# Patient Record
Sex: Female | Born: 1945 | Race: White | Smoking: Former smoker
Health system: Southern US, Community
[De-identification: ages and names within clinical notes are randomized; demographics above are authoritative.]

## PROBLEM LIST (undated history)

## (undated) DIAGNOSIS — F329 Major depressive disorder, single episode, unspecified: Secondary | ICD-10-CM

## (undated) DIAGNOSIS — E7800 Pure hypercholesterolemia, unspecified: Secondary | ICD-10-CM

## (undated) DIAGNOSIS — R569 Unspecified convulsions: Secondary | ICD-10-CM

## (undated) DIAGNOSIS — I639 Cerebral infarction, unspecified: Secondary | ICD-10-CM

## (undated) DIAGNOSIS — I10 Essential (primary) hypertension: Secondary | ICD-10-CM

## (undated) DIAGNOSIS — E119 Type 2 diabetes mellitus without complications: Secondary | ICD-10-CM

## (undated) DIAGNOSIS — F32A Depression, unspecified: Secondary | ICD-10-CM

## (undated) HISTORY — PX: CATARACT EXTRACTION: SUR2

## (undated) HISTORY — PX: ABDOMINAL HYSTERECTOMY: SHX81

## (undated) HISTORY — PX: EYE SURGERY: SHX253

## (undated) HISTORY — DX: Unspecified convulsions: R56.9

## (undated) HISTORY — PX: TOE AMPUTATION: SHX809

## (undated) HISTORY — DX: Pure hypercholesterolemia, unspecified: E78.00

## (undated) HISTORY — DX: Cerebral infarction, unspecified: I63.9

---
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
## 2009-08-07 ENCOUNTER — Other Ambulatory Visit: Admission: RE | Admit: 2009-08-07 | Discharge: 2009-08-07 | Payer: Self-pay | Admitting: Family Medicine

## 2009-12-12 ENCOUNTER — Encounter: Payer: Self-pay | Admitting: Emergency Medicine

## 2009-12-13 ENCOUNTER — Inpatient Hospital Stay (HOSPITAL_COMMUNITY): Admission: EM | Admit: 2009-12-13 | Discharge: 2009-12-14 | Payer: Self-pay | Admitting: Cardiovascular Disease

## 2009-12-14 ENCOUNTER — Ambulatory Visit: Payer: Self-pay | Admitting: Surgery

## 2009-12-14 ENCOUNTER — Encounter (INDEPENDENT_AMBULATORY_CARE_PROVIDER_SITE_OTHER): Payer: Self-pay | Admitting: Cardiovascular Disease

## 2010-01-13 ENCOUNTER — Emergency Department (HOSPITAL_COMMUNITY): Admission: EM | Admit: 2010-01-13 | Discharge: 2010-01-13 | Payer: Self-pay | Admitting: Emergency Medicine

## 2010-03-15 ENCOUNTER — Encounter: Admission: RE | Admit: 2010-03-15 | Discharge: 2010-03-15 | Payer: Self-pay | Admitting: Endocrinology

## 2010-04-24 ENCOUNTER — Encounter
Admission: RE | Admit: 2010-04-24 | Discharge: 2010-05-23 | Payer: Self-pay | Source: Home / Self Care | Attending: Neurology | Admitting: Neurology

## 2010-08-25 LAB — BASIC METABOLIC PANEL
BUN: 19 mg/dL (ref 6–23)
BUN: 30 mg/dL — ABNORMAL HIGH (ref 6–23)
CO2: 26 mEq/L (ref 19–32)
CO2: 30 mEq/L (ref 19–32)
Calcium: 8.8 mg/dL (ref 8.4–10.5)
Calcium: 10.3 mg/dL (ref 8.4–10.5)
Chloride: 109 mEq/L (ref 96–112)
Chloride: 103 mEq/L (ref 96–112)
Creatinine, Ser: 0.92 mg/dL (ref 0.4–1.2)
Creatinine, Ser: 0.93 mg/dL (ref 0.4–1.2)
GFR calc Af Amer: >60 mL/min (ref >60)
GFR calc Af Amer: >60 mL/min (ref >60)
GFR calc non Af Amer: >60 mL/min (ref >60)
GFR calc non Af Amer: >60 mL/min (ref >60)
Glucose, Bld: 144 mg/dL — ABNORMAL HIGH (ref 70–99)
Glucose, Bld: 132 mg/dL — ABNORMAL HIGH (ref 70–99)
Potassium: 3.8 mEq/L (ref 3.5–5.1)
Potassium: 3.5 mEq/L (ref 3.5–5.1)
Sodium: 142 mEq/L (ref 135–145)
Sodium: 142 mEq/L (ref 135–145)

## 2010-08-25 LAB — APTT: aPTT: 27 seconds (ref 24–37)

## 2010-08-25 LAB — DIFFERENTIAL
Basophils Absolute: 0 10*3/uL (ref 0.0–0.1)
Basophils Relative: 0 % (ref 0–1)
Eosinophils Absolute: 0.2 10*3/uL (ref 0.0–0.7)
Eosinophils Relative: 2 % (ref 0–5)
Lymphocytes Relative: 29 % (ref 12–46)
Lymphs Abs: 2.7 10*3/uL (ref 0.7–4.0)
Monocytes Absolute: 0.8 10*3/uL (ref 0.1–1.0)
Monocytes Relative: 8 % (ref 3–12)
Neutro Abs: 5.7 10*3/uL (ref 1.7–7.7)
Neutrophils Relative %: 61 % (ref 43–77)

## 2010-08-25 LAB — CBC
HCT: 35.2 % — ABNORMAL LOW (ref 36.0–46.0)
HCT: 38.6 % (ref 36.0–46.0)
HCT: 43.8 % (ref 36.0–46.0)
Hemoglobin: 11.6 g/dL — ABNORMAL LOW (ref 12.0–15.0)
Hemoglobin: 13.1 g/dL (ref 12.0–15.0)
Hemoglobin: 15.2 g/dL — ABNORMAL HIGH (ref 12.0–15.0)
MCH: 28.7 pg (ref 26.0–34.0)
MCH: 29.2 pg (ref 26.0–34.0)
MCH: 29.2 pg (ref 26.0–34.0)
MCHC: 33.1 g/dL (ref 30.0–36.0)
MCHC: 34 g/dL (ref 30.0–36.0)
MCHC: 34.6 g/dL (ref 30.0–36.0)
MCV: 86.1 fL (ref 78.0–100.0)
MCV: 86.7 fL (ref 78.0–100.0)
MCV: 84.3 fL (ref 78.0–100.0)
Platelets: 182 10*3/uL (ref 150–400)
Platelets: 187 10*3/uL (ref 150–400)
Platelets: 263 10*3/uL (ref 150–400)
RBC: 4.06 MIL/uL (ref 3.87–5.11)
RBC: 4.49 MIL/uL (ref 3.87–5.11)
RBC: 5.19 MIL/uL — ABNORMAL HIGH (ref 3.87–5.11)
RDW: 13.1 % (ref 11.5–15.5)
RDW: 13.6 % (ref 11.5–15.5)
RDW: 13.4 % (ref 11.5–15.5)
WBC: 6.8 10*3/uL (ref 4.0–10.5)
WBC: 7.2 10*3/uL (ref 4.0–10.5)
WBC: 9.4 10*3/uL (ref 4.0–10.5)

## 2010-08-25 LAB — MRSA PCR SCREENING: MRSA by PCR: NEGATIVE

## 2010-08-25 LAB — POCT CARDIAC MARKERS
CKMB, poc: 1.5 ng/mL (ref 1.0–8.0)
Myoglobin, poc: 79.7 ng/mL (ref 12–200)
Troponin i, poc: <0.05 ng/mL (ref 0.00–0.09)

## 2010-08-25 LAB — HEPARIN LEVEL (UNFRACTIONATED)
Heparin Unfractionated: 0.18 IU/mL — ABNORMAL LOW (ref 0.30–0.70)
Heparin Unfractionated: 0.72 IU/mL — ABNORMAL HIGH (ref 0.30–0.70)
Heparin Unfractionated: 0.82 IU/mL — ABNORMAL HIGH (ref 0.30–0.70)

## 2010-08-25 LAB — CK TOTAL AND CKMB (NOT AT ARMC)
CK, MB: 2.6 ng/mL (ref 0.3–4.0)
Relative Index: INVALID (ref 0.0–2.5)
Total CK: 98 U/L (ref 7–177)

## 2010-08-25 LAB — GLUCOSE, CAPILLARY
Glucose-Capillary: 123 mg/dL — ABNORMAL HIGH (ref 70–99)
Glucose-Capillary: 126 mg/dL — ABNORMAL HIGH (ref 70–99)

## 2010-08-25 LAB — PROTIME-INR
INR: 1.05 (ref 0.00–1.49)
Prothrombin Time: 13.6 seconds (ref 11.6–15.2)

## 2010-08-25 LAB — TROPONIN I: Troponin I: 0.02 ng/mL (ref 0.00–0.06)

## 2011-04-21 ENCOUNTER — Ambulatory Visit: Payer: Self-pay | Admitting: Physical Therapy

## 2011-04-22 ENCOUNTER — Ambulatory Visit: Payer: Medicare HMO | Attending: Neurology | Admitting: Physical Therapy

## 2011-04-22 DIAGNOSIS — M542 Cervicalgia: Secondary | ICD-10-CM | POA: Insufficient documentation

## 2011-04-22 DIAGNOSIS — M2569 Stiffness of other specified joint, not elsewhere classified: Secondary | ICD-10-CM | POA: Insufficient documentation

## 2011-04-22 DIAGNOSIS — M25519 Pain in unspecified shoulder: Secondary | ICD-10-CM | POA: Insufficient documentation

## 2011-04-22 DIAGNOSIS — IMO0001 Reserved for inherently not codable concepts without codable children: Secondary | ICD-10-CM | POA: Insufficient documentation

## 2011-04-29 ENCOUNTER — Ambulatory Visit: Payer: Medicare HMO | Admitting: Physical Therapy

## 2011-05-06 ENCOUNTER — Ambulatory Visit: Payer: Medicare HMO | Admitting: Physical Therapy

## 2011-05-08 ENCOUNTER — Ambulatory Visit: Payer: Medicare HMO | Admitting: Physical Therapy

## 2011-05-13 ENCOUNTER — Ambulatory Visit: Payer: Medicare HMO | Attending: Neurology | Admitting: Physical Therapy

## 2011-05-13 DIAGNOSIS — IMO0001 Reserved for inherently not codable concepts without codable children: Secondary | ICD-10-CM | POA: Insufficient documentation

## 2011-05-13 DIAGNOSIS — M2569 Stiffness of other specified joint, not elsewhere classified: Secondary | ICD-10-CM | POA: Insufficient documentation

## 2011-05-13 DIAGNOSIS — M542 Cervicalgia: Secondary | ICD-10-CM | POA: Insufficient documentation

## 2011-05-13 DIAGNOSIS — M25519 Pain in unspecified shoulder: Secondary | ICD-10-CM | POA: Insufficient documentation

## 2011-05-15 ENCOUNTER — Ambulatory Visit: Payer: Medicare HMO | Admitting: Physical Therapy

## 2011-05-20 ENCOUNTER — Ambulatory Visit: Payer: Medicare HMO | Admitting: Physical Therapy

## 2011-05-22 ENCOUNTER — Ambulatory Visit: Payer: Medicare HMO | Admitting: Physical Therapy

## 2011-05-27 ENCOUNTER — Ambulatory Visit: Payer: Medicare HMO | Admitting: Physical Therapy

## 2011-05-29 ENCOUNTER — Ambulatory Visit: Payer: Medicare HMO | Admitting: Physical Therapy

## 2011-06-05 ENCOUNTER — Ambulatory Visit: Payer: Medicare HMO | Admitting: Physical Therapy

## 2011-06-12 ENCOUNTER — Encounter: Payer: Medicare HMO | Admitting: Physical Therapy

## 2019-03-15 ENCOUNTER — Emergency Department (HOSPITAL_COMMUNITY): Payer: Medicare HMO

## 2019-03-15 ENCOUNTER — Emergency Department (HOSPITAL_BASED_OUTPATIENT_CLINIC_OR_DEPARTMENT_OTHER): Payer: Medicare HMO

## 2019-03-15 ENCOUNTER — Encounter (HOSPITAL_BASED_OUTPATIENT_CLINIC_OR_DEPARTMENT_OTHER): Payer: Self-pay | Admitting: Emergency Medicine

## 2019-03-15 ENCOUNTER — Other Ambulatory Visit: Payer: Self-pay

## 2019-03-15 ENCOUNTER — Inpatient Hospital Stay (HOSPITAL_COMMUNITY): Payer: Medicare HMO

## 2019-03-15 ENCOUNTER — Inpatient Hospital Stay (HOSPITAL_BASED_OUTPATIENT_CLINIC_OR_DEPARTMENT_OTHER)
Admission: EM | Admit: 2019-03-15 | Discharge: 2019-03-24 | DRG: 041 | Disposition: A | Discharge status: Skilled Nursing Facility | Payer: Medicare HMO | Attending: Family Medicine | Admitting: Family Medicine

## 2019-03-15 DIAGNOSIS — Z20828 Contact with and (suspected) exposure to other viral communicable diseases: Secondary | ICD-10-CM | POA: Diagnosis present

## 2019-03-15 DIAGNOSIS — I639 Cerebral infarction, unspecified: Secondary | ICD-10-CM

## 2019-03-15 DIAGNOSIS — Z7984 Long term (current) use of oral hypoglycemic drugs: Secondary | ICD-10-CM

## 2019-03-15 DIAGNOSIS — I63512 Cerebral infarction due to unspecified occlusion or stenosis of left middle cerebral artery: Secondary | ICD-10-CM | POA: Diagnosis present

## 2019-03-15 DIAGNOSIS — E1151 Type 2 diabetes mellitus with diabetic peripheral angiopathy without gangrene: Secondary | ICD-10-CM | POA: Diagnosis present

## 2019-03-15 DIAGNOSIS — R29711 NIHSS score 11: Secondary | ICD-10-CM | POA: Diagnosis present

## 2019-03-15 DIAGNOSIS — K5900 Constipation, unspecified: Secondary | ICD-10-CM | POA: Diagnosis present

## 2019-03-15 DIAGNOSIS — R471 Dysarthria and anarthria: Secondary | ICD-10-CM | POA: Diagnosis present

## 2019-03-15 DIAGNOSIS — I161 Hypertensive emergency: Secondary | ICD-10-CM | POA: Diagnosis present

## 2019-03-15 DIAGNOSIS — X501XXA Overexertion from prolonged static or awkward postures, initial encounter: Secondary | ICD-10-CM | POA: Diagnosis not present

## 2019-03-15 DIAGNOSIS — I160 Hypertensive urgency: Secondary | ICD-10-CM | POA: Diagnosis not present

## 2019-03-15 DIAGNOSIS — F329 Major depressive disorder, single episode, unspecified: Secondary | ICD-10-CM | POA: Diagnosis present

## 2019-03-15 DIAGNOSIS — E860 Dehydration: Secondary | ICD-10-CM | POA: Diagnosis present

## 2019-03-15 DIAGNOSIS — G40909 Epilepsy, unspecified, not intractable, without status epilepticus: Secondary | ICD-10-CM | POA: Diagnosis present

## 2019-03-15 DIAGNOSIS — I361 Nonrheumatic tricuspid (valve) insufficiency: Secondary | ICD-10-CM | POA: Diagnosis not present

## 2019-03-15 DIAGNOSIS — R404 Transient alteration of awareness: Secondary | ICD-10-CM | POA: Diagnosis present

## 2019-03-15 DIAGNOSIS — R29810 Facial weakness: Secondary | ICD-10-CM | POA: Diagnosis present

## 2019-03-15 DIAGNOSIS — R4701 Aphasia: Secondary | ICD-10-CM | POA: Diagnosis present

## 2019-03-15 DIAGNOSIS — Z79899 Other long term (current) drug therapy: Secondary | ICD-10-CM

## 2019-03-15 DIAGNOSIS — R9401 Abnormal electroencephalogram [EEG]: Secondary | ICD-10-CM | POA: Diagnosis not present

## 2019-03-15 DIAGNOSIS — I63412 Cerebral infarction due to embolism of left middle cerebral artery: Principal | ICD-10-CM | POA: Diagnosis present

## 2019-03-15 DIAGNOSIS — I447 Left bundle-branch block, unspecified: Secondary | ICD-10-CM | POA: Diagnosis present

## 2019-03-15 DIAGNOSIS — R778 Other specified abnormalities of plasma proteins: Secondary | ICD-10-CM | POA: Diagnosis present

## 2019-03-15 DIAGNOSIS — E1159 Type 2 diabetes mellitus with other circulatory complications: Secondary | ICD-10-CM | POA: Diagnosis not present

## 2019-03-15 DIAGNOSIS — G459 Transient cerebral ischemic attack, unspecified: Secondary | ICD-10-CM

## 2019-03-15 DIAGNOSIS — E1165 Type 2 diabetes mellitus with hyperglycemia: Secondary | ICD-10-CM | POA: Diagnosis present

## 2019-03-15 DIAGNOSIS — M25551 Pain in right hip: Secondary | ICD-10-CM | POA: Diagnosis present

## 2019-03-15 DIAGNOSIS — I351 Nonrheumatic aortic (valve) insufficiency: Secondary | ICD-10-CM | POA: Diagnosis not present

## 2019-03-15 DIAGNOSIS — I10 Essential (primary) hypertension: Secondary | ICD-10-CM | POA: Diagnosis present

## 2019-03-15 DIAGNOSIS — J302 Other seasonal allergic rhinitis: Secondary | ICD-10-CM | POA: Diagnosis present

## 2019-03-15 DIAGNOSIS — M21331 Wrist drop, right wrist: Secondary | ICD-10-CM | POA: Diagnosis present

## 2019-03-15 DIAGNOSIS — Z90710 Acquired absence of both cervix and uterus: Secondary | ICD-10-CM

## 2019-03-15 DIAGNOSIS — Z7289 Other problems related to lifestyle: Secondary | ICD-10-CM

## 2019-03-15 DIAGNOSIS — E119 Type 2 diabetes mellitus without complications: Secondary | ICD-10-CM

## 2019-03-15 DIAGNOSIS — I350 Nonrheumatic aortic (valve) stenosis: Secondary | ICD-10-CM | POA: Diagnosis present

## 2019-03-15 DIAGNOSIS — E876 Hypokalemia: Secondary | ICD-10-CM | POA: Diagnosis present

## 2019-03-15 DIAGNOSIS — G8191 Hemiplegia, unspecified affecting right dominant side: Secondary | ICD-10-CM | POA: Diagnosis present

## 2019-03-15 DIAGNOSIS — E785 Hyperlipidemia, unspecified: Secondary | ICD-10-CM | POA: Diagnosis present

## 2019-03-15 DIAGNOSIS — W19XXXA Unspecified fall, initial encounter: Secondary | ICD-10-CM | POA: Diagnosis present

## 2019-03-15 DIAGNOSIS — Z87891 Personal history of nicotine dependence: Secondary | ICD-10-CM

## 2019-03-15 DIAGNOSIS — I6389 Other cerebral infarction: Secondary | ICD-10-CM | POA: Diagnosis not present

## 2019-03-15 DIAGNOSIS — Z9102 Food additives allergy status: Secondary | ICD-10-CM

## 2019-03-15 DIAGNOSIS — R7989 Other specified abnormal findings of blood chemistry: Secondary | ICD-10-CM | POA: Diagnosis present

## 2019-03-15 DIAGNOSIS — Z885 Allergy status to narcotic agent status: Secondary | ICD-10-CM

## 2019-03-15 DIAGNOSIS — Z888 Allergy status to other drugs, medicaments and biological substances status: Secondary | ICD-10-CM

## 2019-03-15 HISTORY — DX: Type 2 diabetes mellitus without complications: E11.9

## 2019-03-15 HISTORY — DX: Essential (primary) hypertension: I10

## 2019-03-15 HISTORY — DX: Depression, unspecified: F32.A

## 2019-03-15 LAB — BASIC METABOLIC PANEL
Anion gap: 9 (ref 5–15)
BUN: 16 mg/dL (ref 8–23)
CO2: 24 mmol/L (ref 22–32)
Calcium: 9.8 mg/dL (ref 8.9–10.3)
Chloride: 105 mmol/L (ref 98–111)
Creatinine, Ser: 0.88 mg/dL (ref 0.44–1.00)
GFR calc Af Amer: >60 mL/min (ref >60)
GFR calc non Af Amer: >60 mL/min (ref >60)
Glucose, Bld: 156 mg/dL — ABNORMAL HIGH (ref 70–99)
Potassium: 3.4 mmol/L — ABNORMAL LOW (ref 3.5–5.1)
Sodium: 138 mmol/L (ref 135–145)

## 2019-03-15 LAB — URINALYSIS, ROUTINE W REFLEX MICROSCOPIC
Bilirubin Urine: NEGATIVE
Glucose, UA: >=500 mg/dL — AB
Hgb urine dipstick: NEGATIVE
Ketones, ur: 15 mg/dL — AB
Leukocytes,Ua: NEGATIVE
Nitrite: NEGATIVE
Protein, ur: NEGATIVE mg/dL
Specific Gravity, Urine: 1.01 (ref 1.005–1.030)
pH: 6 (ref 5.0–8.0)

## 2019-03-15 LAB — CBC WITH DIFFERENTIAL/PLATELET
Abs Immature Granulocytes: 0.02 10*3/uL (ref 0.00–0.07)
Basophils Absolute: 0.1 10*3/uL (ref 0.0–0.1)
Basophils Relative: 1 %
Eosinophils Absolute: 0.1 10*3/uL (ref 0.0–0.5)
Eosinophils Relative: 1 %
HCT: 42.4 % (ref 36.0–46.0)
Hemoglobin: 14.1 g/dL (ref 12.0–15.0)
Immature Granulocytes: 0 %
Lymphocytes Relative: 22 %
Lymphs Abs: 1.8 10*3/uL (ref 0.7–4.0)
MCH: 28 pg (ref 26.0–34.0)
MCHC: 33.3 g/dL (ref 30.0–36.0)
MCV: 84.1 fL (ref 80.0–100.0)
Monocytes Absolute: 0.6 10*3/uL (ref 0.1–1.0)
Monocytes Relative: 7 %
Neutro Abs: 5.5 10*3/uL (ref 1.7–7.7)
Neutrophils Relative %: 69 %
Platelets: 264 10*3/uL (ref 150–400)
RBC: 5.04 MIL/uL (ref 3.87–5.11)
RDW: 12.8 % (ref 11.5–15.5)
WBC: 8 10*3/uL (ref 4.0–10.5)
nRBC: 0 % (ref 0.0–0.2)

## 2019-03-15 LAB — CBC
HCT: 38.7 % (ref 36.0–46.0)
Hemoglobin: 13.4 g/dL (ref 12.0–15.0)
MCH: 28.9 pg (ref 26.0–34.0)
MCHC: 34.6 g/dL (ref 30.0–36.0)
MCV: 83.6 fL (ref 80.0–100.0)
Platelets: 255 10*3/uL (ref 150–400)
RBC: 4.63 MIL/uL (ref 3.87–5.11)
RDW: 13.1 % (ref 11.5–15.5)
WBC: 7.9 10*3/uL (ref 4.0–10.5)
nRBC: 0 % (ref 0.0–0.2)

## 2019-03-15 LAB — CREATININE, SERUM
Creatinine, Ser: 0.94 mg/dL (ref 0.44–1.00)
GFR calc Af Amer: >60 mL/min (ref >60)
GFR calc non Af Amer: >60 mL/min (ref >60)

## 2019-03-15 LAB — URINALYSIS, MICROSCOPIC (REFLEX)

## 2019-03-15 LAB — TROPONIN I (HIGH SENSITIVITY)
Troponin I (High Sensitivity): 35 ng/L — ABNORMAL HIGH (ref <18)
Troponin I (High Sensitivity): 39 ng/L — ABNORMAL HIGH (ref <18)
Troponin I (High Sensitivity): 38 ng/L — ABNORMAL HIGH (ref <18)

## 2019-03-15 LAB — SARS CORONAVIRUS 2 (TAT 6-24 HRS): SARS Coronavirus 2: NEGATIVE

## 2019-03-15 LAB — HEMOGLOBIN A1C
Hgb A1c MFr Bld: 7.5 % — ABNORMAL HIGH (ref 4.8–5.6)
Mean Plasma Glucose: 168.55 mg/dL

## 2019-03-15 MED ORDER — LORAZEPAM 2 MG/ML IJ SOLN
1.0000 mg | Freq: Once | INTRAMUSCULAR | Status: AC
  Administered 2019-03-15: 18:00:00 1 mg via INTRAVENOUS
  Filled 2019-03-15: qty 1

## 2019-03-15 MED ORDER — ENOXAPARIN SODIUM 40 MG/0.4ML ~~LOC~~ SOLN
40.0000 mg | SUBCUTANEOUS | Status: DC
  Administered 2019-03-16 – 2019-03-21 (×6): 40 mg via SUBCUTANEOUS
  Filled 2019-03-15 (×6): qty 0.4

## 2019-03-15 MED ORDER — INSULIN ASPART 100 UNIT/ML ~~LOC~~ SOLN
0.0000 [IU] | Freq: Three times a day (TID) | SUBCUTANEOUS | Status: DC
  Administered 2019-03-16 – 2019-03-17 (×3): 3 [IU] via SUBCUTANEOUS
  Administered 2019-03-18: 12:00:00 2 [IU] via SUBCUTANEOUS
  Administered 2019-03-18: 3 [IU] via SUBCUTANEOUS
  Administered 2019-03-19: 2 [IU] via SUBCUTANEOUS
  Administered 2019-03-19: 3 [IU] via SUBCUTANEOUS
  Administered 2019-03-20: 2 [IU] via SUBCUTANEOUS
  Administered 2019-03-21: 3 [IU] via SUBCUTANEOUS
  Administered 2019-03-21 – 2019-03-22 (×5): 2 [IU] via SUBCUTANEOUS
  Administered 2019-03-23: 07:00:00 0 [IU] via SUBCUTANEOUS

## 2019-03-15 MED ORDER — ASPIRIN 300 MG RE SUPP: 300.0000 mg | Freq: Every day | RECTAL | Status: DC

## 2019-03-15 MED ORDER — HYDRALAZINE HCL 20 MG/ML IJ SOLN
10.0000 mg | Freq: Once | INTRAMUSCULAR | Status: AC
  Administered 2019-03-15: 10 mg via INTRAVENOUS
  Filled 2019-03-15: qty 1

## 2019-03-15 MED ORDER — SERTRALINE HCL 50 MG PO TABS
50.0000 mg | ORAL_TABLET | Freq: Every day | ORAL | Status: DC
  Administered 2019-03-16 – 2019-03-23 (×8): 50 mg via ORAL
  Filled 2019-03-15 (×8): qty 1

## 2019-03-15 MED ORDER — ACETAMINOPHEN 650 MG RE SUPP: 650.0000 mg | RECTAL | Status: DC | PRN

## 2019-03-15 MED ORDER — ASPIRIN 300 MG RE SUPP
300.0000 mg | Freq: Once | RECTAL | Status: AC
  Administered 2019-03-15: 20:00:00 300 mg via RECTAL
  Filled 2019-03-15: qty 1

## 2019-03-15 MED ORDER — SODIUM CHLORIDE 0.9 % IV SOLN
INTRAVENOUS | Status: DC
  Administered 2019-03-15: 17:00:00 via INTRAVENOUS

## 2019-03-15 MED ORDER — IOHEXOL 350 MG/ML SOLN
100.0000 mL | Freq: Once | INTRAVENOUS | Status: AC | PRN
  Administered 2019-03-15: 100 mL via INTRAVENOUS

## 2019-03-15 MED ORDER — POTASSIUM CHLORIDE 20 MEQ PO PACK
20.0000 meq | PACK | Freq: Once | ORAL | Status: AC
  Administered 2019-03-16: 02:00:00 20 meq via ORAL
  Filled 2019-03-15: qty 1

## 2019-03-15 MED ORDER — ATORVASTATIN CALCIUM 80 MG PO TABS: 80.0000 mg | ORAL_TABLET | Freq: Every day | ORAL | Status: DC

## 2019-03-15 MED ORDER — ACETAMINOPHEN 325 MG PO TABS: 650.0000 mg | ORAL_TABLET | ORAL | Status: DC | PRN

## 2019-03-15 MED ORDER — INSULIN ASPART 100 UNIT/ML ~~LOC~~ SOLN: 0.0000 [IU] | Freq: Every day | SUBCUTANEOUS | Status: DC

## 2019-03-15 MED ORDER — STROKE: EARLY STAGES OF RECOVERY BOOK
Freq: Once | Status: AC
  Administered 2019-03-22: 22:00:00
  Filled 2019-03-15: qty 1

## 2019-03-15 MED ORDER — ACETAMINOPHEN 160 MG/5ML PO SOLN: 650.0000 mg | ORAL | Status: DC | PRN

## 2019-03-15 NOTE — ED Notes (Addendum)
This RN noted to have an acute change in mentation since pt has arrived here. Confirmed change with previous Therapist, sports. Rod Holler, RN noted pt was A/Ox4 at 13:30. This RN noted pt having difficulty forming words and confused. Per daughter pt's last known normal is at was at 08:18 this AM.   Addendum: After further discussion with the daughter pt's last known normal was last PM at 1800

## 2019-03-15 NOTE — ED Notes (Signed)
ED Provider at bedside. 

## 2019-03-15 NOTE — H&P (Addendum)
History and Physical    Rebecca Gallagher BJS:[id number] DOB: Jan 02, 1946 DOA: 03/15/2019  PCP: Hayden Rasmussen, MD  Patient coming from: home  I have personally briefly reviewed patient's old medical records in [place]  Chief Complaint: Altered mental status  HPI: Rebecca Gallagher is a 73 y.o. female with medical history significant of diabetes, hypertension, depression is a transfer from [place] for evaluation of TIA/stroke.  Her initial work-up at [facility] was concerning for possible hypertension emergency as patient was hypertensive upon arrival and her blood pressure was in 200s However EDP reported change in neuro status-neurology on call was consulted who recommended state CTA and perfusion-and patient was transferred to [facility] for further evaluation and management.  Upon my evaluation: Patient reports right-sided weakness which started  suddenly this morning.  She denies fall, head trauma, loss of consciousness, headache, blurry vision, dizziness, lightheadedness, fall, chest pain, shortness of breath, palpitation, nausea, vomiting, fever, chills, diarrhea, constipation, epigastric pain, hematemesis or melena.  She lives with her daughter and she is former smoking, drinks alcohol 2-3 times per week, denies illicit drug use.  She does not use walker or cane at home.  She is compliant with her home medication.  ED Course: Patient's blood pressure found to be elevated.  Evaluated by neurology who recommended full stroke work-up along with EEG.  Review of Systems: As per HPI otherwise negative.    Past Medical History:  Diagnosis Date   Depression    Diabetes mellitus without complication ([place])    Hypertension     Past Surgical History:  Procedure Laterality Date   ABDOMINAL HYSTERECTOMY     CATARACT EXTRACTION     EYE SURGERY     TOE AMPUTATION       reports that she has quit smoking. She has never used smokeless tobacco. She reports current  alcohol use. She reports that she does not use drugs.  Allergies  Allergen Reactions   Hydrocodone-Acetaminophen Palpitations   Pregabalin Palpitations   Valsartan Palpitations   Yellow Dye Itching    YWV:PXTGGY Dye+nisoldipine   Niacin Swelling   Interferons     Beta-1b   Nisoldipine    Losartan Palpitations    No family history on file.  Prior to Admission medications   Medication Sig Start Date End Date Taking? Authorizing Provider  carvedilol (COREG) 6.25 MG tablet TAKE 1 TABLET (6.25 MG) BY MOUTH TWICE A DAY 11/25/18   [provider]  cetirizine (ZYRTEC) 10 MG tablet TAKE 1 TABLET (10 MG) BY MOUTH DAILY FOR ANGIOEDEMA 12/01/18   [provider]  INVOKANA 300 MG TABS tablet Take 150 mg by mouth daily. 01/20/19   [provider]  olmesartan (BENICAR) 40 MG tablet Take 40 mg by mouth daily. 11/30/18   [provider]  omeprazole (PRILOSEC) 20 MG capsule TAKE 1 CAPSULE BY MOUTH EVERY DAY 30 MINUTES BEFORE MORNING MEAL 12/01/18   [provider]  rosuvastatin (CRESTOR) 20 MG tablet Take 20 mg by mouth daily. 11/30/18   [provider]  sertraline (ZOLOFT) 50 MG tablet Take 50 mg by mouth daily. 12/01/18   [provider]    Physical Exam: Vitals:   03/15/19 1623 03/15/19 1700 03/15/19 1730 03/15/19 1800  BP: (!) 154/56 (!) 178/58 (!) 162/62 (!) 187/65  Pulse: 74 75 74 72  Resp: 16 16 16 20   Temp: 98.3 F (36.8 C)     TempSrc: Oral     SpO2: 97% 97%  97% 96%  Weight:      Height:        Constitutional: NAD, calm, comfortable Vitals:   03/15/19 1623 03/15/19 1700 03/15/19 1730 03/15/19 1800  BP: (!) 154/56 (!) 178/58 (!) 162/62 (!) 187/65  Pulse: 74 75 74 72  Resp: 16 16 16 20   Temp: 98.3 F (36.8 C)     TempSrc: Oral     SpO2: 97% 97% 97% 96%  Weight:      Height:       Constitutional: Alert and oriented x3, not in acute distress, has slurred speech  eyes: PERRL, lids and conjunctivae normal ENMT:  Mucous membranes are moist. Posterior pharynx clear of any exudate or lesions.Normal dentition.  Neck: normal, supple, no masses, no thyromegaly Respiratory: clear to auscultation bilaterally, no wheezing, no crackles. Normal respiratory effort. No accessory muscle use.  Cardiovascular: Regular rate and rhythm, no murmurs / rubs / gallops. No extremity edema. 2+ pedal pulses. No carotid bruits.  Abdomen: no tenderness, no masses palpated. No hepatosplenomegaly. Bowel sounds positive.  Musculoskeletal: no clubbing / cyanosis. No joint deformity upper and lower extremities. Good ROM, no contractures. Normal muscle tone.  Skin: no rashes, lesions, ulcers. No induration Neurologic: Power 3 out of 5 in right upper and lower extremity and 5 out of 5 in left upper and lower extremity.  Sensation intact. Psychiatric: Normal judgment and insight. Alert and oriented x 3. Normal mood.    Labs on Admission: I have personally reviewed following labs and imaging studies  CBC: Recent Labs  Lab 03/15/19 1158 03/15/19 1722  WBC 8.0 7.9  NEUTROABS 5.5  --   HGB 14.1 13.4  HCT 42.4 38.7  MCV 84.1 83.6  PLT 264 255   Basic Metabolic Panel: Recent Labs  Lab 03/15/19 1158 03/15/19 1704  NA 138  --   K 3.4*  --   CL 105  --   CO2 24  --   GLUCOSE 156*  --   BUN 16  --   CREATININE 0.88 0.94  CALCIUM 9.8  --    GFR: Estimated Creatinine Clearance: 35.8 mL/min (by C-G formula based on SCr of 0.94 mg/dL). Liver Function Tests: No results for input(s): AST, ALT, ALKPHOS, BILITOT, PROT, ALBUMIN in the last 168 hours. No results for input(s): LIPASE, AMYLASE in the last 168 hours. No results for input(s): AMMONIA in the last 168 hours. Coagulation Profile: No results for input(s): INR, PROTIME in the last 168 hours. Cardiac Enzymes: No results for input(s): CKTOTAL, CKMB, CKMBINDEX, TROPONINI in the last 168 hours. BNP (last 3 results) No results for input(s): PROBNP in the last 8760  hours. HbA1C: Recent Labs    03/15/19 1814  HGBA1C 7.5*   CBG: No results for input(s): GLUCAP in the last 168 hours. Lipid Profile: No results for input(s): CHOL, HDL, LDLCALC, TRIG, CHOLHDL, LDLDIRECT in the last 72 hours. Thyroid Function Tests: No results for input(s): TSH, T4TOTAL, FREET4, T3FREE, THYROIDAB in the last 72 hours. Anemia Panel: No results for input(s): VITAMINB12, FOLATE, FERRITIN, TIBC, IRON, RETICCTPCT in the last 72 hours. Urine analysis:    Component Value Date/Time   COLORURINE YELLOW 03/15/2019 1500   APPEARANCEUR CLEAR 03/15/2019 1500   LABSPEC 1.010 03/15/2019 1500   PHURINE 6.0 03/15/2019 1500   GLUCOSEU >=500 (A) 03/15/2019 1500   HGBUR NEGATIVE 03/15/2019 1500   BILIRUBINUR NEGATIVE 03/15/2019 1500   KETONESUR 15 (A) 03/15/2019 1500   PROTEINUR NEGATIVE 03/15/2019 1500   NITRITE NEGATIVE 03/15/2019 1500  LEUKOCYTESUR NEGATIVE 03/15/2019 1500    Radiological Exams on Admission: Ct Angio Head W Or Wo Contrast  Result Date: 03/15/2019 CLINICAL DATA:  Focal neuro deficit.  Rule out stroke.  Fall. EXAM: CT ANGIOGRAPHY HEAD AND NECK CT PERFUSION BRAIN TECHNIQUE: Multidetector CT imaging of the head and neck was performed using the standard protocol during bolus administration of intravenous contrast. Multiplanar CT image reconstructions and MIPs were obtained to evaluate the vascular anatomy. Carotid stenosis measurements (when applicable) are obtained utilizing NASCET criteria, using the distal internal carotid diameter as the denominator. Multiphase CT imaging of the brain was performed following IV bolus contrast injection. Subsequent parametric perfusion maps were calculated using RAPID software. CONTRAST:  OMNIPAQUE IOHEXOL 350 MG/ML SOLN COMPARISON:  CT head 03/15/2019 FINDINGS: CTA NECK FINDINGS Aortic arch: Standard branching. Imaged portion shows no evidence of aneurysm or dissection. No significant stenosis of the major arch vessel origins.  Atherosclerotic calcification aortic arch and proximal great vessels. Right carotid system: Mild atherosclerotic calcification right carotid bifurcation without significant stenosis Left carotid system: Mild atherosclerotic calcification left carotid bifurcation without significant stenosis Vertebral arteries: Mild stenosis origin of right vertebral artery. Right vertebral artery is patent to the basilar without additional stenosis Left vertebral artery widely patent the basilar without stenosis. Skeleton: Cervical spondylosis.  No acute skeletal abnormality. Other neck: Small thyroid nodules.  No soft tissue mass in the neck. Upper chest: Lung apices clear bilaterally. Review of the MIP images confirms the above findings CTA HEAD FINDINGS Anterior circulation: Extensive atherosclerotic calcification in the cavernous carotid bilaterally causing moderate stenosis bilaterally. Anterior and middle cerebral arteries patent bilaterally. Mild stenosis left MCA bifurcation. Posterior circulation: Both vertebral arteries patent to the basilar. PICA patent bilaterally. Basilar widely patent. Superior cerebellar and posterior cerebral arteries patent bilaterally. Moderate stenosis proximal PCA bilaterally. Venous sinuses: Normal venous enhancement Anatomic variants: None Review of the MIP images confirms the above findings CT Brain Perfusion Findings: ASPECTS: 9 CBF (<30%) Volume: 0mL Perfusion (Tmax>6.0s) volume: 0mL Mismatch Volume: 0mL Infarction Location:None IMPRESSION: 1. CT perfusion negative for acute infarct or ischemia 2. CT head demonstrates hypodensity left occipital parietal lobe suggestive of subacute infarct. 3. Atherosclerotic calcification carotid bifurcation bilaterally without significant stenosis. Mild stenosis origin of right vertebral artery. Left vertebral artery widely patent 4. 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 Mild stenosis left MCA bifurcation 5. Moderate stenosis  posterior cerebral artery bilaterally Electronically Signed   By: Marlan Palau M.D.   On: 03/15/2019 16:47   Dg Chest 1 View  Result Date: 03/15/2019 CLINICAL DATA:  Right hip pain secondary to a fall this morning. Weakness. Hypertension. EXAM: CHEST  1 VIEW COMPARISON:  Chest x-ray dated 12/12/2009 FINDINGS: Heart size and pulmonary vascularity are normal. Aortic atherosclerosis. Lungs are clear. No effusions. No acute bone abnormality. IMPRESSION: 1. No acute abnormalities. 2. Aortic atherosclerosis. Electronically Signed   By: Francene Boyers M.D.   On: 03/15/2019 12:01   Ct Head Wo Contrast  Result Date: 03/15/2019 CLINICAL DATA:  Status post fall. EXAM: CT HEAD WITHOUT CONTRAST CT CERVICAL SPINE WITHOUT CONTRAST TECHNIQUE: Multidetector CT imaging of the head and cervical spine was performed following the standard protocol without intravenous contrast. Multiplanar CT image reconstructions of the cervical spine were also generated. COMPARISON:  December 14, 2009. FINDINGS: CT HEAD FINDINGS Brain: Mild chronic ischemic white matter disease is noted. No mass effect or midline shift is noted. Ventricular size is within normal limits. There is no evidence of mass lesion, hemorrhage  or acute infarction. Vascular: No hyperdense vessel or unexpected calcification. Skull: Normal. Negative for fracture or focal lesion. Sinuses/Orbits: No acute finding. Other: None. CT CERVICAL SPINE FINDINGS Alignment: Minimal grade 1 retrolisthesis of C4-5 is noted. Skull base and vertebrae: No acute fracture. No primary bone lesion or focal pathologic process. Soft tissues and spinal canal: No prevertebral fluid or swelling. No visible canal hematoma. Disc levels: Moderate degenerative disc disease is noted at C3-4, C4-5 and C5-6. Upper chest: Negative. Other: Mild degenerative changes are seen involving posterior facet joints bilaterally. IMPRESSION: Mild chronic ischemic white matter disease. No acute intracranial abnormality  seen. Moderate multilevel degenerative disc disease. No acute abnormality seen in the cervical spine. Electronically Signed   By: Lupita RaiderJames  Green Jr M.D.   On: 03/15/2019 11:45   Ct Angio Neck W Or Wo Contrast  Result Date: 03/15/2019 CLINICAL DATA:  Focal neuro deficit.  Rule out stroke.  Fall. EXAM: CT ANGIOGRAPHY HEAD AND NECK CT PERFUSION BRAIN TECHNIQUE: Multidetector CT imaging of the head and neck was performed using the standard protocol during bolus administration of intravenous contrast. Multiplanar CT image reconstructions and MIPs were obtained to evaluate the vascular anatomy. Carotid stenosis measurements (when applicable) are obtained utilizing NASCET criteria, using the distal internal carotid diameter as the denominator. Multiphase CT imaging of the brain was performed following IV bolus contrast injection. Subsequent parametric perfusion maps were calculated using RAPID software. CONTRAST:  100mL OMNIPAQUE IOHEXOL 350 MG/ML SOLN COMPARISON:  CT head 03/15/2019 FINDINGS: CTA NECK FINDINGS Aortic arch: Standard branching. Imaged portion shows no evidence of aneurysm or dissection. No significant stenosis of the major arch vessel origins. Atherosclerotic calcification aortic arch and proximal great vessels. Right carotid system: Mild atherosclerotic calcification right carotid bifurcation without significant stenosis Left carotid system: Mild atherosclerotic calcification left carotid bifurcation without significant stenosis Vertebral arteries: Mild stenosis origin of right vertebral artery. Right vertebral artery is patent to the basilar without additional stenosis Left vertebral artery widely patent the basilar without stenosis. Skeleton: Cervical spondylosis.  No acute skeletal abnormality. Other neck: Small thyroid nodules.  No soft tissue mass in the neck. Upper chest: Lung apices clear bilaterally. Review of the MIP images confirms the above findings CTA HEAD FINDINGS Anterior circulation:  Extensive atherosclerotic calcification in the cavernous carotid bilaterally causing moderate stenosis bilaterally. Anterior and middle cerebral arteries patent bilaterally. Mild stenosis left MCA bifurcation. Posterior circulation: Both vertebral arteries patent to the basilar. PICA patent bilaterally. Basilar widely patent. Superior cerebellar and posterior cerebral arteries patent bilaterally. Moderate stenosis proximal PCA bilaterally. Venous sinuses: Normal venous enhancement Anatomic variants: None Review of the MIP images confirms the above findings CT Brain Perfusion Findings: ASPECTS: 9 CBF (<30%) Volume: 0mL Perfusion (Tmax>6.0s) volume: 0mL Mismatch Volume: 0mL Infarction Location:None IMPRESSION: 1. CT perfusion negative for acute infarct or ischemia 2. CT head demonstrates hypodensity left occipital parietal lobe suggestive of subacute infarct. 3. Atherosclerotic calcification carotid bifurcation bilaterally without significant stenosis. Mild stenosis origin of right vertebral artery. Left vertebral artery widely patent 4. 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 Mild stenosis left MCA bifurcation 5. Moderate stenosis posterior cerebral artery bilaterally Electronically Signed   By: Marlan Palauharles  Clark M.D.   On: 03/15/2019 16:47   Ct Cervical Spine Wo Contrast  Result Date: 03/15/2019 CLINICAL DATA:  Status post fall. EXAM: CT HEAD WITHOUT CONTRAST CT CERVICAL SPINE WITHOUT CONTRAST TECHNIQUE: Multidetector CT imaging of the head and cervical spine was performed following the standard protocol without intravenous contrast. Multiplanar CT  image reconstructions of the cervical spine were also generated. COMPARISON:  December 14, 2009. FINDINGS: CT HEAD FINDINGS Brain: Mild chronic ischemic white matter disease is noted. No mass effect or midline shift is noted. Ventricular size is within normal limits. There is no evidence of mass lesion, hemorrhage or acute infarction. Vascular: No  hyperdense vessel or unexpected calcification. Skull: Normal. Negative for fracture or focal lesion. Sinuses/Orbits: No acute finding. Other: None. CT CERVICAL SPINE FINDINGS Alignment: Minimal grade 1 retrolisthesis of C4-5 is noted. Skull base and vertebrae: No acute fracture. No primary bone lesion or focal pathologic process. Soft tissues and spinal canal: No prevertebral fluid or swelling. No visible canal hematoma. Disc levels: Moderate degenerative disc disease is noted at C3-4, C4-5 and C5-6. Upper chest: Negative. Other: Mild degenerative changes are seen involving posterior facet joints bilaterally. IMPRESSION: Mild chronic ischemic white matter disease. No acute intracranial abnormality seen. Moderate multilevel degenerative disc disease. No acute abnormality seen in the cervical spine. Electronically Signed   By: Lupita Raider M.D.   On: 03/15/2019 11:45   Mr Brain Wo Contrast  Result Date: 03/15/2019 CLINICAL DATA:  Altered level of consciousness.  Stroke. EXAM: MRI HEAD WITHOUT CONTRAST TECHNIQUE: Multiplanar, multiecho pulse sequences of the brain and surrounding structures were obtained without intravenous contrast. COMPARISON:  CT head and CTA head 03/15/2019 FINDINGS: Brain: Acute infarct left parietal lobe. Cortical infarct in the left posterior parietal lobe extending into the high left parietal lobe with numerous small areas of cortical infarct extending into the left frontal lobe. No other acute infarct. Negative for hemorrhage or mass. Chronic microvascular ischemic changes in the white matter. Ventricle size normal. Vascular: Normal arterial flow voids. Skull and upper cervical spine: Negative Sinuses/Orbits: Mild mucosal edema paranasal sinuses. Bilateral cataract surgery Other: None IMPRESSION: Acute infarct left posterior MCA territory without hemorrhage Chronic microvascular ischemic change in the white matter. Electronically Signed   By: Marlan Palau M.D.   On: 03/15/2019 19:32     Ct Hip Right Wo Contrast  Result Date: 03/15/2019 CLINICAL DATA:  Right hip pain.  Twisting injury. EXAM: CT OF THE RIGHT HIP WITHOUT CONTRAST TECHNIQUE: Multidetector CT imaging of the right hip was performed according to the standard protocol. Multiplanar CT image reconstructions were also generated. COMPARISON:  Radiographs, same date. FINDINGS: The right hip is normally located. Mild age related degenerative changes. No acute fracture or evidence of AVN. Mild insertional spurring changes noted along the greater trochanter. The acetabulum is intact. The visualized right hemipelvis is intact. The right SI joint appears normal. No significant muscular abnormality.  No obvious tear or hematoma. No significant intrapelvic abnormalities are identified. Moderate vascular calcifications. No inguinal mass or hernia. IMPRESSION: 1. Mild right hip joint degenerative changes but no fracture or AVN. 2. The visualized right hemipelvis is intact. Electronically Signed   By: Rudie Meyer M.D.   On: 03/15/2019 12:30   Ct Cerebral Perfusion W Contrast  Result Date: 03/15/2019 CLINICAL DATA:  Focal neuro deficit.  Rule out stroke.  Fall. EXAM: CT ANGIOGRAPHY HEAD AND NECK CT PERFUSION BRAIN TECHNIQUE: Multidetector CT imaging of the head and neck was performed using the standard protocol during bolus administration of intravenous contrast. Multiplanar CT image reconstructions and MIPs were obtained to evaluate the vascular anatomy. Carotid stenosis measurements (when applicable) are obtained utilizing NASCET criteria, using the distal internal carotid diameter as the denominator. Multiphase CT imaging of the brain was performed following IV bolus contrast injection. Subsequent parametric perfusion maps  were calculated using RAPID software. CONTRAST:  OMNIPAQUE IOHEXOL 350 MG/ML SOLN COMPARISON:  CT head 03/15/2019 FINDINGS: CTA NECK FINDINGS Aortic arch: Standard branching. Imaged portion shows no evidence of  aneurysm or dissection. No significant stenosis of the major arch vessel origins. Atherosclerotic calcification aortic arch and proximal great vessels. Right carotid system: Mild atherosclerotic calcification right carotid bifurcation without significant stenosis Left carotid system: Mild atherosclerotic calcification left carotid bifurcation without significant stenosis Vertebral arteries: Mild stenosis origin of right vertebral artery. Right vertebral artery is patent to the basilar without additional stenosis Left vertebral artery widely patent the basilar without stenosis. Skeleton: Cervical spondylosis.  No acute skeletal abnormality. Other neck: Small thyroid nodules.  No soft tissue mass in the neck. Upper chest: Lung apices clear bilaterally. Review of the MIP images confirms the above findings CTA HEAD FINDINGS Anterior circulation: Extensive atherosclerotic calcification in the cavernous carotid bilaterally causing moderate stenosis bilaterally. Anterior and middle cerebral arteries patent bilaterally. Mild stenosis left MCA bifurcation. Posterior circulation: Both vertebral arteries patent to the basilar. PICA patent bilaterally. Basilar widely patent. Superior cerebellar and posterior cerebral arteries patent bilaterally. Moderate stenosis proximal PCA bilaterally. Venous sinuses: Normal venous enhancement Anatomic variants: None Review of the MIP images confirms the above findings CT Brain Perfusion Findings: ASPECTS: 9 CBF (<30%) Volume: 0mL Perfusion (Tmax>6.0s) volume: 0mL Mismatch Volume: 0mL Infarction Location:None IMPRESSION: 1. CT perfusion negative for acute infarct or ischemia 2. CT head demonstrates hypodensity left occipital parietal lobe suggestive of subacute infarct. 3. Atherosclerotic calcification carotid bifurcation bilaterally without significant stenosis. Mild stenosis origin of right vertebral artery. Left vertebral artery widely patent 4. Moderate calcific stenosis of the cavernous  carotid bilaterally. Mild stenosis left MCA bifurcation 5. Moderate stenosis posterior cerebral artery bilaterally Electronically Signed   By: Marlan Palau M.D.   On: 03/15/2019 16:47   Dg Hip Unilat With Pelvis 2-3 Views Right  Result Date: 03/15/2019 CLINICAL DATA:  Right hip pain secondary to a fall this morning. EXAM: DG HIP (WITH OR WITHOUT PELVIS) 2-3V RIGHT COMPARISON:  None. FINDINGS: On the AP view of the pelvis there is suggestion of a hairline fracture of the right femoral neck. This is not visible on the other views in this could represent an overlying soft tissue fold. CT scan of the right hip recommended for further evaluation. IMPRESSION: 1. Possible hairline fracture of the right femoral neck. CT scan of the right hip recommended for further evaluation. 2. No other significant abnormalities of the right hip are seen. Electronically Signed   By: Francene Boyers M.D.   On: 03/15/2019 12:00   Ct Head Code Stroke Wo Contrast  Result Date: 03/15/2019 CLINICAL DATA:  Code stroke.  Stroke.  Fall. EXAM: CT HEAD WITHOUT CONTRAST TECHNIQUE: Contiguous axial images were obtained from the base of the skull through the vertex without intravenous contrast. COMPARISON:  CT head earlier today, proximally 5 hours previously FINDINGS: Brain: Ill-defined hypodensity left occipital parietal cortex is similar the prior study and most compatible with subacute infarct. Generalized atrophy. Bilateral white matter disease most compatible with chronic microvascular ischemia. Small chronic infarct right thalamus. Bilateral basal ganglia chronic small infarcts. Negative for hemorrhage or mass. Vascular: Negative for hyperdense vessel Skull: Negative Sinuses/Orbits: Mild mucosal edema paranasal sinuses. Bilateral ocular surgery. Other: None ASPECTS ([place] Stroke Program Early CT Score) - Ganglionic level infarction (caudate, lentiform nuclei, internal capsule, insula, M1-M3 cortex): 6 - Supraganglionic infarction  (M4-M6 cortex): 3 Total score (0-10 with 10 being normal): 9 IMPRESSION:  1. Hypodensity left occipital parietal cortex is unchanged from earlier today. The appearance is most consistent with subacute infarct. Recommend MRI to confirm. 2. Moderate to advanced chronic microvascular ischemic change. No acute hemorrhage 3. ASPECTS is 9 4. These results were called by telephone at the time of interpretation on 03/15/2019 at 4:35 pm to provider [facility] , who verbally acknowledged these results. Electronically Signed   By: Marlan Palau M.D.   On: 03/15/2019 16:37    EKG: Sinus rhythm, left bundle branch block, no acute ST-T wave changes noted.  Assessment/Plan Principal Problem:   Ischemic stroke (HCC) Active Problems:   TIA (transient ischemic attack)   Hypertension   Hyperlipidemia   Diabetes mellitus (HCC)   Hypokalemia   Dehydration   Elevated troponin   Acute infarction of left posterior MCA: -CT head without contrast, CT cerebral perfusion, CT angios neck, head, MRI brain without contrast as above -admit forTelemetry monitoring -Stroke protocol -Allow for permissive hypertension for the first 24-48h - only treat PRN if SBP >220 mmHg or diastolic blood pressure >120. Blood pressures can be gradually normalized to SBP<140 upon discharge. -Ordered transthoracic echo-pending -Lipid Panel and A1C -Frequent neuro checks -Aspirin 300 mg per rectal once, atorvastatin 80 mg once daily -Risk factor modification -Consult Neurology-appreciate help -We will keep her n.p.o. until she passes bedside swallow evaluation. -PT/OT eval, Speech consult -Ordered EEG as per neuro recommendation.  Hypertension: Elevated -We will hold home blood pressure medicine at this time to allow permissive hypertension -Monitor blood pressure closely.  Hyperlipidemia: Check lipid panel -Start on atorvastatin 80 mg once daily.  Diabetes mellitus: Check A1c -Hold Invokana and start on sliding scale  insulin -Monitor blood sugar closely  Hypokalemia: Likely secondary to dehydration -Replenish and repeat BMP tomorrow a.m.  Elevated troponin: -Likely secondary to uncontrolled hypertension -Patient denies ACS symptoms. -We will trend troponin.  On telemetry.  Dehydration: UA is positive for ketones -Continue IV fluids.  Depression: Continue Zoloft.  DVT prophylaxis: TED/SCD Code Status: Full code Family Communication: None present at bedside.  Plan of care discussed with patient in length and he verbalized understanding and agreed with it. Disposition Plan: TBD Consults called: neurology Admission status: inpatient.  Ollen Bowl MD [facility] Pager [phone]  If 7PM-7AM, please contact night-coverage [email or web address] Password [email or web address]  03/15/2019, 8:18 PM

## 2019-03-15 NOTE — Consult Note (Signed)
Neurology Consultation  Reason for Consult: Concern for stroke Referring Physician: Dr. Vallery Ridge  CC: Confusion  History is obtained from: Chart review  HPI: Rebecca Gallagher is a 73 y.o. female past medical history of diabetes, hypertension, depression presenting to [place] for evaluation after a presumed fall.  Initial work-up concerning for possible hypertensive emergency in the ER, admission to hospitalist planned at [facility]. While awaiting admission, per the ED provider, change in neuro status with patient speech becoming garbled.  She was also hypertensive on arrival with blood pressure in the 200s and hypertensive emergency was of concern for differentials as well. They called me at [facility] and I recommended doing a telemedicine neurology consultation. Telemedicine neurology consultation and concern for possible large vessel occlusion because of speech issues-mixed aphasia.  Stat CTA and perfusion was recommended which could be performed at [facility] and patient was transferred. Patient ran as a code stroke in the emergency room. Outside the window for IV TPA due to last known normal being sometime yesterday. No intervention because of no emergent LVO or perfusion deficits on advanced CTA and perfusion imaging  Spoke with daughter over phone. She sent patient a text this AM and did not get a response. Friends found confused in home. Daughter said she was on a chair, they didn't see her fall or anything. She woke up feeling unwell. In past many years ago, her electrolytes were low and she needed ICU admission for that many years ago.  No history of any heart behavioral bizarre behaviorproblems in the past.   LKW: 6 PM on 03/14/2019, and although there is some question on how accurate that might be tpa given?: no, outside the window Premorbid modified Rankin scale (mRS): 0  ROS:  Unable to obtain due to altered mental status.   Past Medical  History:  Diagnosis Date  . Depression   . Diabetes mellitus without complication ([place])   . Hypertension     No family history on file. Unable to obtain due to AMS/aophasia  Social History:   reports that she has quit smoking. She has never used smokeless tobacco. She reports current alcohol use. She reports that she does not use drugs.  Medications No current facility-administered medications for this encounter.   Current Outpatient Medications:  .  carvedilol (COREG) 6.25 MG tablet, TAKE 1 TABLET (6.25 MG) BY MOUTH TWICE A DAY, Disp: , Rfl:  .  cetirizine (ZYRTEC) 10 MG tablet, TAKE 1 TABLET (10 MG) BY MOUTH DAILY FOR ANGIOEDEMA, Disp: , Rfl:  .  INVOKANA 300 MG TABS tablet, Take 150 mg by mouth daily., Disp: , Rfl:  .  olmesartan (BENICAR) 40 MG tablet, Take 40 mg by mouth daily., Disp: , Rfl:  .  omeprazole (PRILOSEC) 20 MG capsule, TAKE 1 CAPSULE BY MOUTH EVERY DAY 30 MINUTES BEFORE MORNING MEAL, Disp: , Rfl:  .  rosuvastatin (CRESTOR) 20 MG tablet, Take 20 mg by mouth daily., Disp: , Rfl:  .  sertraline (ZOLOFT) 50 MG tablet, Take 50 mg by mouth daily., Disp: , Rfl:   Exam: Current vital signs: BP (!) 154/56   Pulse 74   Temp 98.3 F (36.8 C) (Oral)   Resp 16   Ht 4\' 8"  (1.422 m)   Wt 51.7 kg   SpO2 97%   BMI 25.56 kg/m  Vital signs in last 24 hours: Temp:  [98.3 F (36.8 C)-98.5 F (36.9 C)] 98.3 F (36.8 C) (10/06 1623) Pulse Rate:  [  62-82] 74 (10/06 1623) Resp:  [16-17] 16 (10/06 1623) BP: (141-228)/(49-94) 154/56 (10/06 1623) SpO2:  [94 %-99 %] 97 % (10/06 1623) Weight:  [51.7 kg] 51.7 kg (10/06 1059)  GENERAL: Awake, alert in NAD HEENT: - Normocephalic and atraumatic, dry mm, no LN++, no Thyromegally LUNGS - Clear to auscultation bilaterally with no wheezes CV - S1S2 RRR, no m/r/g, equal pulses bilaterally. ABDOMEN - Soft, nontender, nondistended with normoactive BS Ext: warm, well perfused, intact peripheral pulses, no edema NEURO:  Mental Status:  Awake alert oriented x1 Language: speech has a stuttering quality to it.impaired naming comprehension and repetition. Cranial nerves: Pupils equal round react light, face sensation intact, face is better, visual fields full, tongue and palate midline. Motor exam: There is right upper extremity drift, right lower extremity drift with both of them being no more than 4/5.  Left side no drift. Sensory exam intact light touch Coordination: No dysmetria NIHSS 6  Labs I have reviewed labs in epic and the results pertinent to this consultation are:  CBC    Component Value Date/Time   WBC 8.0 03/15/2019 1158   RBC 5.04 03/15/2019 1158   HGB 14.1 03/15/2019 1158   HCT 42.4 03/15/2019 1158   PLT 264 03/15/2019 1158   MCV 84.1 03/15/2019 1158   MCH 28.0 03/15/2019 1158   MCHC 33.3 03/15/2019 1158   RDW 12.8 03/15/2019 1158   LYMPHSABS 1.8 03/15/2019 1158   MONOABS 0.6 03/15/2019 1158   EOSABS 0.1 03/15/2019 1158   BASOSABS 0.1 03/15/2019 1158    CMP     Component Value Date/Time   NA 138 03/15/2019 1158   K 3.4 (L) 03/15/2019 1158   CL 105 03/15/2019 1158   CO2 24 03/15/2019 1158   GLUCOSE 156 (H) 03/15/2019 1158   BUN 16 03/15/2019 1158   CREATININE 0.94 03/15/2019 1704   CALCIUM 9.8 03/15/2019 1158   GFRNONAA >60 03/15/2019 1704   GFRAA >60 03/15/2019 1704  Urinalysis unremarkable for infection.  Has excess glucose  Imaging I have reviewed the images obtained: CT[facility] read as normal but there might be an evolving left parieto-occipital infarct. [facility] CT at [facility] with no acute changes other than the possible left parieto-occipital infarct. CTA with no LVO.  No perfusion deficit on CT perfusion imaging  Assessment: [age] year old woman with above past medical history presented for evaluation of a presumed fall-although the daughter said nobody witnessed the fall but somehow that came into her history from EMS and was noted to have a sudden  onset of change in her speech while waiting to be transferred to the hospital from the freestanding ER. It is unclear when her exact last known normal is but based on multiple conversations, it is deemed to be 6 PM on 03/14/2019 making her outside the window for IV TPA. Advanced imaging could not locate a clot hence not a candidate for EVT. 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 Also consider seizures as an etiology because there might of been reports of waxing waning mentation.  Impression: Evaluate for stroke Evaluate for possible underlying seizures  Recommendations: -Admit to hospitalist -Telemetry monitoring -Allow for permissive hypertension for the first 24-48h - only treat PRN if SBP >220 mmHg. Blood pressures can be gradually normalized to SBP<140 upon discharge. -MRI brain without contrast -Echocardiogram -HgbA1c, fasting lipid panel -Frequent neuro checks -Prophylactic therapy-Antiplatelet med: Aspirin - dose 325mg  PO or 300mg  PR -Atorvastatin 80 mg PO daily -Risk factor modification -PT consult, OT  consult, Speech consult -If Afib found on telemetry, will need anticoagulation. Decision pending imaging and stroke team rounding. -Routine EEG  Please page stroke NP/PA/MD (listed on AMION)  from 8am-4 pm as this patient will be followed by the stroke team at this point.  -- Milon Dikes, MD [facility] Pager: [phone] If 7pm to 7am, please call on call as listed on AMION.

## 2019-03-15 NOTE — ED Notes (Signed)
Pt unable to void at this time. 

## 2019-03-15 NOTE — ED Notes (Signed)
[facility] reports waiting on truck to pick up patient

## 2019-03-15 NOTE — ED Notes (Signed)
Daughter states confusion and mumbling is not pt's baseline since 9:15 this am.

## 2019-03-15 NOTE — ED Notes (Signed)
Patient arrived to [facility] ED from [facility] ED for MRI via [place], Report patient fell in the driveway. Sent to this ED for further testing. [facility] states LKW unknown yesterday between 1800 to today 1330. Stated per daughter she has not been acting right since yesterday at 1800 by slow to respond. [facility] reported garbled speech and right leg weakness. Neurologist called code stroke upon arrival.

## 2019-03-15 NOTE — ED Triage Notes (Addendum)
Low right sided back pain and right leg pain that started this morning when she twisted while taking the trash out. Denies falling.  At this time pt sts the pain is gone. Per Daughter, the person who first found here after this episode said that she was delusional for a period of time but by the time EMS got there she was better.  Daughter is suspicious pt has a TIA or something.  Sts she is somewhat sluggish now as compared to her normal.

## 2019-03-15 NOTE — ED Notes (Signed)
ED Provider at bedside, Dr. Pfeiffer.  

## 2019-03-15 NOTE — ED Notes (Signed)
As pt was being loaded onto [facility] stretcher this RN noted pt had some improvement in aphasia and orientation at this time.

## 2019-03-15 NOTE — Progress Notes (Signed)
Patient with h/o HTN; DM; and depression presenting to [facility] with a fall causing leg pain, also noted to have confusion.  Housesitting for neighbors, fell while taking out the trash.  Neighbors found her confused, disoriented - which is not her usual baseline.  Still seems a bit slow to answer questions.  Hip xray ?fx but CT negative.  HTN in the ER, given hydralazine.  ?Hypertensive urgency vs. PRES vs. TIA.  HS troponin 35, no repeat yet.  Likely needs admission to progressive unit.  Since unable to obtain a bed in a reasonably short period of time, would consider ER:ER transfer to enable sooner MRI.  They will work to make that happen.    Carlyon Shadow, M.D.

## 2019-03-15 NOTE — ED Provider Notes (Signed)
Medical screening examination/treatment/procedure(s) were conducted as a shared visit with non-physician practitioner(s) and myself.  I personally evaluated the patient during the encounter.    Patient typically lives with her daughter who is present with the patient.  She was housesitting for a neighbor who lives close by.  She went out in the morning to take out the trash and let out the cat.  Patient stated she is got up fairly bad pain in her right hip.  She reports that she was coming back from putting out the trash, she had to take a lot of rest breaks.  At one point, she apparently stumbled into a bush.  A neighbor noted that the patient seemed unwell and the patient's daughter was contacted.  The patient denies she actually fell.  There was no report of fall to the ground.  There was report of stumbling into a bush.  Patient daughter reports that when she got there she seemed unusually confused but did not notice any focal weakness or dysfunction of an extremity.  She seemed mentally sluggish compared to normal.  By EMS transport she seemed improved.  On first examination, patient was alert and answering questions appropriately.  Speech clear with normal content.  Patient follows all commands for grip strength and movement of extremities.  No localizing dysfunction.  Continues to complain of discomfort to the right hip.  No deformity.  Studies have been done and no fractures have been identified.  Patient has normal neurovascular exam of the lower extremities.  Plan was in place for admission and transfer for mental status change improved and persistent hip pain.  Patient did not present with any localizing stroke symptoms.  Patient had been by herself overnight and in the morning.  After first examined diagnostic evaluation still concern for possible stroke unknown last well versus TIA.  Plan was to proceed with MRI at [facility].  Also, patient had significant hypertension and I had suspicion for  hypertensive encephalopathy as patient's etiology of "sluggish" mental status without localizing motor or visual dysfunction.  Prior to transport, patient had an acute change in mental status and neurologic function.  On reexamination, patient had significantly impaired speech, difficulty following commands, appearance of slight left facial droop.  At that time concern for acute CVA, likely stuttering in nature based on earlier history.  Tele neurology consult ordered and code stroke activated.  Discussed with Dr. Malen Gauze as well.  Recommendation from telemetry neurology was for emergent CT angiogram with perfusion for suspected LVO stroke pattern.  Patient is maintaining airway without difficulty.  Repeat blood pressures are 130Q systolic.  Patient will be transferred to [facility] facility for further diagnostic and possibly interventional management.   Charlesetta Shanks, MD 03/18/19 [phone]

## 2019-03-15 NOTE — Consult Note (Addendum)
[facility] TeleNeurology Consult Services   Date of Service:   03/15/2019 14:43:54  Impression:     .  Left Hemispheric Infarct  Comments/Sign-Out: Mixed aphasia, out of window for Iv alteplase. STAT CTA and CTP, perfusion not available at current facility. STAT transfer  Mechanism of Stroke: Not Clear  Metrics: Last Known Well: 03/14/2019 18:00:00 [facility] Notification Time: 03/15/2019 14:43:54 Arrival Time: 03/15/2019 10:23:00 Stamp Time: 03/15/2019 14:43:54 Time First Login Attempt: 03/15/2019 14:46:06 Video Start Time: 03/15/2019 14:46:33  NIHSS Start Assessment Time: 03/15/2019 14:48:00 Patient is not a candidate for Alteplase/Activase. Patient was not deemed candidate for Alteplase/Activase thrombolytics because of Last Well Known Above 4.5 Hours. Video End Time: 03/15/2019 14:55:30  CT head showed no acute hemorrhage or acute core infarct.  Clinical Presentation is Suggestive of Large Vessel Occlusive Disease, Recommendations are as Follows  CTA Head and Neck. CT Perfusion. Advanced Imaging to be Reviewed by ED Provider and NIR.   ED Physician notified of diagnostic impression and management plan on 03/15/2019 14:55:32  Our recommendations are outlined below.  Recommendations:     .  Activate Stroke Protocol Admission/Order Set     .  Stroke/Telemetry Floor     .  Neuro Checks     .  Bedside Swallow Eval     .  DVT Prophylaxis     .  IV Fluids, Normal Saline     .  Head of Bed 30 Degrees     .  Euglycemia and Avoid Hyperthermia (PRN Acetaminophen)  Routine Consultation with Inhouse Neurology for Follow up Care  Sign Out:     .  Discussed with Emergency Department Provider    ------------------------------------------------------------------------------  History of Present Illness: Patient is a 73 year old Female. Right handed.  Patient was brought by EMS after presumed fall.   Altered, impaired speech. LKN yesterday  10/5 1800, text interaction with family this AM 8:18 not normal, presented earlier to ER and had further deterioration around 13:30. On ASA, no prior stroke. At baseline independent for ADLs. Daughter at baseline provides history. Was house sitting this past weekend.    Past Medical History:     . Hypertension     . Diabetes Mellitus   Antiplatelet use: aspirin  Examination: BP(228/94), Pulse(82), Blood Glucose(156) 1A: Level of Consciousness - Alert; keenly responsive + 0 1B: Ask Month and Age - 1 Question Right + 1 1C: Blink Eyes & Squeeze Hands - Performs 1 Task + 1 2: Test Horizontal Extraocular Movements - Normal + 0 3: Test Visual Fields - No Visual Loss + 0 4: Test Facial Palsy (Use Grimace if Obtunded) - Minor paralysis (flat nasolabial fold, smile asymmetry) + 1 5A: Test Left Arm Motor Drift - No Drift for 10 Seconds + 0 5B: Test Right Arm Motor Drift - Drift, but doesn't hit bed + 1 6A: Test Left Leg Motor Drift - Some Effort Against Gravity + 2 6B: Test Right Leg Motor Drift - Some Effort Against Gravity + 2 7: Test Limb Ataxia (FNF/Heel-Shin) - No Ataxia + 0 8: Test Sensation - Normal; No sensory loss + 0 9: Test Language/Aphasia - Severe Aphasia: Fragmentary Expression, Inference Needed, Cannot Identify Materials + 2 10: Test Dysarthria - Mild-Moderate Dysarthria: Slurring but can be understood + 1 11: Test Extinction/Inattention - No abnormality + 0  NIHSS Score: 11  Patient/Family was informed the Neurology Consult would happen via TeleHealth consult by way of interactive audio and video telecommunications and consented to receiving care  in this manner.   Due to the immediate potential for life-threatening deterioration due to underlying acute neurologic illness, I spent 25 minutes providing critical care. This time includes time for face to face visit via telemedicine, review of medical records, imaging studies and discussion of findings with providers, the patient  and/or family.   Dr Lita Mains   [facility] [phone]   Case [id number]

## 2019-03-15 NOTE — ED Notes (Signed)
Attempted to call report to charge nurse at [facility] and was given the phone number of the receiving nurse to call.  I called that number but the nurse that is getting this pt is not there yet so I was asked to call back.

## 2019-03-15 NOTE — ED Provider Notes (Signed)
[facility] EMERGENCY DEPARTMENT Provider Note   CSN: [id number] Arrival date & time: 03/15/19  1023     History   Chief Complaint Chief Complaint  Patient presents with   Leg Pain    HPI Rebecca Gallagher is a 73 y.o. female.     73 year old female brought in by EMS with daughter at bedside, past medical history of depression, hypertension, diabetes.  Patient states that she was taking out the trash when she twisted and fell into a bush.  Patient states that she had to take many rest breaks due to pain in her right leg.  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  Patient's daughter states that she is not at baseline at this time, seems sluggish.     Past Medical History:  Diagnosis Date   Depression    Diabetes mellitus without complication (HCC)    Hypertension     There are no active problems to display for this patient.   Past Surgical History:  Procedure Laterality Date   ABDOMINAL HYSTERECTOMY     CATARACT EXTRACTION     EYE SURGERY     TOE AMPUTATION       OB History   No obstetric history on file.      Home Medications    Prior to Admission medications   Medication Sig Start Date End Date Taking? Authorizing Provider  carvedilol (COREG) 6.25 MG tablet TAKE 1 TABLET (6.25 MG) BY MOUTH TWICE A DAY 11/25/18   [provider]  cetirizine (ZYRTEC) 10 MG tablet TAKE 1 TABLET (10 MG) BY MOUTH DAILY FOR ANGIOEDEMA 12/01/18   [provider]  INVOKANA 300 MG TABS tablet Take 150 mg by mouth daily. 01/20/19   [provider]  olmesartan (BENICAR) 40 MG tablet Take 40 mg by mouth daily. 11/30/18   [provider]  omeprazole (PRILOSEC) 20 MG capsule TAKE 1 CAPSULE BY MOUTH EVERY DAY 30 MINUTES BEFORE MORNING MEAL 12/01/18   [provider]  rosuvastatin (CRESTOR) 20 MG tablet Take 20 mg by  mouth daily. 11/30/18   [provider]  sertraline (ZOLOFT) 50 MG tablet Take 50 mg by mouth daily. 12/01/18   [provider]    Family History No family history on file.  Social History Social History   Tobacco Use   Smoking status: Former Smoker   Smokeless tobacco: Never Used  Substance Use Topics   Alcohol use: Yes    Comment: occ   Drug use: Never     Allergies   Losartan   Review of Systems Review of Systems  Constitutional: Negative for fever.  Respiratory: Negative for shortness of breath.   Cardiovascular: Negative for chest pain.  Gastrointestinal: Negative for abdominal pain, nausea and vomiting.  Genitourinary: Negative for difficulty urinating and dysuria.  Musculoskeletal: Positive for arthralgias and gait problem.  Skin: Negative for rash and wound.  Allergic/Immunologic: Positive for immunocompromised state.  Neurological: Negative for dizziness, weakness and numbness.  Psychiatric/Behavioral: Positive for confusion.  All other systems reviewed and are negative.    Physical Exam Updated Vital Signs BP (!) 205/57 (BP Location: Right Arm)    Pulse 64    Temp 98.3 F (36.8 C) (Oral)    Resp 16    Ht 4\' 8"  (1.422 m)    Wt 51.7 kg    SpO2 98%    BMI 25.56 kg/m  Physical Exam Vitals signs and nursing note reviewed.  Constitutional:      Appearance: Normal appearance.  HENT:     Head: Normocephalic and atraumatic.     Mouth/Throat:     Mouth: Mucous membranes are moist.  Eyes:     Extraocular Movements: Extraocular movements intact.     Pupils: Pupils are equal, round, and reactive to light.  Neck:     Musculoskeletal: Neck supple. No muscular tenderness.  Cardiovascular:     Rate and Rhythm: Normal rate and regular rhythm.     Pulses: Normal pulses.     Heart sounds: Normal heart sounds. No murmur.  Pulmonary:     Effort: Pulmonary effort is normal.     Breath sounds: Normal breath sounds.  Chest:     Chest wall: No  tenderness.  Abdominal:     Palpations: Abdomen is soft.     Tenderness: There is no abdominal tenderness.  Musculoskeletal:        General: Tenderness present.     Right hip: She exhibits decreased range of motion and tenderness.     Right lower leg: No edema.     Left lower leg: No edema.       Legs:  Skin:    General: Skin is warm and dry.  Neurological:     Mental Status: She is alert and oriented to person, place, and time.     GCS: GCS eye subscore is 4. GCS verbal subscore is 5. GCS motor subscore is 6.     Cranial Nerves: No cranial nerve deficit or facial asymmetry.     Sensory: Sensation is intact. No sensory deficit.     Motor: No pronator drift.     Comments: On arm strength exam, slight right wrist drop, no pronator drift. Leg strength equal at feet, right leg unable to elevate right leg secondary to right hip pain.  Psychiatric:        Behavior: Behavior normal.      ED Treatments / Results  Labs (all labs ordered are listed, but only abnormal results are displayed) Labs Reviewed  BASIC METABOLIC PANEL - Abnormal; Notable for the following components:      Result Value   Potassium 3.4 (*)    Glucose, Bld 156 (*)    All other components within normal limits  TROPONIN I (HIGH SENSITIVITY) - Abnormal; Notable for the following components:   Troponin I (High Sensitivity) 35 (*)    All other components within normal limits  TROPONIN I (HIGH SENSITIVITY) - Abnormal; Notable for the following components:   Troponin I (High Sensitivity) 39 (*)    All other components within normal limits  SARS CORONAVIRUS 2 (TAT 6-24 HRS)  CBC WITH DIFFERENTIAL/PLATELET  URINALYSIS, ROUTINE W REFLEX MICROSCOPIC    EKG None  Radiology Dg Chest 1 View  Result Date: 03/15/2019 CLINICAL DATA:  Right hip pain secondary to a fall this morning. Weakness. Hypertension. EXAM: CHEST  1 VIEW COMPARISON:  Chest x-ray dated 12/12/2009 FINDINGS: Heart size and pulmonary vascularity are  normal. Aortic atherosclerosis. Lungs are clear. No effusions. No acute bone abnormality. IMPRESSION: 1. No acute abnormalities. 2. Aortic atherosclerosis. Electronically Signed   By: Francene BoyersJames  Maxwell M.D.   On: 03/15/2019 12:01   Ct Head Wo Contrast  Result Date: 03/15/2019 CLINICAL DATA:  Status post fall. EXAM: CT HEAD WITHOUT CONTRAST CT CERVICAL SPINE WITHOUT CONTRAST TECHNIQUE: Multidetector CT imaging of the head and cervical spine was performed following the standard protocol  without intravenous contrast. Multiplanar CT image reconstructions of the cervical spine were also generated. COMPARISON:  December 14, 2009. FINDINGS: CT HEAD FINDINGS Brain: Mild chronic ischemic white matter disease is noted. No mass effect or midline shift is noted. Ventricular size is within normal limits. There is no evidence of mass lesion, hemorrhage or acute infarction. Vascular: No hyperdense vessel or unexpected calcification. Skull: Normal. Negative for fracture or focal lesion. Sinuses/Orbits: No acute finding. Other: None. CT CERVICAL SPINE FINDINGS Alignment: Minimal grade 1 retrolisthesis of C4-5 is noted. Skull base and vertebrae: No acute fracture. No primary bone lesion or focal pathologic process. Soft tissues and spinal canal: No prevertebral fluid or swelling. No visible canal hematoma. Disc levels: Moderate degenerative disc disease is noted at C3-4, C4-5 and C5-6. Upper chest: Negative. Other: Mild degenerative changes are seen involving posterior facet joints bilaterally. IMPRESSION: Mild chronic ischemic white matter disease. No acute intracranial abnormality seen. Moderate multilevel degenerative disc disease. No acute abnormality seen in the cervical spine. Electronically Signed   By: Marijo Conception M.D.   On: 03/15/2019 11:45   Ct Cervical Spine Wo Contrast  Result Date: 03/15/2019 CLINICAL DATA:  Status post fall. EXAM: CT HEAD WITHOUT CONTRAST CT CERVICAL SPINE WITHOUT CONTRAST TECHNIQUE: Multidetector  CT imaging of the head and cervical spine was performed following the standard protocol without intravenous contrast. Multiplanar CT image reconstructions of the cervical spine were also generated. COMPARISON:  December 14, 2009. FINDINGS: CT HEAD FINDINGS Brain: Mild chronic ischemic white matter disease is noted. No mass effect or midline shift is noted. Ventricular size is within normal limits. There is no evidence of mass lesion, hemorrhage or acute infarction. Vascular: No hyperdense vessel or unexpected calcification. Skull: Normal. Negative for fracture or focal lesion. Sinuses/Orbits: No acute finding. Other: None. CT CERVICAL SPINE FINDINGS Alignment: Minimal grade 1 retrolisthesis of C4-5 is noted. Skull base and vertebrae: No acute fracture. No primary bone lesion or focal pathologic process. Soft tissues and spinal canal: No prevertebral fluid or swelling. No visible canal hematoma. Disc levels: Moderate degenerative disc disease is noted at C3-4, C4-5 and C5-6. Upper chest: Negative. Other: Mild degenerative changes are seen involving posterior facet joints bilaterally. IMPRESSION: Mild chronic ischemic white matter disease. No acute intracranial abnormality seen. Moderate multilevel degenerative disc disease. No acute abnormality seen in the cervical spine. Electronically Signed   By: Marijo Conception M.D.   On: 03/15/2019 11:45   Ct Hip Right Wo Contrast  Result Date: 03/15/2019 CLINICAL DATA:  Right hip pain.  Twisting injury. EXAM: CT OF THE RIGHT HIP WITHOUT CONTRAST TECHNIQUE: Multidetector CT imaging of the right hip was performed according to the standard protocol. Multiplanar CT image reconstructions were also generated. COMPARISON:  Radiographs, same date. FINDINGS: The right hip is normally located. Mild age related degenerative changes. No acute fracture or evidence of AVN. Mild insertional spurring changes noted along the greater trochanter. The acetabulum is intact. The visualized right  hemipelvis is intact. The right SI joint appears normal. No significant muscular abnormality.  No obvious tear or hematoma. No significant intrapelvic abnormalities are identified. Moderate vascular calcifications. No inguinal mass or hernia. IMPRESSION: 1. Mild right hip joint degenerative changes but no fracture or AVN. 2. The visualized right hemipelvis is intact. Electronically Signed   By: Marijo Sanes M.D.   On: 03/15/2019 12:30   Dg Hip Unilat With Pelvis 2-3 Views Right  Result Date: 03/15/2019 CLINICAL DATA:  Right hip pain secondary to a fall  this morning. EXAM: DG HIP (WITH OR WITHOUT PELVIS) 2-3V RIGHT COMPARISON:  None. FINDINGS: On the AP view of the pelvis there is suggestion of a hairline fracture of the right femoral neck. This is not visible on the other views in this could represent an overlying soft tissue fold. CT scan of the right hip recommended for further evaluation. IMPRESSION: 1. Possible hairline fracture of the right femoral neck. CT scan of the right hip recommended for further evaluation. 2. No other significant abnormalities of the right hip are seen. Electronically Signed   By: Francene Boyers M.D.   On: 03/15/2019 12:00    Procedures .Critical Care Performed by: Jeannie Fend, PA-C Authorized by: Jeannie Fend, PA-C   Critical care provider statement:    Critical care time (minutes):  45   Critical care was time spent personally by me on the following activities:  Discussions with consultants, evaluation of patient's response to treatment, examination of patient, ordering and performing treatments and interventions, ordering and review of laboratory studies, ordering and review of radiographic studies, pulse oximetry, re-evaluation of patient's condition, obtaining history from patient or surrogate and review of old charts   (including critical care time)  Medications Ordered in ED Medications  hydrALAZINE (APRESOLINE) injection 10 mg (10 mg Intravenous Given  03/15/19 1247)     Initial Impression / Assessment and Plan / ED Course  I have reviewed the triage vital signs and the nursing notes.  Pertinent labs & imaging results that were available during my care of the patient were reviewed by me and considered in my medical decision making (see chart for details).  Clinical Course as of Mar 14 1350  Tue Mar 15, 2019  1338 73yo female brought in by EMS with daughter at bedside. 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 Patient was taking out the trash and letting the cat out this morning when she twisted and fell into a bush. Patient denies falling to the ground, states she had to take several stops/breaks along the way to the trash bin afterwards. Patient was found by family from the home, seemed confused, did not know how to operate her cell phone. Daughter at bedside states patient does not seem to be at baseline, is slower to respond than usual.  On exam, patient is alert, gives oriented responses and follows commands. Patient has pain over her right hip and pain with log roll of the right hip. No pronator drift, does have weakness of the right wrist compared to left. No other focal neuro findings.  CXR is unremarkable, CT head without acute findings, CT c-spine without acute findings (ordered for concern for fall vs fall into bush). XR hip with concern for hairline fracture, follow up CT of the hip is negative for fracture.  EKG with non specific ST changes, no prior for comparrison, initial troponin 35, repeat 39. Patient was seen by Dr. Donnald Garre, ER attending, recommends IV hydralazine, consider TIA vs hypertensive urgency vs PRES.  Patient's blood pressure is elevated 228/94 initially, given Hydralazine and improved to 141/49 currently.  Case discussed with Dr. Helene Kelp with hospitalist service at [facility], plan is to admit to medical progressive bed, a bed is not available at this time so patient will be transferred ED to ED  for MRI brain while awaiting bed. Dr. Criss Alvine, ER attending at [facility] is aware of plan of care.    [LM]    Clinical Course User Index [LM] Eulah Pont,  Gerome Apley, PA-C      Final Clinical Impressions(s) / ED Diagnoses   Final diagnoses:  Hypertensive urgency  Transient alteration of awareness  Hip pain, acute, right    ED Discharge Orders    None       Jeannie Fend, PA-C 03/15/19 1351    Arby Barrette, MD 03/18/19 [phone]

## 2019-03-16 ENCOUNTER — Inpatient Hospital Stay (HOSPITAL_COMMUNITY): Payer: Medicare HMO

## 2019-03-16 DIAGNOSIS — I639 Cerebral infarction, unspecified: Secondary | ICD-10-CM

## 2019-03-16 DIAGNOSIS — I361 Nonrheumatic tricuspid (valve) insufficiency: Secondary | ICD-10-CM

## 2019-03-16 DIAGNOSIS — I160 Hypertensive urgency: Secondary | ICD-10-CM

## 2019-03-16 DIAGNOSIS — I351 Nonrheumatic aortic (valve) insufficiency: Secondary | ICD-10-CM

## 2019-03-16 DIAGNOSIS — G40909 Epilepsy, unspecified, not intractable, without status epilepticus: Secondary | ICD-10-CM

## 2019-03-16 LAB — GLUCOSE, CAPILLARY
Glucose-Capillary: 142 mg/dL — ABNORMAL HIGH (ref 70–99)
Glucose-Capillary: 157 mg/dL — ABNORMAL HIGH (ref 70–99)
Glucose-Capillary: 168 mg/dL — ABNORMAL HIGH (ref 70–99)
Glucose-Capillary: 89 mg/dL (ref 70–99)

## 2019-03-16 LAB — ECHOCARDIOGRAM COMPLETE
Height: 56 in
Weight: 1824 oz

## 2019-03-16 LAB — CBG MONITORING, ED
Glucose-Capillary: 80 mg/dL (ref 70–99)
Glucose-Capillary: 85 mg/dL (ref 70–99)
Glucose-Capillary: 98 mg/dL (ref 70–99)

## 2019-03-16 LAB — LIPID PANEL
Cholesterol: 216 mg/dL — ABNORMAL HIGH (ref 0–200)
HDL: 51 mg/dL (ref >40)
LDL Cholesterol: 144 mg/dL — ABNORMAL HIGH (ref 0–99)
Total CHOL/HDL Ratio: 4.2 RATIO
Triglycerides: 106 mg/dL (ref <150)
VLDL: 21 mg/dL (ref 0–40)

## 2019-03-16 MED ORDER — ASPIRIN EC 81 MG PO TBEC
81.0000 mg | DELAYED_RELEASE_TABLET | Freq: Every day | ORAL | Status: DC
  Administered 2019-03-17 – 2019-03-24 (×8): 81 mg via ORAL
  Filled 2019-03-16 (×8): qty 1

## 2019-03-16 MED ORDER — TRAMADOL HCL 50 MG PO TABS
50.0000 mg | ORAL_TABLET | Freq: Four times a day (QID) | ORAL | Status: DC | PRN
  Administered 2019-03-16 – 2019-03-18 (×4): 50 mg via ORAL
  Filled 2019-03-16 (×5): qty 1

## 2019-03-16 MED ORDER — CLOPIDOGREL BISULFATE 75 MG PO TABS
75.0000 mg | ORAL_TABLET | Freq: Every day | ORAL | Status: DC
  Administered 2019-03-16 – 2019-03-24 (×9): 75 mg via ORAL
  Filled 2019-03-16 (×9): qty 1

## 2019-03-16 MED ORDER — LEVETIRACETAM 500 MG PO TABS
500.0000 mg | ORAL_TABLET | Freq: Two times a day (BID) | ORAL | Status: DC
  Administered 2019-03-16 – 2019-03-24 (×17): 500 mg via ORAL
  Filled 2019-03-16 (×17): qty 1

## 2019-03-16 MED ORDER — ASPIRIN 325 MG PO TABS
325.0000 mg | ORAL_TABLET | Freq: Every day | ORAL | Status: DC
  Administered 2019-03-16: 325 mg via ORAL
  Filled 2019-03-16: qty 1

## 2019-03-16 MED ORDER — ATORVASTATIN CALCIUM 40 MG PO TABS
40.0000 mg | ORAL_TABLET | Freq: Every day | ORAL | Status: DC
  Administered 2019-03-16 – 2019-03-23 (×8): 40 mg via ORAL
  Filled 2019-03-16 (×8): qty 1

## 2019-03-16 NOTE — Progress Notes (Signed)
PROGRESS NOTE  Felisha Claytor SKA:[id number] DOB: 11-Apr-1946 DOA: 03/15/2019 PCP: No primary care provider on file.  Brief History   Charnetta Wulff is a 73 y.o. female with medical history significant of diabetes, hypertension, depression is a transfer from [place] for evaluation of TIA/stroke.  Her initial work-up at [facility] was concerning for possible hypertension emergency as patient was hypertensive upon arrival and her blood pressure was in 200s However EDP reported change in neuro status-neurology on call was consulted who recommended state CTA and perfusion-and patient was transferred to [facility] for further evaluation and management.  Upon my evaluation: Patient reports right-sided weakness which started  suddenly this morning.  She denies fall, head trauma, loss of consciousness, headache, blurry vision, dizziness, lightheadedness, fall, chest pain, shortness of breath, palpitation, nausea, vomiting, fever, chills, diarrhea, constipation, epigastric pain, hematemesis or melena.  She lives with her daughter and she is former smoking, drinks alcohol 2-3 times per week, denies illicit drug use.  She does not use walker or cane at home.  She is compliant with her home medication.  ED Course: Patient's blood pressure found to be elevated.  Evaluated by neurology who recommended full stroke work-up along with EEG.  The patient is admitted to a telemetry bed to undergo a stroke work up. MR of the brain was obtained that demonstrated acute infarct of the left posterior MCA territory without hemorrhage. It also demonstrated chronic microvascular ischemic change in the white matter. CTA of the head and neck has been obtained that demonstrated subacute infarct of the left occipital parietal lobe, Atherosclerotic calcification of the carotid bifurcation bilaterally without significant stenosis. There is mild stenosis of the right vertebral artery. The left vertebral artery is widely  patent. 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 There is mild stenosis in the left MCA bifurcation. There is moderate stenosis of the posterior cerebral artery bilaterally. CT perfusion was negatiuve for acute infarct or ischemia.  Echocardiogram demonstrated EF of 20-35% with diastolic dysfunction and LVH. No intracardiac source of embolus is identified. There is moderate aortic stenosis.  The patient is being evaluated for inpatient rehab.  Consultants   Neurology  Procedures   EEG - pending  Antibiotics   Anti-infectives (From admission, onward)   None      Subjective  The patient is resting comfortably. No new complaints. Speech is very limited due to dysarthria.   Objective   Vitals:  Vitals:   03/16/19 0828 03/16/19 1147  BP: (!) 178/56 (!) 154/53  Pulse: 70 71  Resp: 18 18  Temp: 98.7 F (37.1 C) 98.2 F (36.8 C)  SpO2: 97% 98%   Exam:  Constitutional:   The patient is awake, alert, and oriented x 3. No acute distress. Eyes:   EOMBI, PERRLA, no scleral icterus or injection. Respiratory:   No increased work of breathing.  No wheezes, rales, or rhonchi  No tactile fremitus Cardiovascular:   Regular rate and rhythm  No ectopy or gallups.  There is a 3/5 systolic ejection murmur with radiation to carotids bilaterally.  No lateral PMI. No thrills. Abdomen:   Abdomen is soft, non-tender, non-distended  No hernias, masses, or organomegaly  Normoactive bowel sounds.  Musculoskeletal:   No cyanosis, clubbing, or edema Skin:   No rashes, lesions, ulcers  palpation of skin: no induration or nodules Neurologic:   Right upper extremity weakness. Expressive aphasia. Right sided paresthesia. There is still mild right facial droop.  I have personally  reviewed the following:   Today's Data   Vitals, lipid panel, hemoglobin A1c  Imaging   CTA head and neck, CT cerebral perfusion, MRI  Cardiology Data    Echocardiogram   Scheduled Meds:   stroke: mapping our early stages of recovery book   Does not apply Once   [START ON 03/17/2019] aspirin EC  81 mg Oral Daily   atorvastatin  40 mg Oral q1800   clopidogrel  75 mg Oral Daily   enoxaparin (LOVENOX) injection  40 mg Subcutaneous Q24H   insulin aspart  0-15 Units Subcutaneous TID WC   insulin aspart  0-5 Units Subcutaneous QHS   levETIRAcetam  500 mg Oral BID   sertraline  50 mg Oral QHS   Continuous Infusions:  Principal Problem:   Ischemic stroke (HCC) Active Problems:   TIA (transient ischemic attack)   Hypertension   Hyperlipidemia   Diabetes mellitus (HCC)   Hypokalemia   Dehydration   Elevated troponin   LOS: 1 day   A & P  Acute infarction of left posterior MCA: Pt is being monitored on telemetry. Neurology has been consulted. Stroke work up is in place. MR of the brain was obtained that demonstrated acute infarct of the left posterior MCA territory without hemorrhage. It also demonstrated chronic microvascular ischemic change in the white matter. CTA of the head and neck has been obtained that demonstrated subacute infarct of the left occipital parietal lobe, Atherosclerotic calcification of the carotid bifurcation bilaterally without significant stenosis. There is mild stenosis of the right vertebral artery. The left vertebral artery is widely patent. 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 There is mild stenosis in the left MCA bifurcation. There is moderate stenosis of the posterior cerebral artery bilaterally. CT perfusion was negatiuve for acute infarct or ischemia.  Echocardiogram demonstrated EF of 60-65% with diastolic dysfunction and LVH. No intracardiac source of embolus is identified. There is moderate aortic stenosis.  Home antihypertensives have been held for the current time. Allow for permissive hypertension for the first 24-48h - only treat PRN if SBP >220 mmHg or  diastolic blood pressure >120. Blood pressures can be gradually normalized to SBP<140 upon discharge.  The patient has been started on Aspirin 300 mg per rectal once, atorvastatin 80 mg once daily.  Hyperlipidemia as determined by lipid panel: She as been started on atorvastatin 80 mg daily.  SLP has been consulted for swallow evaluation.  Results of EEG are pending.  The patient is being evaluated for inpatient rehab.  Hypertension: Elevated, but we are allowing  permissive hypertension for the first 24-48h - only treat PRN if SBP >220 mmHg or diastolic blood pressure >120. Blood pressures can be gradually normalized to SBP<140 upon discharge. Monitor.  Hyperlipidemia: Check lipid panel. Start on atorvastatin 80 mg once daily.  Diabetes mellitus: Hemoglobin is 7.5. The patient's glucoses are being followed with FSBS and SSI. Invokana is being held.  Hypokalemia: Likely secondary to dehydration. Supplement and monitor.  Elevated troponin: Likely secondary to uncontrolled hypertension. No chest pain. Troponins flat as trended over time. EKG demonstrates left bundle branch block. Monitor telemetry.  Dehydration: UA is positive for ketones. Continue IV fluids.  Depression: Continue Zoloft.  I have seen and examined this patient myself. I have spent 35 minutes in her evaluation and plan.  DVT prophylaxis: TED/SCD Code Status: Full code Family Communication: None present at bedside.  Plan of care discussed with patient in length and he verbalized understanding and agreed  with it. Disposition Plan: TBD  Addalyne Vandehei, DO [facility] Direct contact: see [email or web address]  7PM-7AM contact night coverage as above 03/16/2019, 3:30 PM  LOS: 1 day

## 2019-03-16 NOTE — Progress Notes (Signed)
  Echocardiogram 2D Echocardiogram has been performed.  Darlina Sicilian M 03/16/2019, 2:25 PM

## 2019-03-16 NOTE — ED Notes (Signed)
CBG: 80 

## 2019-03-16 NOTE — Progress Notes (Signed)
Rehab Admissions Coordinator Note:  Per PT recommendation, this patient was screened by Raechel Ache for appropriateness for an Inpatient Acute Rehab Consult.  At this time, we are recommending Inpatient Rehab consult. AC will contact MD to request order.   Raechel Ache 03/16/2019, 2:41 PM  I can be reached at [phone].

## 2019-03-16 NOTE — H&P (View-Only) (Signed)
° ° °ELECTROPHYSIOLOGY CONSULT NOTE  °Patient ID: Rebecca Gallagher °MRN: [id number], DOB/AGE: 73/23/1947  ° °Admit date: 03/15/2019 °Date of Consult: 03/17/2019 ° °Primary Physician: No primary care provider on file. °Primary Cardiologist: No primary care provider on file.  °Primary Electrophysiologist: New to Dr. Kamari Bilek °Reason for Consultation: Cryptogenic stroke; recommendations regarding Implantable Loop Recorder ° °History of Present Illness °EP has been asked to evaluate Rebecca Gallagher for placement of an implantable loop recorder to monitor for atrial fibrillation by Dr Xu.  The patient was admitted on 03/15/2019 with right hemiparesis, hemiparesthesia, and mixed aphasia  They first developed symptoms while at home.  Imaging demonstrated hypodensity in L occipital parietal cortex.  They have undergone workup for stroke including echocardiogram and CTA Head and Neck which showed bilateral cavernous moderate stenosis.  The patient has been monitored on telemetry which has demonstrated sinus rhythm with no arrhythmias.  Inpatient stroke work-up will not require a TEE per Neurology.  ° °Echocardiogram this admission demonstrated EF 60-65%, no apparent thrombus, and LA of 2.4 cm.  Lab work is reviewed. ° °Prior to admission, the patient denies chest pain, shortness of breath, dizziness, palpitations, or syncope.  They are recovering from their stroke with plans to attend CIR  at discharge. ° °Past Medical History:  °Diagnosis Date  °• Depression   °• Diabetes mellitus without complication (HCC)   °• Hypertension   °  ° °Surgical History:  °Past Surgical History:  °Procedure Laterality Date  °• ABDOMINAL HYSTERECTOMY    °• CATARACT EXTRACTION    °• EYE SURGERY    °• TOE AMPUTATION    °  ° °Medications Prior to Admission  °Medication Sig Dispense Refill Last Dose  °• Carboxymethylcellulose Sodium (EYE DROPS OP) Place 2 drops into both eyes daily.   03/14/2019 at Unknown time  °• carvedilol (COREG) 6.25 MG tablet Take 6.25 mg by  mouth 2 (two) times daily.    03/15/2019 at 0915  °• cetirizine (ZYRTEC) 10 MG tablet Take 10 mg by mouth daily. For angioedema   03/15/2019 at Unknown time  °• INVOKANA 100 MG TABS tablet Take 150 mg by mouth daily. Before the first meal of the day   03/15/2019 at Unknown time  °• olmesartan (BENICAR) 40 MG tablet Take 40 mg by mouth daily.   03/15/2019 at Unknown time  °• omeprazole (PRILOSEC) 20 MG capsule Take 20 mg by mouth daily. 30 minutes before morning meal   03/15/2019 at Unknown time  °• rosuvastatin (CRESTOR) 20 MG tablet Take 20 mg by mouth daily.   03/15/2019 at Unknown time  °• sertraline (ZOLOFT) 50 MG tablet Take 50 mg by mouth at bedtime.    03/14/2019 at Unknown time  ° ° °Inpatient Medications:  °•  stroke: mapping our early stages of recovery book   Does not apply Once  °• aspirin EC  81 mg Oral Daily  °• atorvastatin  40 mg Oral q1800  °• clopidogrel  75 mg Oral Daily  °• enoxaparin (LOVENOX) injection  40 mg Subcutaneous Q24H  °• insulin aspart  0-15 Units Subcutaneous TID WC  °• insulin aspart  0-5 Units Subcutaneous QHS  °• levETIRAcetam  500 mg Oral BID  °• sertraline  50 mg Oral QHS  ° ° °Allergies:  °Allergies  °Allergen Reactions  °• Hydrocodone-Acetaminophen Palpitations  °• Pregabalin Palpitations  °• Valsartan Palpitations  °• Yellow Dye Itching  °  Kdc:yellow Dye+nisoldipine  °• Niacin Swelling  °• Interferons   °  Beta-1b  °•   Nisoldipine   °• Losartan Palpitations  ° ° °Social History  ° °Socioeconomic History  °• Marital status: Single  °  Spouse name: Not on file  °• Number of children: Not on file  °• Years of education: Not on file  °• Highest education level: Not on file  °Occupational History  °• Not on file  °Social Needs  °• Financial resource strain: Not on file  °• Food insecurity  °  Worry: Not on file  °  Inability: Not on file  °• Transportation needs  °  Medical: Not on file  °  Non-medical: Not on file  °Tobacco Use  °• Smoking status: Former Smoker  °• Smokeless tobacco:  Never Used  °Substance and Sexual Activity  °• Alcohol use: Yes  °  Comment: occ  °• Drug use: Never  °• Sexual activity: Not on file  °Lifestyle  °• Physical activity  °  Days per week: Not on file  °  Minutes per session: Not on file  °• Stress: Not on file  °Relationships  °• Social connections  °  Talks on phone: Not on file  °  Gets together: Not on file  °  Attends religious service: Not on file  °  Active member of club or organization: Not on file  °  Attends meetings of clubs or organizations: Not on file  °  Relationship status: Not on file  °• Intimate partner violence  °  Fear of current or ex partner: Not on file  °  Emotionally abused: Not on file  °  Physically abused: Not on file  °  Forced sexual activity: Not on file  °Other Topics Concern  °• Not on file  °Social History Narrative  °• Not on file  °  ° °No family history on file.  ° ° °Review of Systems: °All other systems reviewed and are otherwise negative except as noted above. ° °Physical Exam: °Vitals:  ° 03/16/19 2000 03/16/19 2259 03/17/19 0350 03/17/19 0751  °BP: (!) 118/39 (!) 133/44 (!) 135/52 (!) 175/67  °Pulse: (!) 58 62 (!) 56 62  °Resp: 15 16 15 17  °Temp: 98.5 °F (36.9 °C) 98.3 °F (36.8 °C) 98.6 °F (37 °C) 98.4 °F (36.9 °C)  °TempSrc: Oral Oral Oral Oral  °SpO2: 97% 98% 95% 98%  °Weight:      °Height:      ° ° °GEN- The patient is well appearing, alert and oriented x 3 today.   °Head- normocephalic, atraumatic °Eyes-  Sclera clear, conjunctiva pink °Ears- hearing intact °Oropharynx- clear °Neck- supple °Lungs- Clear to ausculation bilaterally, normal work of breathing °Heart- Regular rate and rhythm, no murmurs, rubs or gallops  °GI- soft, NT, ND, + BS °Extremities- no clubbing, cyanosis, or edema °MS- no significant deformity or atrophy °Skin- no rash or lesion °Psych- euthymic mood, full affect ° ° °Labs: °  °Lab Results  °Component Value Date  ° WBC 7.9 03/15/2019  ° HGB 13.4 03/15/2019  ° HCT 38.7 03/15/2019  ° MCV 83.6  03/15/2019  ° PLT 255 03/15/2019  °  °Recent Labs  °Lab 03/15/19 °1158 03/15/19 °1704  °NA 138  --   °K 3.4*  --   °CL 105  --   °CO2 24  --   °BUN 16  --   °CREATININE 0.88 0.94  °CALCIUM 9.8  --   °GLUCOSE 156*  --   ° °  °Radiology/Studies: Ct Angio Head W Or Wo Contrast ° °Result   Date: 03/15/2019 °CLINICAL DATA:  Focal neuro deficit.  Rule out stroke.  Fall. EXAM: CT ANGIOGRAPHY HEAD AND NECK CT PERFUSION BRAIN TECHNIQUE: Multidetector CT imaging of the head and neck was performed using the standard protocol during bolus administration of intravenous contrast. Multiplanar CT image reconstructions and MIPs were obtained to evaluate the vascular anatomy. Carotid stenosis measurements (when applicable) are obtained utilizing NASCET criteria, using the distal internal carotid diameter as the denominator. Multiphase CT imaging of the brain was performed following IV bolus contrast injection. Subsequent parametric perfusion maps were calculated using RAPID software. CONTRAST:  100mL OMNIPAQUE IOHEXOL 350 MG/ML SOLN COMPARISON:  CT head 03/15/2019 FINDINGS: CTA NECK FINDINGS Aortic arch: Standard branching. Imaged portion shows no evidence of aneurysm or dissection. No significant stenosis of the major arch vessel origins. Atherosclerotic calcification aortic arch and proximal great vessels. Right carotid system: Mild atherosclerotic calcification right carotid bifurcation without significant stenosis Left carotid system: Mild atherosclerotic calcification left carotid bifurcation without significant stenosis Vertebral arteries: Mild stenosis origin of right vertebral artery. Right vertebral artery is patent to the basilar without additional stenosis Left vertebral artery widely patent the basilar without stenosis. Skeleton: Cervical spondylosis.  No acute skeletal abnormality. Other neck: Small thyroid nodules.  No soft tissue mass in the neck. Upper chest: Lung apices clear bilaterally. Review of the MIP images  confirms the above findings CTA HEAD FINDINGS Anterior circulation: Extensive atherosclerotic calcification in the cavernous carotid bilaterally causing moderate stenosis bilaterally. Anterior and middle cerebral arteries patent bilaterally. Mild stenosis left MCA bifurcation. Posterior circulation: Both vertebral arteries patent to the basilar. PICA patent bilaterally. Basilar widely patent. Superior cerebellar and posterior cerebral arteries patent bilaterally. Moderate stenosis proximal PCA bilaterally. Venous sinuses: Normal venous enhancement Anatomic variants: None Review of the MIP images confirms the above findings CT Brain Perfusion Findings: ASPECTS: 9 CBF (<30%) Volume: 0mL Perfusion (Tmax>6.0s) volume: 0mL Mismatch Volume: 0mL Infarction Location:None IMPRESSION: 1. CT perfusion negative for acute infarct or ischemia 2. CT head demonstrates hypodensity left occipital parietal lobe suggestive of subacute infarct. 3. Atherosclerotic calcification carotid bifurcation bilaterally without significant stenosis. Mild stenosis origin of right vertebral artery. Left vertebral artery widely patent 4. 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 Mild stenosis left MCA bifurcation 5. Moderate stenosis posterior cerebral artery bilaterally Electronically Signed   By: Charles  Clark M.D.   On: 03/15/2019 16:47  ° °Dg Chest 1 View ° °Result Date: 03/15/2019 °CLINICAL DATA:  Right hip pain secondary to a fall this morning. Weakness. Hypertension. EXAM: CHEST  1 VIEW COMPARISON:  Chest x-ray dated 12/12/2009 FINDINGS: Heart size and pulmonary vascularity are normal. Aortic atherosclerosis. Lungs are clear. No effusions. No acute bone abnormality. IMPRESSION: 1. No acute abnormalities. 2. Aortic atherosclerosis. Electronically Signed   By: Wilma Wuthrich  Maxwell M.D.   On: 03/15/2019 12:01  ° °Ct Head Wo Contrast ° °Result Date: 03/15/2019 °CLINICAL DATA:  Status post fall. EXAM: CT HEAD WITHOUT CONTRAST CT  CERVICAL SPINE WITHOUT CONTRAST TECHNIQUE: Multidetector CT imaging of the head and cervical spine was performed following the standard protocol without intravenous contrast. Multiplanar CT image reconstructions of the cervical spine were also generated. COMPARISON:  December 14, 2009. FINDINGS: CT HEAD FINDINGS Brain: Mild chronic ischemic white matter disease is noted. No mass effect or midline shift is noted. Ventricular size is within normal limits. There is no evidence of mass lesion, hemorrhage or acute infarction. Vascular: No hyperdense vessel or unexpected calcification. Skull: Normal. Negative for fracture or focal lesion. Sinuses/Orbits: No   acute finding. Other: None. CT CERVICAL SPINE FINDINGS Alignment: Minimal grade 1 retrolisthesis of C4-5 is noted. Skull base and vertebrae: No acute fracture. No primary bone lesion or focal pathologic process. Soft tissues and spinal canal: No prevertebral fluid or swelling. No visible canal hematoma. Disc levels: Moderate degenerative disc disease is noted at C3-4, C4-5 and C5-6. Upper chest: Negative. Other: Mild degenerative changes are seen involving posterior facet joints bilaterally. IMPRESSION: Mild chronic ischemic white matter disease. No acute intracranial abnormality seen. Moderate multilevel degenerative disc disease. No acute abnormality seen in the cervical spine. Electronically Signed   By: Chidubem Chaires  Green Jr M.D.   On: 03/15/2019 11:45  ° °Ct Angio Neck W Or Wo Contrast ° °Result Date: 03/15/2019 °CLINICAL DATA:  Focal neuro deficit.  Rule out stroke.  Fall. EXAM: CT ANGIOGRAPHY HEAD AND NECK CT PERFUSION BRAIN TECHNIQUE: Multidetector CT imaging of the head and neck was performed using the standard protocol during bolus administration of intravenous contrast. Multiplanar CT image reconstructions and MIPs were obtained to evaluate the vascular anatomy. Carotid stenosis measurements (when applicable) are obtained utilizing NASCET criteria, using the distal  internal carotid diameter as the denominator. Multiphase CT imaging of the brain was performed following IV bolus contrast injection. Subsequent parametric perfusion maps were calculated using RAPID software. CONTRAST:  100mL OMNIPAQUE IOHEXOL 350 MG/ML SOLN COMPARISON:  CT head 03/15/2019 FINDINGS: CTA NECK FINDINGS Aortic arch: Standard branching. Imaged portion shows no evidence of aneurysm or dissection. No significant stenosis of the major arch vessel origins. Atherosclerotic calcification aortic arch and proximal great vessels. Right carotid system: Mild atherosclerotic calcification right carotid bifurcation without significant stenosis Left carotid system: Mild atherosclerotic calcification left carotid bifurcation without significant stenosis Vertebral arteries: Mild stenosis origin of right vertebral artery. Right vertebral artery is patent to the basilar without additional stenosis Left vertebral artery widely patent the basilar without stenosis. Skeleton: Cervical spondylosis.  No acute skeletal abnormality. Other neck: Small thyroid nodules.  No soft tissue mass in the neck. Upper chest: Lung apices clear bilaterally. Review of the MIP images confirms the above findings CTA HEAD FINDINGS Anterior circulation: Extensive atherosclerotic calcification in the cavernous carotid bilaterally causing moderate stenosis bilaterally. Anterior and middle cerebral arteries patent bilaterally. Mild stenosis left MCA bifurcation. Posterior circulation: Both vertebral arteries patent to the basilar. PICA patent bilaterally. Basilar widely patent. Superior cerebellar and posterior cerebral arteries patent bilaterally. Moderate stenosis proximal PCA bilaterally. Venous sinuses: Normal venous enhancement Anatomic variants: None Review of the MIP images confirms the above findings CT Brain Perfusion Findings: ASPECTS: 9 CBF (<30%) Volume: 0mL Perfusion (Tmax>6.0s) volume: 0mL Mismatch Volume: 0mL Infarction Location:None  IMPRESSION: 1. CT perfusion negative for acute infarct or ischemia 2. CT head demonstrates hypodensity left occipital parietal lobe suggestive of subacute infarct. 3. Atherosclerotic calcification carotid bifurcation bilaterally without significant stenosis. Mild stenosis origin of right vertebral artery. Left vertebral artery widely patent 4. 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 Mild stenosis left MCA bifurcation 5. Moderate stenosis posterior cerebral artery bilaterally Electronically Signed   By: Charles  Clark M.D.   On: 03/15/2019 16:47  ° °Ct Cervical Spine Wo Contrast ° °Result Date: 03/15/2019 °CLINICAL DATA:  Status post fall. EXAM: CT HEAD WITHOUT CONTRAST CT CERVICAL SPINE WITHOUT CONTRAST TECHNIQUE: Multidetector CT imaging of the head and cervical spine was performed following the standard protocol without intravenous contrast. Multiplanar CT image reconstructions of the cervical spine were also generated. COMPARISON:  December 14, 2009. FINDINGS: CT HEAD FINDINGS Brain: Mild   chronic ischemic white matter disease is noted. No mass effect or midline shift is noted. Ventricular size is within normal limits. There is no evidence of mass lesion, hemorrhage or acute infarction. Vascular: No hyperdense vessel or unexpected calcification. Skull: Normal. Negative for fracture or focal lesion. Sinuses/Orbits: No acute finding. Other: None. CT CERVICAL SPINE FINDINGS Alignment: Minimal grade 1 retrolisthesis of C4-5 is noted. Skull base and vertebrae: No acute fracture. No primary bone lesion or focal pathologic process. Soft tissues and spinal canal: No prevertebral fluid or swelling. No visible canal hematoma. Disc levels: Moderate degenerative disc disease is noted at C3-4, C4-5 and C5-6. Upper chest: Negative. Other: Mild degenerative changes are seen involving posterior facet joints bilaterally. IMPRESSION: Mild chronic ischemic white matter disease. No acute intracranial abnormality  seen. Moderate multilevel degenerative disc disease. No acute abnormality seen in the cervical spine. Electronically Signed   By: Jamilla Galli  Green Jr M.D.   On: 03/15/2019 11:45  ° °Mr Brain Wo Contrast ° °Result Date: 03/15/2019 °CLINICAL DATA:  Altered level of consciousness.  Stroke. EXAM: MRI HEAD WITHOUT CONTRAST TECHNIQUE: Multiplanar, multiecho pulse sequences of the brain and surrounding structures were obtained without intravenous contrast. COMPARISON:  CT head and CTA head 03/15/2019 FINDINGS: Brain: Acute infarct left parietal lobe. Cortical infarct in the left posterior parietal lobe extending into the high left parietal lobe with numerous small areas of cortical infarct extending into the left frontal lobe. No other acute infarct. Negative for hemorrhage or mass. Chronic microvascular ischemic changes in the white matter. Ventricle size normal. Vascular: Normal arterial flow voids. Skull and upper cervical spine: Negative Sinuses/Orbits: Mild mucosal edema paranasal sinuses. Bilateral cataract surgery Other: None IMPRESSION: Acute infarct left posterior MCA territory without hemorrhage Chronic microvascular ischemic change in the white matter. Electronically Signed   By: Charles  Clark M.D.   On: 03/15/2019 19:32  ° °Ct Hip Right Wo Contrast ° °Result Date: 03/15/2019 °CLINICAL DATA:  Right hip pain.  Twisting injury. EXAM: CT OF THE RIGHT HIP WITHOUT CONTRAST TECHNIQUE: Multidetector CT imaging of the right hip was performed according to the standard protocol. Multiplanar CT image reconstructions were also generated. COMPARISON:  Radiographs, same date. FINDINGS: The right hip is normally located. Mild age related degenerative changes. No acute fracture or evidence of AVN. Mild insertional spurring changes noted along the greater trochanter. The acetabulum is intact. The visualized right hemipelvis is intact. The right SI joint appears normal. No significant muscular abnormality.  No obvious tear or  hematoma. No significant intrapelvic abnormalities are identified. Moderate vascular calcifications. No inguinal mass or hernia. IMPRESSION: 1. Mild right hip joint degenerative changes but no fracture or AVN. 2. The visualized right hemipelvis is intact. Electronically Signed   By: P.  Gallerani M.D.   On: 03/15/2019 12:30  ° °Ct Cerebral Perfusion W Contrast ° °Result Date: 03/15/2019 °CLINICAL DATA:  Focal neuro deficit.  Rule out stroke.  Fall. EXAM: CT ANGIOGRAPHY HEAD AND NECK CT PERFUSION BRAIN TECHNIQUE: Multidetector CT imaging of the head and neck was performed using the standard protocol during bolus administration of intravenous contrast. Multiplanar CT image reconstructions and MIPs were obtained to evaluate the vascular anatomy. Carotid stenosis measurements (when applicable) are obtained utilizing NASCET criteria, using the distal internal carotid diameter as the denominator. Multiphase CT imaging of the brain was performed following IV bolus contrast injection. Subsequent parametric perfusion maps were calculated using RAPID software. CONTRAST:  100mL OMNIPAQUE IOHEXOL 350 MG/ML SOLN COMPARISON:  CT head 03/15/2019 FINDINGS: CTA NECK   FINDINGS Aortic arch: Standard branching. Imaged portion shows no evidence of aneurysm or dissection. No significant stenosis of the major arch vessel origins. Atherosclerotic calcification aortic arch and proximal great vessels. Right carotid system: Mild atherosclerotic calcification right carotid bifurcation without significant stenosis Left carotid system: Mild atherosclerotic calcification left carotid bifurcation without significant stenosis Vertebral arteries: Mild stenosis origin of right vertebral artery. Right vertebral artery is patent to the basilar without additional stenosis Left vertebral artery widely patent the basilar without stenosis. Skeleton: Cervical spondylosis.  No acute skeletal abnormality. Other neck: Small thyroid nodules.  No soft tissue mass  in the neck. Upper chest: Lung apices clear bilaterally. Review of the MIP images confirms the above findings CTA HEAD FINDINGS Anterior circulation: Extensive atherosclerotic calcification in the cavernous carotid bilaterally causing moderate stenosis bilaterally. Anterior and middle cerebral arteries patent bilaterally. Mild stenosis left MCA bifurcation. Posterior circulation: Both vertebral arteries patent to the basilar. PICA patent bilaterally. Basilar widely patent. Superior cerebellar and posterior cerebral arteries patent bilaterally. Moderate stenosis proximal PCA bilaterally. Venous sinuses: Normal venous enhancement Anatomic variants: None Review of the MIP images confirms the above findings CT Brain Perfusion Findings: ASPECTS: 9 CBF (<30%) Volume: 0mL Perfusion (Tmax>6.0s) volume: 0mL Mismatch Volume: 0mL Infarction Location:None IMPRESSION: 1. CT perfusion negative for acute infarct or ischemia 2. CT head demonstrates hypodensity left occipital parietal lobe suggestive of subacute infarct. 3. Atherosclerotic calcification carotid bifurcation bilaterally without significant stenosis. Mild stenosis origin of right vertebral artery. Left vertebral artery widely patent 4. 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 Mild stenosis left MCA bifurcation 5. Moderate stenosis posterior cerebral artery bilaterally Electronically Signed   By: Charles  Clark M.D.   On: 03/15/2019 16:47  ° °Dg Hip Unilat With Pelvis 2-3 Views Right ° °Result Date: 03/15/2019 °CLINICAL DATA:  Right hip pain secondary to a fall this morning. EXAM: DG HIP (WITH OR WITHOUT PELVIS) 2-3V RIGHT COMPARISON:  None. FINDINGS: On the AP view of the pelvis there is suggestion of a hairline fracture of the right femoral neck. This is not visible on the other views in this could represent an overlying soft tissue fold. CT scan of the right hip recommended for further evaluation. IMPRESSION: 1. Possible hairline fracture of  the right femoral neck. CT scan of the right hip recommended for further evaluation. 2. No other significant abnormalities of the right hip are seen. Electronically Signed   By: Spruha Weight  Maxwell M.D.   On: 03/15/2019 12:00  ° °Ct Head Code Stroke Wo Contrast ° °Result Date: 03/15/2019 °CLINICAL DATA:  Code stroke.  Stroke.  Fall. EXAM: CT HEAD WITHOUT CONTRAST TECHNIQUE: Contiguous axial images were obtained from the base of the skull through the vertex without intravenous contrast. COMPARISON:  CT head earlier today, proximally 5 hours previously FINDINGS: Brain: Ill-defined hypodensity left occipital parietal cortex is similar the prior study and most compatible with subacute infarct. Generalized atrophy. Bilateral white matter disease most compatible with chronic microvascular ischemia. Small chronic infarct right thalamus. Bilateral basal ganglia chronic small infarcts. Negative for hemorrhage or mass. Vascular: Negative for hyperdense vessel Skull: Negative Sinuses/Orbits: Mild mucosal edema paranasal sinuses. Bilateral ocular surgery. Other: None ASPECTS ([place] Stroke Program Early CT Score) - Ganglionic level infarction (caudate, lentiform nuclei, internal capsule, insula, M1-M3 cortex): 6 - Supraganglionic infarction (M4-M6 cortex): 3 Total score (0-10 with 10 being normal): 9 IMPRESSION: 1. Hypodensity left occipital parietal cortex is unchanged from earlier today. The appearance is most consistent with subacute infarct. Recommend MRI to   confirm. 2. Moderate to advanced chronic microvascular ischemic change. No acute hemorrhage 3. ASPECTS is 9 4. These results were called by telephone at the time of interpretation on 03/15/2019 at 4:35 pm to provider ASHISH ARORA , who verbally acknowledged these results. Electronically Signed   By: Charles  Clark M.D.   On: 03/15/2019 16:37  ° °Vas Us Lower Extremity Venous (dvt) ° °Result Date: 03/16/2019 ° Lower Venous Study Indications: Stroke, and embolic.  Comparison  Study: No prior. Performing Technologist: Rita Sturdivant RDMS, RVT  Examination Guidelines: A complete evaluation includes B-mode imaging, spectral Doppler, color Doppler, and power Doppler as needed of all accessible portions of each vessel. Bilateral testing is considered an integral part of a complete examination. Limited examinations for reoccurring indications may be performed as noted.  +---------+---------------+---------+-----------+----------+--------------+  RIGHT     Compressibility Phasicity Spontaneity Properties Thrombus Aging  +---------+---------------+---------+-----------+----------+--------------+  CFV       Full            Yes       Yes                                    +---------+---------------+---------+-----------+----------+--------------+  SFJ       Full                                                             +---------+---------------+---------+-----------+----------+--------------+  FV Prox   Full                                                             +---------+---------------+---------+-----------+----------+--------------+  FV Mid    Full                                                             +---------+---------------+---------+-----------+----------+--------------+  FV Distal Full                                                             +---------+---------------+---------+-----------+----------+--------------+  PFV       Full                                                             +---------+---------------+---------+-----------+----------+--------------+  POP       Full            Yes       Yes                                    +---------+---------------+---------+-----------+----------+--------------+    PTV       Full                                                             +---------+---------------+---------+-----------+----------+--------------+  PERO      Full                                                              +---------+---------------+---------+-----------+----------+--------------+   +---------+---------------+---------+-----------+----------+--------------+  LEFT      Compressibility Phasicity Spontaneity Properties Thrombus Aging  +---------+---------------+---------+-----------+----------+--------------+  CFV       Full            Yes       Yes                                    +---------+---------------+---------+-----------+----------+--------------+  SFJ       Full                                                             +---------+---------------+---------+-----------+----------+--------------+  FV Prox   Full                                                             +---------+---------------+---------+-----------+----------+--------------+  FV Mid    Full                                                             +---------+---------------+---------+-----------+----------+--------------+  FV Distal Full                                                             +---------+---------------+---------+-----------+----------+--------------+  PFV       Full                                                             +---------+---------------+---------+-----------+----------+--------------+  POP       Full            Yes       Yes                                    +---------+---------------+---------+-----------+----------+--------------+    PTV       Full                                                             +---------+---------------+---------+-----------+----------+--------------+  PERO      Full                                                             +---------+---------------+---------+-----------+----------+--------------+     Summary: Right: There is no evidence of deep vein thrombosis in the lower extremity. No cystic structure found in the popliteal fossa. Left: There is no evidence of deep vein thrombosis in the lower extremity. No cystic structure found in the popliteal fossa.  *See  table(s) above for measurements and observations. Electronically signed by Todd Early MD on 03/16/2019 at 4:35:43 PM.    Final   ° ° °12-lead ECG NSR at 62 bpm with LBBB of 142 ms (personally reviewed) °There are no EKG's in the system prior to this admission.  °  °Telemetry NSR 60s (personally reviewed) ° °Assessment and Plan: ° °1. Cryptogenic stroke °The patient presents with cryptogenic stroke.  The patient does not have a TEE planned.  I spoke at length with the patient about monitoring for afib with an implantable loop recorder.  Risks, benefits, and alteratives to implantable loop recorder were discussed with the patient today.   At this time, the patient is very clear in their decision to proceed with implantable loop recorder.  ° °Wound care was reviewed with the patient (keep incision clean and dry for 3 days).  Wound check scheduled and entered in AVS. Please call with questions.  ° ° °Michael Andrew Tillery, PA-C °03/17/2019 °8:40 AM ° °I have seen, examined the patient, and reviewed the above assessment and plan.  Changes to above are made where necessary.  On exam, RRR.   I spoke at length with the patient about monitoring for afib with either a 30 day event monitor or an implantable loop recorder.  Risks, benefits, and alteratives to implantable loop recorder were discussed with the patient today.   At this time, the patient is very clear in their decision to proceed with implantable loop recorder. °  °Co Sign: Kennetta Pavlovic, MD °03/17/2019 °11:04 AM ° ° °

## 2019-03-16 NOTE — Progress Notes (Signed)
Venous duplex lower ext  has been completed. Refer to [facility] under chart review to view preliminary results.   03/16/2019  12:04 PM Sturdivant, Bonnye Fava

## 2019-03-16 NOTE — Evaluation (Signed)
Physical Therapy Evaluation Patient Details Name: Rebecca Gallagher MRN: [id number] DOB: 12-13-1945 Today's Date: 03/16/2019   History of Present Illness   73 y.o. female with medical history significant of diabetes, hypertension, depression is a transfer from med [employer] for evaluation of TIA/stroke. 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  Clinical Impression  Pt demonstrates deficits in functional mobility, gait, balance, endurance, strength, power, safety awareness. Pt requires physical assistance for all mobility at this time due to balance and functional mobility deficits. Pt presents with increase in plantar flexor tone in RLE and will benefit from use of PRAFO boot to prevent ankle flexion contracture. Pt will benefit from early aggressive mobilization to aide in a return to independent mobility. PT recommends high intensity inpatient PT services at the time of discharge, pt agreeable.    Follow Up Recommendations CIR    Equipment Recommendations  Rolling walker with 5" wheels;3in1 (PT)    Recommendations for Other Services       Precautions / Restrictions Precautions Precautions: Fall Restrictions Weight Bearing Restrictions: No      Mobility  Bed Mobility Overal bed mobility: Needs Assistance Bed Mobility: Supine to Sit     Supine to sit: Min assist        Transfers Overall transfer level: Needs assistance Equipment used: Rolling walker (2 wheeled) Transfers: Sit to/from [employer] Sit to Stand: Min assist Stand pivot transfers: Mod assist       General transfer comment: minA sit to stand with use of RW for 2 trials. ModA stand step turn transfer from bed to recliner with support of PT  Ambulation/Gait                Stairs            Wheelchair Mobility    Modified Rankin (Stroke Patients Only) Modified Rankin (Stroke Patients Only) Pre-Morbid Rankin Score: No symptoms Modified Rankin: Moderately severe  disability     Balance Overall balance assessment: Needs assistance Sitting-balance support: Bilateral upper extremity supported;Feet supported Sitting balance-Leahy Scale: Poor Sitting balance - Comments: minG-minA to maintain static sitting balance   Standing balance support: Bilateral upper extremity supported Standing balance-Leahy Scale: Poor Standing balance comment: minA to maintain static standing balance with BUE support of RW or of PT                             Pertinent Vitals/Pain Pain Assessment: Faces Faces Pain Scale: Hurts little more Pain Location: R foot Pain Descriptors / Indicators: Aching Pain Intervention(s): Limited activity within patient's tolerance    Home Living Family/patient expects to be discharged to:: Private residence Living Arrangements: Children;Other relatives Available Help at Discharge: Family;Available PRN/intermittently Type of Home: House Home Access: Stairs to enter Entrance Stairs-Rails: None Entrance Stairs-Number of Steps: 2 Home Layout: Two level;Bed/bath upstairs Home Equipment: None      Prior Function Level of Independence: Independent               Hand Dominance   Dominant Hand: Right    Extremity/Trunk Assessment   Upper Extremity Assessment Upper Extremity Assessment: RUE deficits/detail RUE Deficits / Details: grossly 3-/5 RUE RUE Sensation: decreased light touch    Lower Extremity Assessment Lower Extremity Assessment: RLE deficits/detail;LLE deficits/detail RLE Deficits / Details: Grossly 4-/5 RLE, increased tone in R ankle resulting in resting plantar flexion RLE Sensation: decreased light touch LLE Deficits / Details: Grossly 4/5  Cervical / Trunk Assessment Cervical / Trunk Assessment: Normal  Communication   Communication: (slurred speech)  Cognition Arousal/Alertness: Awake/alert Behavior During Therapy: WFL for tasks assessed/performed Overall Cognitive Status: Within  Functional Limits for tasks assessed                                        General Comments      Exercises General Exercises - Lower Extremity Ankle Circles/Pumps: AROM;Both;5 reps Gluteal Sets: AROM;5 reps;Both Straight Leg Raises: AROM;Both;5 reps   Assessment/Plan    PT Assessment Patient needs continued PT services  PT Problem List Decreased strength;Decreased range of motion;Decreased activity tolerance;Decreased balance;Decreased coordination;Decreased knowledge of use of DME;Decreased safety awareness;Decreased knowledge of precautions;Impaired sensation;Pain       PT Treatment Interventions DME instruction;Gait training;Stair training;Functional mobility training;Therapeutic activities;Therapeutic exercise;Balance training;Neuromuscular re-education;Patient/family education    PT Goals (Current goals can be found in the Care Plan section)  Acute Rehab PT Goals Patient Stated Goal: To return to independent mobility PT Goal Formulation: With patient/family Time For Goal Achievement: 03/30/19 Potential to Achieve Goals: Good    Frequency Min 4X/week   Barriers to discharge Inaccessible home environment pt bed and bath on 2nd floor up full flight of stairs    Co-evaluation               AM-PAC PT "6 Clicks" Mobility  Outcome Measure Help needed turning from your back to your side while in a flat bed without using bedrails?: A Little Help needed moving from lying on your back to sitting on the side of a flat bed without using bedrails?: A Little Help needed moving to and from a bed to a chair (including a wheelchair)?: A Lot Help needed standing up from a chair using your arms (e.g., wheelchair or bedside chair)?: A Little Help needed to walk in hospital room?: A Lot Help needed climbing 3-5 steps with a railing? : Total 6 Click Score: 14    End of Session Equipment Utilized During Treatment: Gait belt Activity Tolerance: Patient tolerated  treatment well Patient left: in chair;with call bell/phone within reach;with family/visitor present Nurse Communication: Mobility status PT Visit Diagnosis: Other abnormalities of gait and mobility (R26.89)    Time: 3500-9381 PT Time Calculation (min) (ACUTE ONLY): 38 min   Charges:   PT Evaluation $PT Eval Low Complexity: 1 Low PT Treatments $Therapeutic Activity: 8-22 mins        Zenaida Niece, PT, DPT Acute Rehabilitation Pager: [phone]   Zenaida Niece 03/16/2019, 2:04 PM

## 2019-03-16 NOTE — Procedures (Signed)
Patient Name: Rebecca Gallagher  MRN: [id number]  Epilepsy Attending: Lora Havens  Referring Physician/Provider: Dr Early Osmond Date: 03/16/2019 Duration: 23.45 mins  Patient history: 73yo F with ams.  EEG to evaluate for seizure  Level of alertness: awake, drowsy  AEDs during EEG study: None  Technical aspects: This EEG study was done with scalp electrodes positioned according to the 10-20 International system of electrode placement. Electrical activity was acquired at a sampling rate of 500Hz  and reviewed with a high frequency filter of 70Hz  and a low frequency filter of 1Hz . EEG data were recorded continuously and digitally stored.   Description: During awake state, the posterior dominant rhythm consists of 9-10 Hz activity of moderate voltage (25-35 uV) seen predominantly in posterior head regions, symmetric and reactive to eye opening and eye closing.  Drowsiness was characterized by attenuation of the posterior background rhythm. Frequent sharp waves were seen in left temporal ( maximal T7) were seen. There was also left temporal intermittent 2-3Hz  delta slowing. Hyperventilation and photic stimulation were not performed.  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 No seizures  were seen throughout the recording.

## 2019-03-16 NOTE — Progress Notes (Signed)
STROKE TEAM PROGRESS NOTE   INTERVAL HISTORY Daughter at bedside. Pt sitting in chair, still has nonfluent speech and mild right hemiparesis with hemiparesthesia. Daughter said pt had some calcified heart valves and heart murmur before.   Vitals:   03/16/19 0700 03/16/19 0730 03/16/19 0828 03/16/19 1147  BP: (!) 149/44 (!) 154/47 (!) 178/56 (!) 154/53  Pulse: (!) 56 63 70 71  Resp: Temp:   98.7 F (37.1 C) 98.2 F (36.8 C)  TempSrc:   Oral Oral  SpO2: 97% 97% 97% 98%  Weight:      Height:        CBC:  Recent Labs  Lab 03/15/19 1158 03/15/19 1722  WBC 8.0 7.9  NEUTROABS 5.5  --   HGB 14.1 13.4  HCT 42.4 38.7  MCV 84.1 83.6  PLT 264 255    Basic Metabolic Panel:  Recent Labs  Lab 03/15/19 1158 03/15/19 1704  NA 138  --   K 3.4*  --   CL 105  --   CO2 24  --   GLUCOSE 156*  --   BUN 16  --   CREATININE 0.88 0.94  CALCIUM 9.8  --    Lipid Panel:     Component Value Date/Time   CHOL 216 (H) 03/16/2019 0500   TRIG 106 03/16/2019 0500   HDL 51 03/16/2019 0500   CHOLHDL 4.2 03/16/2019 0500   VLDL 21 03/16/2019 0500   LDLCALC 144 (H) 03/16/2019 0500   HgbA1c:  Lab Results  Component Value Date   HGBA1C 7.5 (H) 03/15/2019   Urine Drug Screen: No results found for: LABOPIA, COCAINSCRNUR, LABBENZ, AMPHETMU, THCU, LABBARB  Alcohol Level No results found for: ETH  IMAGING Ct Angio Head W Or Wo Contrast  Result Date: 03/15/2019 CLINICAL DATA:  Focal neuro deficit.  Rule out stroke.  Fall. EXAM: CT ANGIOGRAPHY HEAD AND NECK CT PERFUSION BRAIN TECHNIQUE: Multidetector CT imaging of the head and neck was performed using the standard protocol during bolus administration of intravenous contrast. Multiplanar CT image reconstructions and MIPs were obtained to evaluate the vascular anatomy. Carotid stenosis measurements (when applicable) are obtained utilizing NASCET criteria, using the distal internal carotid diameter as the denominator. Multiphase CT imaging  of the brain was performed following IV bolus contrast injection. Subsequent parametric perfusion maps were calculated using RAPID software. CONTRAST:  OMNIPAQUE IOHEXOL 350 MG/ML SOLN COMPARISON:  CT head 03/15/2019 FINDINGS: CTA NECK FINDINGS Aortic arch: Standard branching. Imaged portion shows no evidence of aneurysm or dissection. No significant stenosis of the major arch vessel origins. Atherosclerotic calcification aortic arch and proximal great vessels. Right carotid system: Mild atherosclerotic calcification right carotid bifurcation without significant stenosis Left carotid system: Mild atherosclerotic calcification left carotid bifurcation without significant stenosis Vertebral arteries: Mild stenosis origin of right vertebral artery. Right vertebral artery is patent to the basilar without additional stenosis Left vertebral artery widely patent the basilar without stenosis. Skeleton: Cervical spondylosis.  No acute skeletal abnormality. Other neck: Small thyroid nodules.  No soft tissue mass in the neck. Upper chest: Lung apices clear bilaterally. Review of the MIP images confirms the above findings CTA HEAD FINDINGS Anterior circulation: Extensive atherosclerotic calcification in the cavernous carotid bilaterally causing moderate stenosis bilaterally. Anterior and middle cerebral arteries patent bilaterally. Mild stenosis left MCA bifurcation. Posterior circulation: Both vertebral arteries patent to the basilar. PICA patent bilaterally. Basilar widely patent. Superior cerebellar and posterior cerebral arteries patent bilaterally. Moderate stenosis proximal PCA bilaterally.  Venous sinuses: Normal venous enhancement Anatomic variants: None Review of the MIP images confirms the above findings CT Brain Perfusion Findings: ASPECTS: 9 CBF (<30%) Volume: 0mL Perfusion (Tmax>6.0s) volume: 0mL Mismatch Volume: 0mL Infarction Location:None IMPRESSION: 1. CT perfusion negative for acute infarct or ischemia 2.  CT head demonstrates hypodensity left occipital parietal lobe suggestive of subacute infarct. 3. Atherosclerotic calcification carotid bifurcation bilaterally without significant stenosis. Mild stenosis origin of right vertebral artery. Left vertebral artery widely patent 4. 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 Mild stenosis left MCA bifurcation 5. Moderate stenosis posterior cerebral artery bilaterally Electronically Signed   By: Marlan Palauharles  Clark M.D.   On: 03/15/2019 16:47   Dg Chest 1 View  Result Date: 03/15/2019 CLINICAL DATA:  Right hip pain secondary to a fall this morning. Weakness. Hypertension. EXAM: CHEST  1 VIEW COMPARISON:  Chest x-ray dated 12/12/2009 FINDINGS: Heart size and pulmonary vascularity are normal. Aortic atherosclerosis. Lungs are clear. No effusions. No acute bone abnormality. IMPRESSION: 1. No acute abnormalities. 2. Aortic atherosclerosis. Electronically Signed   By: Francene BoyersJames  Maxwell M.D.   On: 03/15/2019 12:01   Ct Head Wo Contrast  Result Date: 03/15/2019 CLINICAL DATA:  Status post fall. EXAM: CT HEAD WITHOUT CONTRAST CT CERVICAL SPINE WITHOUT CONTRAST TECHNIQUE: Multidetector CT imaging of the head and cervical spine was performed following the standard protocol without intravenous contrast. Multiplanar CT image reconstructions of the cervical spine were also generated. COMPARISON:  December 14, 2009. FINDINGS: CT HEAD FINDINGS Brain: Mild chronic ischemic white matter disease is noted. No mass effect or midline shift is noted. Ventricular size is within normal limits. There is no evidence of mass lesion, hemorrhage or acute infarction. Vascular: No hyperdense vessel or unexpected calcification. Skull: Normal. Negative for fracture or focal lesion. Sinuses/Orbits: No acute finding. Other: None. CT CERVICAL SPINE FINDINGS Alignment: Minimal grade 1 retrolisthesis of C4-5 is noted. Skull base and vertebrae: No acute fracture. No primary bone lesion or focal  pathologic process. Soft tissues and spinal canal: No prevertebral fluid or swelling. No visible canal hematoma. Disc levels: Moderate degenerative disc disease is noted at C3-4, C4-5 and C5-6. Upper chest: Negative. Other: Mild degenerative changes are seen involving posterior facet joints bilaterally. IMPRESSION: Mild chronic ischemic white matter disease. No acute intracranial abnormality seen. Moderate multilevel degenerative disc disease. No acute abnormality seen in the cervical spine. Electronically Signed   By: Lupita RaiderJames  Green Jr M.D.   On: 03/15/2019 11:45   Ct Angio Neck W Or Wo Contrast  Result Date: 03/15/2019 CLINICAL DATA:  Focal neuro deficit.  Rule out stroke.  Fall. EXAM: CT ANGIOGRAPHY HEAD AND NECK CT PERFUSION BRAIN TECHNIQUE: Multidetector CT imaging of the head and neck was performed using the standard protocol during bolus administration of intravenous contrast. Multiplanar CT image reconstructions and MIPs were obtained to evaluate the vascular anatomy. Carotid stenosis measurements (when applicable) are obtained utilizing NASCET criteria, using the distal internal carotid diameter as the denominator. Multiphase CT imaging of the brain was performed following IV bolus contrast injection. Subsequent parametric perfusion maps were calculated using RAPID software. CONTRAST:  100mL OMNIPAQUE IOHEXOL 350 MG/ML SOLN COMPARISON:  CT head 03/15/2019 FINDINGS: CTA NECK FINDINGS Aortic arch: Standard branching. Imaged portion shows no evidence of aneurysm or dissection. No significant stenosis of the major arch vessel origins. Atherosclerotic calcification aortic arch and proximal great vessels. Right carotid system: Mild atherosclerotic calcification right carotid bifurcation without significant stenosis Left carotid system: Mild atherosclerotic calcification left carotid bifurcation without  significant stenosis Vertebral arteries: Mild stenosis origin of right vertebral artery. Right vertebral  artery is patent to the basilar without additional stenosis Left vertebral artery widely patent the basilar without stenosis. Skeleton: Cervical spondylosis.  No acute skeletal abnormality. Other neck: Small thyroid nodules.  No soft tissue mass in the neck. Upper chest: Lung apices clear bilaterally. Review of the MIP images confirms the above findings CTA HEAD FINDINGS Anterior circulation: Extensive atherosclerotic calcification in the cavernous carotid bilaterally causing moderate stenosis bilaterally. Anterior and middle cerebral arteries patent bilaterally. Mild stenosis left MCA bifurcation. Posterior circulation: Both vertebral arteries patent to the basilar. PICA patent bilaterally. Basilar widely patent. Superior cerebellar and posterior cerebral arteries patent bilaterally. Moderate stenosis proximal PCA bilaterally. Venous sinuses: Normal venous enhancement Anatomic variants: None Review of the MIP images confirms the above findings CT Brain Perfusion Findings: ASPECTS: 9 CBF (<30%) Volume: 0mL Perfusion (Tmax>6.0s) volume: 0mL Mismatch Volume: 0mL Infarction Location:None IMPRESSION: 1. CT perfusion negative for acute infarct or ischemia 2. CT head demonstrates hypodensity left occipital parietal lobe suggestive of subacute infarct. 3. Atherosclerotic calcification carotid bifurcation bilaterally without significant stenosis. Mild stenosis origin of right vertebral artery. Left vertebral artery widely patent 4. 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 Mild stenosis left MCA bifurcation 5. Moderate stenosis posterior cerebral artery bilaterally Electronically Signed   By: Marlan Palau M.D.   On: 03/15/2019 16:47   Ct Cervical Spine Wo Contrast  Result Date: 03/15/2019 CLINICAL DATA:  Status post fall. EXAM: CT HEAD WITHOUT CONTRAST CT CERVICAL SPINE WITHOUT CONTRAST TECHNIQUE: Multidetector CT imaging of the head and cervical spine was performed following the standard  protocol without intravenous contrast. Multiplanar CT image reconstructions of the cervical spine were also generated. COMPARISON:  December 14, 2009. FINDINGS: CT HEAD FINDINGS Brain: Mild chronic ischemic white matter disease is noted. No mass effect or midline shift is noted. Ventricular size is within normal limits. There is no evidence of mass lesion, hemorrhage or acute infarction. Vascular: No hyperdense vessel or unexpected calcification. Skull: Normal. Negative for fracture or focal lesion. Sinuses/Orbits: No acute finding. Other: None. CT CERVICAL SPINE FINDINGS Alignment: Minimal grade 1 retrolisthesis of C4-5 is noted. Skull base and vertebrae: No acute fracture. No primary bone lesion or focal pathologic process. Soft tissues and spinal canal: No prevertebral fluid or swelling. No visible canal hematoma. Disc levels: Moderate degenerative disc disease is noted at C3-4, C4-5 and C5-6. Upper chest: Negative. Other: Mild degenerative changes are seen involving posterior facet joints bilaterally. IMPRESSION: Mild chronic ischemic white matter disease. No acute intracranial abnormality seen. Moderate multilevel degenerative disc disease. No acute abnormality seen in the cervical spine. Electronically Signed   By: Lupita Raider M.D.   On: 03/15/2019 11:45   Mr Brain Wo Contrast  Result Date: 03/15/2019 CLINICAL DATA:  Altered level of consciousness.  Stroke. EXAM: MRI HEAD WITHOUT CONTRAST TECHNIQUE: Multiplanar, multiecho pulse sequences of the brain and surrounding structures were obtained without intravenous contrast. COMPARISON:  CT head and CTA head 03/15/2019 FINDINGS: Brain: Acute infarct left parietal lobe. Cortical infarct in the left posterior parietal lobe extending into the high left parietal lobe with numerous small areas of cortical infarct extending into the left frontal lobe. No other acute infarct. Negative for hemorrhage or mass. Chronic microvascular ischemic changes in the white matter.  Ventricle size normal. Vascular: Normal arterial flow voids. Skull and upper cervical spine: Negative Sinuses/Orbits: Mild mucosal edema paranasal sinuses. 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 Electronically Signed   By: Marlan Palau M.D.   On: 03/15/2019 19:32   Ct Hip Right Wo Contrast  Result Date: 03/15/2019 CLINICAL DATA:  Right hip pain.  Twisting injury. EXAM: CT OF THE RIGHT HIP WITHOUT CONTRAST TECHNIQUE: Multidetector CT imaging of the right hip was performed according to the standard protocol. Multiplanar CT image reconstructions were also generated. COMPARISON:  Radiographs, same date. FINDINGS: The right hip is normally located. Mild age related degenerative changes. No acute fracture or evidence of AVN. Mild insertional spurring changes noted along the greater trochanter. The acetabulum is intact. The visualized right hemipelvis is intact. The right SI joint appears normal. No significant muscular abnormality.  No obvious tear or hematoma. No significant intrapelvic abnormalities are identified. Moderate vascular calcifications. No inguinal mass or hernia. IMPRESSION: 1. Mild right hip joint degenerative changes but no fracture or AVN. 2. The visualized right hemipelvis is intact. Electronically Signed   By: Rudie Meyer M.D.   On: 03/15/2019 12:30   Ct Cerebral Perfusion W Contrast  Result Date: 03/15/2019 CLINICAL DATA:  Focal neuro deficit.  Rule out stroke.  Fall. EXAM: CT ANGIOGRAPHY HEAD AND NECK CT PERFUSION BRAIN TECHNIQUE: Multidetector CT imaging of the head and neck was performed using the standard protocol during bolus administration of intravenous contrast. Multiplanar CT image reconstructions and MIPs were obtained to evaluate the vascular anatomy. Carotid stenosis measurements (when applicable) are obtained utilizing NASCET criteria, using the  distal internal carotid diameter as the denominator. Multiphase CT imaging of the brain was performed following IV bolus contrast injection. Subsequent parametric perfusion maps were calculated using RAPID software. CONTRAST:  OMNIPAQUE IOHEXOL 350 MG/ML SOLN COMPARISON:  CT head 03/15/2019 FINDINGS: CTA NECK FINDINGS Aortic arch: Standard branching. Imaged portion shows no evidence of aneurysm or dissection. No significant stenosis of the major arch vessel origins. Atherosclerotic calcification aortic arch and proximal great vessels. Right carotid system: Mild atherosclerotic calcification right carotid bifurcation without significant stenosis Left carotid system: Mild atherosclerotic calcification left carotid bifurcation without significant stenosis Vertebral arteries: Mild stenosis origin of right vertebral artery. Right vertebral artery is patent to the basilar without additional stenosis Left vertebral artery widely patent the basilar without stenosis. Skeleton: Cervical spondylosis.  No acute skeletal abnormality. Other neck: Small thyroid nodules.  No soft tissue mass in the neck. Upper chest: Lung apices clear bilaterally. Review of the MIP images confirms the above findings CTA HEAD FINDINGS Anterior circulation: Extensive atherosclerotic calcification in the cavernous carotid bilaterally causing moderate stenosis bilaterally. Anterior and middle cerebral arteries patent bilaterally. Mild stenosis left MCA bifurcation. Posterior circulation: Both vertebral arteries patent to the basilar. PICA patent bilaterally. Basilar widely patent. Superior cerebellar and posterior cerebral arteries patent bilaterally. Moderate stenosis proximal PCA bilaterally. Venous sinuses: Normal venous enhancement Anatomic variants: None Review of the MIP images confirms the above findings CT Brain Perfusion Findings: ASPECTS: 9 CBF (<30%) Volume: 58mL Perfusion (Tmax>6.0s) volume: 62mL Mismatch Volume: 13mL Infarction  Location:None IMPRESSION: 1. CT perfusion negative for acute infarct or ischemia 2. CT head demonstrates hypodensity left occipital parietal lobe suggestive of subacute infarct. 3. Atherosclerotic calcification carotid bifurcation bilaterally without significant stenosis. Mild stenosis origin of right vertebral artery. Left vertebral artery widely patent 4. 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 Mild stenosis left MCA bifurcation 5. Moderate stenosis posterior cerebral artery bilaterally Electronically Signed   By: Marlan Palau M.D.   On:  03/15/2019 16:47   Dg Hip Unilat With Pelvis 2-3 Views Right  Result Date: 03/15/2019 CLINICAL DATA:  Right hip pain secondary to a fall this morning. EXAM: DG HIP (WITH OR WITHOUT PELVIS) 2-3V RIGHT COMPARISON:  None. FINDINGS: On the AP view of the pelvis there is suggestion of a hairline fracture of the right femoral neck. This is not visible on the other views in this could represent an overlying soft tissue fold. CT scan of the right hip recommended for further evaluation. IMPRESSION: 1. Possible hairline fracture of the right femoral neck. CT scan of the right hip recommended for further evaluation. 2. No other significant abnormalities of the right hip are seen. Electronically Signed   By: Francene Boyers M.D.   On: 03/15/2019 12:00   Ct Head Code Stroke Wo Contrast  Result Date: 03/15/2019 CLINICAL DATA:  Code stroke.  Stroke.  Fall. EXAM: CT HEAD WITHOUT CONTRAST TECHNIQUE: Contiguous axial images were obtained from the base of the skull through the vertex without intravenous contrast. COMPARISON:  CT head earlier today, proximally 5 hours previously FINDINGS: Brain: Ill-defined hypodensity left occipital parietal cortex is similar the prior study and most compatible with subacute infarct. Generalized atrophy. Bilateral white matter disease most compatible with chronic microvascular ischemia. Small chronic infarct right thalamus.  Bilateral basal ganglia chronic small infarcts. Negative for hemorrhage or mass. Vascular: Negative for hyperdense vessel Skull: Negative Sinuses/Orbits: Mild mucosal edema paranasal sinuses. Bilateral ocular surgery. Other: None ASPECTS ([place] Stroke Program Early CT Score) - Ganglionic level infarction (caudate, lentiform nuclei, internal capsule, insula, M1-M3 cortex): 6 - Supraganglionic infarction (M4-M6 cortex): 3 Total score (0-10 with 10 being normal): 9 IMPRESSION: 1. Hypodensity left occipital parietal cortex is unchanged from earlier today. The appearance is most consistent with subacute infarct. Recommend MRI to confirm. 2. Moderate to advanced chronic microvascular ischemic change. No acute hemorrhage 3. ASPECTS is 9 4. These results were called by telephone at the time of interpretation on 03/15/2019 at 4:35 pm to provider [facility] , who verbally acknowledged these results. Electronically Signed   By: Marlan Palau M.D.   On: 03/15/2019 16:37   Vas [place] Lower Extremity Venous (dvt)  Result Date: 03/16/2019  Lower Venous Study Indications: Stroke, and embolic.  Comparison Study: No prior. Performing Technologist: Marilynne Halsted RDMS, RVT  Examination Guidelines: A complete evaluation includes B-mode imaging, spectral Doppler, color Doppler, and power Doppler as needed of all accessible portions of each vessel. Bilateral testing is considered an integral part of a complete examination. Limited examinations for reoccurring indications may be performed as noted.  +---------+---------------+---------+-----------+----------+--------------+ RIGHT    CompressibilityPhasicitySpontaneityPropertiesThrombus Aging +---------+---------------+---------+-----------+----------+--------------+ CFV      Full           Yes      Yes                                 +---------+---------------+---------+-----------+----------+--------------+ SFJ      Full                                                         +---------+---------------+---------+-----------+----------+--------------+ FV Prox  Full                                                        +---------+---------------+---------+-----------+----------+--------------+  FV Mid   Full                                                        +---------+---------------+---------+-----------+----------+--------------+ FV DistalFull                                                        +---------+---------------+---------+-----------+----------+--------------+ PFV      Full                                                        +---------+---------------+---------+-----------+----------+--------------+ POP      Full           Yes      Yes                                 +---------+---------------+---------+-----------+----------+--------------+ PTV      Full                                                        +---------+---------------+---------+-----------+----------+--------------+ PERO     Full                                                        +---------+---------------+---------+-----------+----------+--------------+   +---------+---------------+---------+-----------+----------+--------------+ LEFT     CompressibilityPhasicitySpontaneityPropertiesThrombus Aging +---------+---------------+---------+-----------+----------+--------------+ CFV      Full           Yes      Yes                                 +---------+---------------+---------+-----------+----------+--------------+ SFJ      Full                                                        +---------+---------------+---------+-----------+----------+--------------+ FV Prox  Full                                                        +---------+---------------+---------+-----------+----------+--------------+ FV Mid   Full                                                         +---------+---------------+---------+-----------+----------+--------------+  FV DistalFull                                                        +---------+---------------+---------+-----------+----------+--------------+ PFV      Full                                                        +---------+---------------+---------+-----------+----------+--------------+ POP      Full           Yes      Yes                                 +---------+---------------+---------+-----------+----------+--------------+ PTV      Full                                                        +---------+---------------+---------+-----------+----------+--------------+ PERO     Full                                                        +---------+---------------+---------+-----------+----------+--------------+     Summary: Right: There is no evidence of deep vein thrombosis in the lower extremity. No cystic structure found in the popliteal fossa. Left: There is no evidence of deep vein thrombosis in the lower extremity. No cystic structure found in the popliteal fossa.  *See table(s) above for measurements and observations.    Preliminary     PHYSICAL EXAM  Temp:  [98.2 F (36.8 C)-98.7 F (37.1 C)] 98.2 F (36.8 C) (10/07 1147) Pulse Rate:  [52-81] 71 (10/07 1147) Resp:  [15-24] 18 (10/07 1147) BP: (135-197)/(43-71) 154/53 (10/07 1147) SpO2:  [94 %-99 %] 98 % (10/07 1147)  General - Well nourished, well developed, mildly lethargic.  Ophthalmologic - fundi not visualized due to noncooperation.  Cardiovascular - Regular rhythm and rate.  Mental Status -  Level of arousal and orientation to time, place, and person were intact. Language exam showed non-fluent speech, paraphasic errors, and hesitation of words, but foloowing commands and able to name and repeat although slow with significant delays.   Cranial Nerves II - XII - II - Visual field intact OU. III, IV, VI - Extraocular  movements intact. V - Facial sensation decreased on the right. VII - mild right facial droop. VIII - Hearing & vestibular intact bilaterally. X - Palate elevates symmetrically. XI - Chin turning & shoulder shrug intact bilaterally. XII - Tongue protrusion intact.  Motor Strength - The patient's strength was 4/5 in LUE and LLE, however, LUE 3+/5 proximal with pronator drift and 3/5 distally. LLE 3/5 proximal and 4-/5 distally.  Bulk was normal and fasciculations were absent.   Motor Tone - Muscle tone was assessed at the neck and appendages and was normal.  Reflexes - The patient's reflexes  were symmetrical in all extremities and she had no pathological reflexes.  Sensory - Light touch, temperature/pinprick were assessed and were decreased on the right.    Coordination - The patient had normal movements in the hands with no ataxia or dysmetria, but significantly slow on action.  Tremor was absent.  Gait and Station - deferred.   ASSESSMENT/PLAN Ms. Rebecca Gallagher is a 73 y.o. female with history of HTN, HLD, DM, depression presenting to [facility] following a presumed fall who developed mixed aphasia and SBP 200s while awaiting admission.  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   CT CS moderate multilevel degenerative dz.  Code Stroke CT head hypodensity L occipital parietal cortex. Moderate to advanced small vessel disease.  ASPECTS 9    CTA head & neck hypodensity L occipital parietal lobe. B ICA fiurcation  Atherosclerosis, B cavernous moderate stenosis. L MCA bifurcation mild stenosis. R VA origin w/ mild stenosis.   CT perfusion negative   MRI  L posterior MCA infarct. Small vessel disease.   LE Doppler  No DVT  2D Echo pending  Recommend loop recorder to rule out afib. EP will be contacted   EEG left temporal sharps   LDL 144  HgbA1c 7.5  Lovenox 40 mg sq  daily for VTE prophylaxis  No antithrombotic prior to admission, now on aspirin 325 mg daily. Recommend ASA 81 and plavix 75 for 3 weeks and then ASA alone.   Therapy recommendations:  pending   Disposition:  pending   Abnormal EEG  EEG showed left temporal sharps  Likely due to left MCA infarct  Put on keppra  bid  Seizure precautions  Hypertensive Urgency  BP as high as 228/94  Stable 150-170s . Permissive hypertension (OK if < 220/120) but gradually normalize in 5-7 days . Long-term BP goal normotensive  Hyperlipidemia  Home meds:  crestor 20  Now on lipiitor 40  LDL 144, goal < 70  Continue statin at discharge  Diabetes type II Uncontrolled  HgbA1c 7.5, goal < 7.0  CBGs  SSI  Close PCP follow up foe better BP control  Other Stroke Risk Factors  Advanced age  ETOH use, advised to drink no more than 1 drink(s) a day  Other Active Problems  Hypokalemia K 3.4  Depression on zoloft   Hospital day # 1  Marvel Plan, MD PhD Stroke Neurology 03/16/2019 1:56 PM    To contact Stroke Continuity provider, please refer to [email or web address]. After hours, contact General Neurology

## 2019-03-16 NOTE — Progress Notes (Signed)
EEG complete - results pending 

## 2019-03-16 NOTE — Consult Note (Addendum)
ELECTROPHYSIOLOGY CONSULT NOTE  Patient ID: Rebecca Gallagher MRN: [id number], DOB/AGE: 12/05/1945   Admit date: 03/15/2019 Date of Consult: 03/17/2019  Primary Physician: No primary care provider on file. Primary Cardiologist: No primary care provider on file.  Primary Electrophysiologist: New to Dr. Rayann Heman Reason for Consultation: Cryptogenic stroke; recommendations regarding Implantable Loop Recorder  History of Present Illness EP has been asked to evaluate Rebecca Gallagher for placement of an implantable loop recorder to monitor for atrial fibrillation by Dr Erlinda Hong.  The patient was admitted on 03/15/2019 with right hemiparesis, hemiparesthesia, and mixed aphasia  They first developed symptoms while at home.  Imaging demonstrated hypodensity in L occipital parietal cortex.  They have undergone workup for stroke including echocardiogram and CTA Head and Neck which showed bilateral cavernous moderate stenosis.  The patient has been monitored on telemetry which has demonstrated sinus rhythm with no arrhythmias.  Inpatient stroke work-up will not require a TEE per Neurology.   Echocardiogram this admission demonstrated EF 60-65%, no apparent thrombus, and LA of 2.4 cm.  Lab work is reviewed.  Prior to admission, the patient denies chest pain, shortness of breath, dizziness, palpitations, or syncope.  They are recovering from their stroke with plans to attend CIR  at discharge.  Past Medical History:  Diagnosis Date   Depression    Diabetes mellitus without complication ([place])    Hypertension      Surgical History:  Past Surgical History:  Procedure Laterality Date   ABDOMINAL HYSTERECTOMY     CATARACT EXTRACTION     EYE SURGERY     TOE AMPUTATION       Medications Prior to Admission  Medication Sig Dispense Refill Last Dose   Carboxymethylcellulose Sodium (EYE DROPS OP) Place 2 drops into both eyes daily.   03/14/2019 at Unknown time   carvedilol (COREG) 6.25 MG tablet Take 6.25 mg by  mouth 2 (two) times daily.    03/15/2019 at 0915   cetirizine (ZYRTEC) 10 MG tablet Take 10 mg by mouth daily. For angioedema   03/15/2019 at Unknown time   INVOKANA 100 MG TABS tablet Take 150 mg by mouth daily. Before the first meal of the day   03/15/2019 at Unknown time   olmesartan (BENICAR) 40 MG tablet Take 40 mg by mouth daily.   03/15/2019 at Unknown time   omeprazole (PRILOSEC) 20 MG capsule Take 20 mg by mouth daily. 30 minutes before morning meal   03/15/2019 at Unknown time   rosuvastatin (CRESTOR) 20 MG tablet Take 20 mg by mouth daily.   03/15/2019 at Unknown time   sertraline (ZOLOFT) 50 MG tablet Take 50 mg by mouth at bedtime.    03/14/2019 at Unknown time    Inpatient Medications:    stroke: mapping our early stages of recovery book   Does not apply Once   aspirin EC  81 mg Oral Daily   atorvastatin  40 mg Oral q1800   clopidogrel  75 mg Oral Daily   enoxaparin (LOVENOX) injection  40 mg Subcutaneous Q24H   insulin aspart  0-15 Units Subcutaneous TID WC   insulin aspart  0-5 Units Subcutaneous QHS   levETIRAcetam  500 mg Oral BID   sertraline  50 mg Oral QHS    Allergies:  Allergies  Allergen Reactions   Hydrocodone-Acetaminophen Palpitations   Pregabalin Palpitations   Valsartan Palpitations   Yellow Dye Itching    FTD:DUKGUR Dye+nisoldipine   Niacin Swelling   Interferons     Beta-1b  Nisoldipine    Losartan Palpitations    Social History   Socioeconomic History   Marital status: Single    Spouse name: Not on file   Number of children: Not on file   Years of education: Not on file   Highest education level: Not on file  Occupational History   Not on file  Social Needs   Financial resource strain: Not on file   Food insecurity    Worry: Not on file    Inability: Not on file   Transportation needs    Medical: Not on file    Non-medical: Not on file  Tobacco Use   Smoking status: Former Smoker   Smokeless tobacco:  Never Used  Substance and Sexual Activity   Alcohol use: Yes    Comment: occ   Drug use: Never   Sexual activity: Not on file  Lifestyle   Physical activity    Days per week: Not on file    Minutes per session: Not on file   Stress: Not on file  Relationships   Social connections    Talks on phone: Not on file    Gets together: Not on file    Attends religious service: Not on file    Active member of club or organization: Not on file    Attends meetings of clubs or organizations: Not on file    Relationship status: Not on file   Intimate partner violence    Fear of current or ex partner: Not on file    Emotionally abused: Not on file    Physically abused: Not on file    Forced sexual activity: Not on file  Other Topics Concern   Not on file  Social History Narrative   Not on file     No family history on file.    Review of Systems: All other systems reviewed and are otherwise negative except as noted above.  Physical Exam: Vitals:   03/16/19 2000 03/16/19 2259 03/17/19 0350 03/17/19 0751  BP: (!) 118/39 (!) 133/44 (!) 135/52 (!) 175/67  Pulse: (!) 58 62 (!) 56 62  Resp: 15 16 15 17   Temp: 98.5 F (36.9 C) 98.3 F (36.8 C) 98.6 F (37 C) 98.4 F (36.9 C)  TempSrc: Oral Oral Oral Oral  SpO2: 97% 98% 95% 98%  Weight:      Height:        GEN- The patient is well appearing, alert and oriented x 3 today.   Head- normocephalic, atraumatic Eyes-  Sclera clear, conjunctiva pink Ears- hearing intact Oropharynx- clear Neck- supple Lungs- Clear to ausculation bilaterally, normal work of breathing Heart- Regular rate and rhythm, no murmurs, rubs or gallops  GI- soft, NT, ND, + BS Extremities- no clubbing, cyanosis, or edema MS- no significant deformity or atrophy Skin- no rash or lesion Psych- euthymic mood, full affect   Labs:   Lab Results  Component Value Date   WBC 7.9 03/15/2019   HGB 13.4 03/15/2019   HCT 38.7 03/15/2019   MCV 83.6  03/15/2019   PLT 255 03/15/2019    Recent Labs  Lab 03/15/19 1158 03/15/19 1704  NA 138  --   K 3.4*  --   CL 105  --   CO2 24  --   BUN 16  --   CREATININE 0.88 0.94  CALCIUM 9.8  --   GLUCOSE 156*  --      Radiology/Studies: Ct Angio Head W Or Wo Contrast  Result  Date: 03/15/2019 CLINICAL DATA:  Focal neuro deficit.  Rule out stroke.  Fall. EXAM: CT ANGIOGRAPHY HEAD AND NECK CT PERFUSION BRAIN TECHNIQUE: Multidetector CT imaging of the head and neck was performed using the standard protocol during bolus administration of intravenous contrast. Multiplanar CT image reconstructions and MIPs were obtained to evaluate the vascular anatomy. Carotid stenosis measurements (when applicable) are obtained utilizing NASCET criteria, using the distal internal carotid diameter as the denominator. Multiphase CT imaging of the brain was performed following IV bolus contrast injection. Subsequent parametric perfusion maps were calculated using RAPID software. CONTRAST:  OMNIPAQUE IOHEXOL 350 MG/ML SOLN COMPARISON:  CT head 03/15/2019 FINDINGS: CTA NECK FINDINGS Aortic arch: Standard branching. Imaged portion shows no evidence of aneurysm or dissection. No significant stenosis of the major arch vessel origins. Atherosclerotic calcification aortic arch and proximal great vessels. Right carotid system: Mild atherosclerotic calcification right carotid bifurcation without significant stenosis Left carotid system: Mild atherosclerotic calcification left carotid bifurcation without significant stenosis Vertebral arteries: Mild stenosis origin of right vertebral artery. Right vertebral artery is patent to the basilar without additional stenosis Left vertebral artery widely patent the basilar without stenosis. Skeleton: Cervical spondylosis.  No acute skeletal abnormality. Other neck: Small thyroid nodules.  No soft tissue mass in the neck. Upper chest: Lung apices clear bilaterally. Review of the MIP images  confirms the above findings CTA HEAD FINDINGS Anterior circulation: Extensive atherosclerotic calcification in the cavernous carotid bilaterally causing moderate stenosis bilaterally. Anterior and middle cerebral arteries patent bilaterally. Mild stenosis left MCA bifurcation. Posterior circulation: Both vertebral arteries patent to the basilar. PICA patent bilaterally. Basilar widely patent. Superior cerebellar and posterior cerebral arteries patent bilaterally. Moderate stenosis proximal PCA bilaterally. Venous sinuses: Normal venous enhancement Anatomic variants: None Review of the MIP images confirms the above findings CT Brain Perfusion Findings: ASPECTS: 9 CBF (<30%) Volume: 0mL Perfusion (Tmax>6.0s) volume: 0mL Mismatch Volume: 0mL Infarction Location:None IMPRESSION: 1. CT perfusion negative for acute infarct or ischemia 2. CT head demonstrates hypodensity left occipital parietal lobe suggestive of subacute infarct. 3. Atherosclerotic calcification carotid bifurcation bilaterally without significant stenosis. Mild stenosis origin of right vertebral artery. Left vertebral artery widely patent 4. 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 Mild stenosis left MCA bifurcation 5. Moderate stenosis posterior cerebral artery bilaterally Electronically Signed   By: Marlan Palau M.D.   On: 03/15/2019 16:47   Dg Chest 1 View  Result Date: 03/15/2019 CLINICAL DATA:  Right hip pain secondary to a fall this morning. Weakness. Hypertension. EXAM: CHEST  1 VIEW COMPARISON:  Chest x-ray dated 12/12/2009 FINDINGS: Heart size and pulmonary vascularity are normal. Aortic atherosclerosis. Lungs are clear. No effusions. No acute bone abnormality. IMPRESSION: 1. No acute abnormalities. 2. Aortic atherosclerosis. Electronically Signed   By: Francene Boyers M.D.   On: 03/15/2019 12:01   Ct Head Wo Contrast  Result Date: 03/15/2019 CLINICAL DATA:  Status post fall. EXAM: CT HEAD WITHOUT CONTRAST CT  CERVICAL SPINE WITHOUT CONTRAST TECHNIQUE: Multidetector CT imaging of the head and cervical spine was performed following the standard protocol without intravenous contrast. Multiplanar CT image reconstructions of the cervical spine were also generated. COMPARISON:  December 14, 2009. FINDINGS: CT HEAD FINDINGS Brain: Mild chronic ischemic white matter disease is noted. No mass effect or midline shift is noted. Ventricular size is within normal limits. There is no evidence of mass lesion, hemorrhage or acute infarction. Vascular: No hyperdense vessel or unexpected calcification. Skull: Normal. Negative for fracture or focal lesion. Sinuses/Orbits: No  acute finding. Other: None. CT CERVICAL SPINE FINDINGS Alignment: Minimal grade 1 retrolisthesis of C4-5 is noted. Skull base and vertebrae: No acute fracture. No primary bone lesion or focal pathologic process. Soft tissues and spinal canal: No prevertebral fluid or swelling. No visible canal hematoma. Disc levels: Moderate degenerative disc disease is noted at C3-4, C4-5 and C5-6. Upper chest: Negative. Other: Mild degenerative changes are seen involving posterior facet joints bilaterally. IMPRESSION: Mild chronic ischemic white matter disease. No acute intracranial abnormality seen. Moderate multilevel degenerative disc disease. No acute abnormality seen in the cervical spine. Electronically Signed   By: Lupita RaiderJames  Green Jr M.D.   On: 03/15/2019 11:45   Ct Angio Neck W Or Wo Contrast  Result Date: 03/15/2019 CLINICAL DATA:  Focal neuro deficit.  Rule out stroke.  Fall. EXAM: CT ANGIOGRAPHY HEAD AND NECK CT PERFUSION BRAIN TECHNIQUE: Multidetector CT imaging of the head and neck was performed using the standard protocol during bolus administration of intravenous contrast. Multiplanar CT image reconstructions and MIPs were obtained to evaluate the vascular anatomy. Carotid stenosis measurements (when applicable) are obtained utilizing NASCET criteria, using the distal  internal carotid diameter as the denominator. Multiphase CT imaging of the brain was performed following IV bolus contrast injection. Subsequent parametric perfusion maps were calculated using RAPID software. CONTRAST:  100mL OMNIPAQUE IOHEXOL 350 MG/ML SOLN COMPARISON:  CT head 03/15/2019 FINDINGS: CTA NECK FINDINGS Aortic arch: Standard branching. Imaged portion shows no evidence of aneurysm or dissection. No significant stenosis of the major arch vessel origins. Atherosclerotic calcification aortic arch and proximal great vessels. Right carotid system: Mild atherosclerotic calcification right carotid bifurcation without significant stenosis Left carotid system: Mild atherosclerotic calcification left carotid bifurcation without significant stenosis Vertebral arteries: Mild stenosis origin of right vertebral artery. Right vertebral artery is patent to the basilar without additional stenosis Left vertebral artery widely patent the basilar without stenosis. Skeleton: Cervical spondylosis.  No acute skeletal abnormality. Other neck: Small thyroid nodules.  No soft tissue mass in the neck. Upper chest: Lung apices clear bilaterally. Review of the MIP images confirms the above findings CTA HEAD FINDINGS Anterior circulation: Extensive atherosclerotic calcification in the cavernous carotid bilaterally causing moderate stenosis bilaterally. Anterior and middle cerebral arteries patent bilaterally. Mild stenosis left MCA bifurcation. Posterior circulation: Both vertebral arteries patent to the basilar. PICA patent bilaterally. Basilar widely patent. Superior cerebellar and posterior cerebral arteries patent bilaterally. Moderate stenosis proximal PCA bilaterally. Venous sinuses: Normal venous enhancement Anatomic variants: None Review of the MIP images confirms the above findings CT Brain Perfusion Findings: ASPECTS: 9 CBF (<30%) Volume: 0mL Perfusion (Tmax>6.0s) volume: 0mL Mismatch Volume: 0mL Infarction Location:None  IMPRESSION: 1. CT perfusion negative for acute infarct or ischemia 2. CT head demonstrates hypodensity left occipital parietal lobe suggestive of subacute infarct. 3. Atherosclerotic calcification carotid bifurcation bilaterally without significant stenosis. Mild stenosis origin of right vertebral artery. Left vertebral artery widely patent 4. 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 Mild stenosis left MCA bifurcation 5. Moderate stenosis posterior cerebral artery bilaterally Electronically Signed   By: Marlan Palauharles  Clark M.D.   On: 03/15/2019 16:47   Ct Cervical Spine Wo Contrast  Result Date: 03/15/2019 CLINICAL DATA:  Status post fall. EXAM: CT HEAD WITHOUT CONTRAST CT CERVICAL SPINE WITHOUT CONTRAST TECHNIQUE: Multidetector CT imaging of the head and cervical spine was performed following the standard protocol without intravenous contrast. Multiplanar CT image reconstructions of the cervical spine were also generated. COMPARISON:  December 14, 2009. FINDINGS: CT HEAD FINDINGS Brain: Mild  chronic ischemic white matter disease is noted. No mass effect or midline shift is noted. Ventricular size is within normal limits. There is no evidence of mass lesion, hemorrhage or acute infarction. Vascular: No hyperdense vessel or unexpected calcification. Skull: Normal. Negative for fracture or focal lesion. Sinuses/Orbits: No acute finding. Other: None. CT CERVICAL SPINE FINDINGS Alignment: Minimal grade 1 retrolisthesis of C4-5 is noted. Skull base and vertebrae: No acute fracture. No primary bone lesion or focal pathologic process. Soft tissues and spinal canal: No prevertebral fluid or swelling. No visible canal hematoma. Disc levels: Moderate degenerative disc disease is noted at C3-4, C4-5 and C5-6. Upper chest: Negative. Other: Mild degenerative changes are seen involving posterior facet joints bilaterally. IMPRESSION: Mild chronic ischemic white matter disease. No acute intracranial abnormality  seen. Moderate multilevel degenerative disc disease. No acute abnormality seen in the cervical spine. Electronically Signed   By: Lupita Raider M.D.   On: 03/15/2019 11:45   Mr Brain Wo Contrast  Result Date: 03/15/2019 CLINICAL DATA:  Altered level of consciousness.  Stroke. EXAM: MRI HEAD WITHOUT CONTRAST TECHNIQUE: Multiplanar, multiecho pulse sequences of the brain and surrounding structures were obtained without intravenous contrast. COMPARISON:  CT head and CTA head 03/15/2019 FINDINGS: Brain: Acute infarct left parietal lobe. Cortical infarct in the left posterior parietal lobe extending into the high left parietal lobe with numerous small areas of cortical infarct extending into the left frontal lobe. No other acute infarct. Negative for hemorrhage or mass. Chronic microvascular ischemic changes in the white matter. Ventricle size normal. Vascular: Normal arterial flow voids. Skull and upper cervical spine: Negative Sinuses/Orbits: Mild mucosal edema paranasal sinuses. Bilateral cataract surgery Other: None IMPRESSION: Acute infarct left posterior MCA territory without hemorrhage Chronic microvascular ischemic change in the white matter. Electronically Signed   By: Marlan Palau M.D.   On: 03/15/2019 19:32   Ct Hip Right Wo Contrast  Result Date: 03/15/2019 CLINICAL DATA:  Right hip pain.  Twisting injury. EXAM: CT OF THE RIGHT HIP WITHOUT CONTRAST TECHNIQUE: Multidetector CT imaging of the right hip was performed according to the standard protocol. Multiplanar CT image reconstructions were also generated. COMPARISON:  Radiographs, same date. FINDINGS: The right hip is normally located. Mild age related degenerative changes. No acute fracture or evidence of AVN. Mild insertional spurring changes noted along the greater trochanter. The acetabulum is intact. The visualized right hemipelvis is intact. The right SI joint appears normal. No significant muscular abnormality.  No obvious tear or  hematoma. No significant intrapelvic abnormalities are identified. Moderate vascular calcifications. No inguinal mass or hernia. IMPRESSION: 1. Mild right hip joint degenerative changes but no fracture or AVN. 2. The visualized right hemipelvis is intact. Electronically Signed   By: Rudie Meyer M.D.   On: 03/15/2019 12:30   Ct Cerebral Perfusion W Contrast  Result Date: 03/15/2019 CLINICAL DATA:  Focal neuro deficit.  Rule out stroke.  Fall. EXAM: CT ANGIOGRAPHY HEAD AND NECK CT PERFUSION BRAIN TECHNIQUE: Multidetector CT imaging of the head and neck was performed using the standard protocol during bolus administration of intravenous contrast. Multiplanar CT image reconstructions and MIPs were obtained to evaluate the vascular anatomy. Carotid stenosis measurements (when applicable) are obtained utilizing NASCET criteria, using the distal internal carotid diameter as the denominator. Multiphase CT imaging of the brain was performed following IV bolus contrast injection. Subsequent parametric perfusion maps were calculated using RAPID software. CONTRAST:  OMNIPAQUE IOHEXOL 350 MG/ML SOLN COMPARISON:  CT head 03/15/2019 FINDINGS: CTA NECK  FINDINGS Aortic arch: Standard branching. Imaged portion shows no evidence of aneurysm or dissection. No significant stenosis of the major arch vessel origins. Atherosclerotic calcification aortic arch and proximal great vessels. Right carotid system: Mild atherosclerotic calcification right carotid bifurcation without significant stenosis Left carotid system: Mild atherosclerotic calcification left carotid bifurcation without significant stenosis Vertebral arteries: Mild stenosis origin of right vertebral artery. Right vertebral artery is patent to the basilar without additional stenosis Left vertebral artery widely patent the basilar without stenosis. Skeleton: Cervical spondylosis.  No acute skeletal abnormality. Other neck: Small thyroid nodules.  No soft tissue mass  in the neck. Upper chest: Lung apices clear bilaterally. Review of the MIP images confirms the above findings CTA HEAD FINDINGS Anterior circulation: Extensive atherosclerotic calcification in the cavernous carotid bilaterally causing moderate stenosis bilaterally. Anterior and middle cerebral arteries patent bilaterally. Mild stenosis left MCA bifurcation. Posterior circulation: Both vertebral arteries patent to the basilar. PICA patent bilaterally. Basilar widely patent. Superior cerebellar and posterior cerebral arteries patent bilaterally. Moderate stenosis proximal PCA bilaterally. Venous sinuses: Normal venous enhancement Anatomic variants: None Review of the MIP images confirms the above findings CT Brain Perfusion Findings: ASPECTS: 9 CBF (<30%) Volume: 0mL Perfusion (Tmax>6.0s) volume: 0mL Mismatch Volume: 0mL Infarction Location:None IMPRESSION: 1. CT perfusion negative for acute infarct or ischemia 2. CT head demonstrates hypodensity left occipital parietal lobe suggestive of subacute infarct. 3. Atherosclerotic calcification carotid bifurcation bilaterally without significant stenosis. Mild stenosis origin of right vertebral artery. Left vertebral artery widely patent 4. 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 Mild stenosis left MCA bifurcation 5. Moderate stenosis posterior cerebral artery bilaterally Electronically Signed   By: Marlan Palau M.D.   On: 03/15/2019 16:47   Dg Hip Unilat With Pelvis 2-3 Views Right  Result Date: 03/15/2019 CLINICAL DATA:  Right hip pain secondary to a fall this morning. EXAM: DG HIP (WITH OR WITHOUT PELVIS) 2-3V RIGHT COMPARISON:  None. FINDINGS: On the AP view of the pelvis there is suggestion of a hairline fracture of the right femoral neck. This is not visible on the other views in this could represent an overlying soft tissue fold. CT scan of the right hip recommended for further evaluation. IMPRESSION: 1. Possible hairline fracture of  the right femoral neck. CT scan of the right hip recommended for further evaluation. 2. No other significant abnormalities of the right hip are seen. Electronically Signed   By: Francene Boyers M.D.   On: 03/15/2019 12:00   Ct Head Code Stroke Wo Contrast  Result Date: 03/15/2019 CLINICAL DATA:  Code stroke.  Stroke.  Fall. EXAM: CT HEAD WITHOUT CONTRAST TECHNIQUE: Contiguous axial images were obtained from the base of the skull through the vertex without intravenous contrast. COMPARISON:  CT head earlier today, proximally 5 hours previously FINDINGS: Brain: Ill-defined hypodensity left occipital parietal cortex is similar the prior study and most compatible with subacute infarct. Generalized atrophy. Bilateral white matter disease most compatible with chronic microvascular ischemia. Small chronic infarct right thalamus. Bilateral basal ganglia chronic small infarcts. Negative for hemorrhage or mass. Vascular: Negative for hyperdense vessel Skull: Negative Sinuses/Orbits: Mild mucosal edema paranasal sinuses. Bilateral ocular surgery. Other: None ASPECTS ([place] Stroke Program Early CT Score) - Ganglionic level infarction (caudate, lentiform nuclei, internal capsule, insula, M1-M3 cortex): 6 - Supraganglionic infarction (M4-M6 cortex): 3 Total score (0-10 with 10 being normal): 9 IMPRESSION: 1. Hypodensity left occipital parietal cortex is unchanged from earlier today. The appearance is most consistent with subacute infarct. Recommend MRI to  confirm. 2. Moderate to advanced chronic microvascular ischemic change. No acute hemorrhage 3. ASPECTS is 9 4. These results were called by telephone at the time of interpretation on 03/15/2019 at 4:35 pm to provider [facility] , who verbally acknowledged these results. Electronically Signed   By: Marlan Palau M.D.   On: 03/15/2019 16:37   Vas [place] Lower Extremity Venous (dvt)  Result Date: 03/16/2019  Lower Venous Study Indications: Stroke, and embolic.  Comparison  Study: No prior. Performing Technologist: Marilynne Halsted RDMS, RVT  Examination Guidelines: A complete evaluation includes B-mode imaging, spectral Doppler, color Doppler, and power Doppler as needed of all accessible portions of each vessel. Bilateral testing is considered an integral part of a complete examination. Limited examinations for reoccurring indications may be performed as noted.  +---------+---------------+---------+-----------+----------+--------------+  RIGHT     Compressibility Phasicity Spontaneity Properties Thrombus Aging  +---------+---------------+---------+-----------+----------+--------------+  CFV       Full            Yes       Yes                                    +---------+---------------+---------+-----------+----------+--------------+  SFJ       Full                                                             +---------+---------------+---------+-----------+----------+--------------+  FV Prox   Full                                                             +---------+---------------+---------+-----------+----------+--------------+  FV Mid    Full                                                             +---------+---------------+---------+-----------+----------+--------------+  FV Distal Full                                                             +---------+---------------+---------+-----------+----------+--------------+  PFV       Full                                                             +---------+---------------+---------+-----------+----------+--------------+  POP       Full            Yes       Yes                                    +---------+---------------+---------+-----------+----------+--------------+  PTV       Full                                                             +---------+---------------+---------+-----------+----------+--------------+  PERO      Full                                                              +---------+---------------+---------+-----------+----------+--------------+   +---------+---------------+---------+-----------+----------+--------------+  LEFT      Compressibility Phasicity Spontaneity Properties Thrombus Aging  +---------+---------------+---------+-----------+----------+--------------+  CFV       Full            Yes       Yes                                    +---------+---------------+---------+-----------+----------+--------------+  SFJ       Full                                                             +---------+---------------+---------+-----------+----------+--------------+  FV Prox   Full                                                             +---------+---------------+---------+-----------+----------+--------------+  FV Mid    Full                                                             +---------+---------------+---------+-----------+----------+--------------+  FV Distal Full                                                             +---------+---------------+---------+-----------+----------+--------------+  PFV       Full                                                             +---------+---------------+---------+-----------+----------+--------------+  POP       Full            Yes       Yes                                    +---------+---------------+---------+-----------+----------+--------------+  PTV       Full                                                             +---------+---------------+---------+-----------+----------+--------------+  PERO      Full                                                             +---------+---------------+---------+-----------+----------+--------------+     Summary: Right: There is no evidence of deep vein thrombosis in the lower extremity. No cystic structure found in the popliteal fossa. Left: There is no evidence of deep vein thrombosis in the lower extremity. No cystic structure found in the popliteal fossa.  *See  table(s) above for measurements and observations. Electronically signed by Gretta Began MD on 03/16/2019 at 4:35:43 PM.    Final     12-lead ECG NSR at 62 bpm with LBBB of 142 ms (personally reviewed) There are no EKG's in the system prior to this admission.    Telemetry NSR 60s (personally reviewed)  Assessment and Plan:  1. Cryptogenic stroke The patient presents with cryptogenic stroke.  The patient does not have a TEE planned.  I spoke at length with the patient about monitoring for afib with an implantable loop recorder.  Risks, benefits, and alteratives to implantable loop recorder were discussed with the patient today.   At this time, the patient is very clear in their decision to proceed with implantable loop recorder.   Wound care was reviewed with the patient (keep incision clean and dry for 3 days).  Wound check scheduled and entered in AVS. Please call with questions.    Graciella Freer, PA-C 03/17/2019 8:40 AM  I have seen, examined the patient, and reviewed the above assessment and plan.  Changes to above are made where necessary.  On exam, RRR.   I spoke at length with the patient about monitoring for afib with either a 30 day event monitor or an implantable loop recorder.  Risks, benefits, and alteratives to implantable loop recorder were discussed with the patient today.   At this time, the patient is very clear in their decision to proceed with implantable loop recorder.   Co Sign: Hillis Range, MD 03/17/2019 11:04 AM

## 2019-03-16 NOTE — Progress Notes (Signed)
SLP Cancellation Note  Patient Details Name: Empress Newmann MRN: [id number] DOB: 03/17/1946   Cancelled treatment:       Reason Eval/Treat Not Completed: Patient at procedure or test/unavailable   Happi Overton 03/16/2019, 2:24 PM

## 2019-03-16 NOTE — ED Notes (Signed)
Patients daughter, Judeen Hammans, would like to be called with updates [phone]

## 2019-03-17 ENCOUNTER — Encounter (HOSPITAL_COMMUNITY): Payer: Self-pay | Admitting: Internal Medicine

## 2019-03-17 ENCOUNTER — Encounter (HOSPITAL_COMMUNITY)
Admission: EM | Disposition: A | Discharge status: Skilled Nursing Facility | Payer: Self-pay | Source: Home / Self Care | Attending: Family Medicine

## 2019-03-17 DIAGNOSIS — R9401 Abnormal electroencephalogram [EEG]: Secondary | ICD-10-CM

## 2019-03-17 DIAGNOSIS — I6389 Other cerebral infarction: Secondary | ICD-10-CM

## 2019-03-17 DIAGNOSIS — G40909 Epilepsy, unspecified, not intractable, without status epilepticus: Secondary | ICD-10-CM

## 2019-03-17 HISTORY — PX: LOOP RECORDER INSERTION: EP1214

## 2019-03-17 LAB — GLUCOSE, CAPILLARY
Glucose-Capillary: 104 mg/dL — ABNORMAL HIGH (ref 70–99)
Glucose-Capillary: 137 mg/dL — ABNORMAL HIGH (ref 70–99)
Glucose-Capillary: 193 mg/dL — ABNORMAL HIGH (ref 70–99)
Glucose-Capillary: 92 mg/dL (ref 70–99)

## 2019-03-17 SURGERY — LOOP RECORDER INSERTION

## 2019-03-17 MED ORDER — LIDOCAINE-EPINEPHRINE 1 %-1:100000 IJ SOLN
INTRAMUSCULAR | Status: DC | PRN
  Administered 2019-03-17: 20 mL

## 2019-03-17 MED ORDER — IRBESARTAN 300 MG PO TABS
300.0000 mg | ORAL_TABLET | Freq: Every day | ORAL | Status: DC
  Administered 2019-03-17 – 2019-03-24 (×8): 300 mg via ORAL
  Filled 2019-03-17 (×8): qty 1

## 2019-03-17 MED ORDER — LIDOCAINE-EPINEPHRINE 1 %-1:100000 IJ SOLN
INTRAMUSCULAR | Status: AC
  Filled 2019-03-17: qty 1

## 2019-03-17 SURGICAL SUPPLY — 2 items
MONITOR REVEAL LINQ II (Prosthesis & Implant Heart) ×1 IMPLANT
PACK LOOP INSERTION (CUSTOM PROCEDURE TRAY) ×2 IMPLANT

## 2019-03-17 NOTE — Progress Notes (Signed)
STROKE TEAM PROGRESS NOTE   INTERVAL HISTORY Patient daughter at bedside.  Patient had loop recorder placed today.  Neuro stable.  PT/OT recommend SNF.  Vitals:   03/16/19 2259 03/17/19 0350 03/17/19 0751 03/17/19 1158  BP: (!) 133/44 (!) 135/52 (!) 175/67 (!) 206/61  Pulse: 62 (!) 56 62 69  Resp: Temp: 98.3 F (36.8 C) 98.6 F (37 C) 98.4 F (36.9 C) 99.1 F (37.3 C)  TempSrc: Oral Oral Oral Oral  SpO2: 98% 95% 98% 94%  Weight:      Height:        CBC:  Recent Labs  Lab 03/15/19 1158 03/15/19 1722  WBC 8.0 7.9  NEUTROABS 5.5  --   HGB 14.1 13.4  HCT 42.4 38.7  MCV 84.1 83.6  PLT 264 255    Basic Metabolic Panel:  Recent Labs  Lab 03/15/19 1158 03/15/19 1704  NA 138  --   K 3.4*  --   CL 105  --   CO2 24  --   GLUCOSE 156*  --   BUN 16  --   CREATININE 0.88 0.94  CALCIUM 9.8  --    Lipid Panel:     Component Value Date/Time   CHOL 216 (H) 03/16/2019 0500   TRIG 106 03/16/2019 0500   HDL 51 03/16/2019 0500   CHOLHDL 4.2 03/16/2019 0500   VLDL 21 03/16/2019 0500   LDLCALC 144 (H) 03/16/2019 0500   HgbA1c:  Lab Results  Component Value Date   HGBA1C 7.5 (H) 03/15/2019   Urine Drug Screen: No results found for: LABOPIA, COCAINSCRNUR, LABBENZ, AMPHETMU, THCU, LABBARB  Alcohol Level No results found for: ETH  IMAGING Ct Angio Head W Or Wo Contrast  Result Date: 03/15/2019 CLINICAL DATA:  Focal neuro deficit.  Rule out stroke.  Fall. EXAM: CT ANGIOGRAPHY HEAD AND NECK CT PERFUSION BRAIN TECHNIQUE: Multidetector CT imaging of the head and neck was performed using the standard protocol during bolus administration of intravenous contrast. Multiplanar CT image reconstructions and MIPs were obtained to evaluate the vascular anatomy. Carotid stenosis measurements (when applicable) are obtained utilizing NASCET criteria, using the distal internal carotid diameter as the denominator. Multiphase CT imaging of the brain was performed following IV bolus  contrast injection. Subsequent parametric perfusion maps were calculated using RAPID software. CONTRAST:  OMNIPAQUE IOHEXOL 350 MG/ML SOLN COMPARISON:  CT head 03/15/2019 FINDINGS: CTA NECK FINDINGS Aortic arch: Standard branching. Imaged portion shows no evidence of aneurysm or dissection. No significant stenosis of the major arch vessel origins. Atherosclerotic calcification aortic arch and proximal great vessels. Right carotid system: Mild atherosclerotic calcification right carotid bifurcation without significant stenosis Left carotid system: Mild atherosclerotic calcification left carotid bifurcation without significant stenosis Vertebral arteries: Mild stenosis origin of right vertebral artery. Right vertebral artery is patent to the basilar without additional stenosis Left vertebral artery widely patent the basilar without stenosis. Skeleton: Cervical spondylosis.  No acute skeletal abnormality. Other neck: Small thyroid nodules.  No soft tissue mass in the neck. Upper chest: Lung apices clear bilaterally. Review of the MIP images confirms the above findings CTA HEAD FINDINGS Anterior circulation: Extensive atherosclerotic calcification in the cavernous carotid bilaterally causing moderate stenosis bilaterally. Anterior and middle cerebral arteries patent bilaterally. Mild stenosis left MCA bifurcation. Posterior circulation: Both vertebral arteries patent to the basilar. PICA patent bilaterally. Basilar widely patent. Superior cerebellar and posterior cerebral arteries patent bilaterally. Moderate stenosis proximal PCA bilaterally. Venous sinuses: Normal venous enhancement Anatomic  variants: None Review of the MIP images confirms the above findings CT Brain Perfusion Findings: ASPECTS: 9 CBF (<30%) Volume: 90mL Perfusion (Tmax>6.0s) volume: 7mL Mismatch Volume: 56mL Infarction Location:None IMPRESSION: 1. CT perfusion negative for acute infarct or ischemia 2. CT head demonstrates hypodensity left  occipital parietal lobe suggestive of subacute infarct. 3. Atherosclerotic calcification carotid bifurcation bilaterally without significant stenosis. Mild stenosis origin of right vertebral artery. Left vertebral artery widely patent 4. 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 Mild stenosis left MCA bifurcation 5. Moderate stenosis posterior cerebral artery bilaterally Electronically Signed   By: Franchot Gallo M.D.   On: 03/15/2019 16:47   Ct Angio Neck W Or Wo Contrast  Result Date: 03/15/2019 CLINICAL DATA:  Focal neuro deficit.  Rule out stroke.  Fall. EXAM: CT ANGIOGRAPHY HEAD AND NECK CT PERFUSION BRAIN TECHNIQUE: Multidetector CT imaging of the head and neck was performed using the standard protocol during bolus administration of intravenous contrast. Multiplanar CT image reconstructions and MIPs were obtained to evaluate the vascular anatomy. Carotid stenosis measurements (when applicable) are obtained utilizing NASCET criteria, using the distal internal carotid diameter as the denominator. Multiphase CT imaging of the brain was performed following IV bolus contrast injection. Subsequent parametric perfusion maps were calculated using RAPID software. CONTRAST:  191mL OMNIPAQUE IOHEXOL 350 MG/ML SOLN COMPARISON:  CT head 03/15/2019 FINDINGS: CTA NECK FINDINGS Aortic arch: Standard branching. Imaged portion shows no evidence of aneurysm or dissection. No significant stenosis of the major arch vessel origins. Atherosclerotic calcification aortic arch and proximal great vessels. Right carotid system: Mild atherosclerotic calcification right carotid bifurcation without significant stenosis Left carotid system: Mild atherosclerotic calcification left carotid bifurcation without significant stenosis Vertebral arteries: Mild stenosis origin of right vertebral artery. Right vertebral artery is patent to the basilar without additional stenosis Left vertebral artery widely patent the  basilar without stenosis. Skeleton: Cervical spondylosis.  No acute skeletal abnormality. Other neck: Small thyroid nodules.  No soft tissue mass in the neck. Upper chest: Lung apices clear bilaterally. Review of the MIP images confirms the above findings CTA HEAD FINDINGS Anterior circulation: Extensive atherosclerotic calcification in the cavernous carotid bilaterally causing moderate stenosis bilaterally. Anterior and middle cerebral arteries patent bilaterally. Mild stenosis left MCA bifurcation. Posterior circulation: Both vertebral arteries patent to the basilar. PICA patent bilaterally. Basilar widely patent. Superior cerebellar and posterior cerebral arteries patent bilaterally. Moderate stenosis proximal PCA bilaterally. Venous sinuses: Normal venous enhancement Anatomic variants: None Review of the MIP images confirms the above findings CT Brain Perfusion Findings: ASPECTS: 9 CBF (<30%) Volume: 59mL Perfusion (Tmax>6.0s) volume: 14mL Mismatch Volume: 50mL Infarction Location:None IMPRESSION: 1. CT perfusion negative for acute infarct or ischemia 2. CT head demonstrates hypodensity left occipital parietal lobe suggestive of subacute infarct. 3. Atherosclerotic calcification carotid bifurcation bilaterally without significant stenosis. Mild stenosis origin of right vertebral artery. Left vertebral artery widely patent 4. 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 Mild stenosis left MCA bifurcation 5. Moderate stenosis posterior cerebral artery bilaterally Electronically Signed   By: Franchot Gallo M.D.   On: 03/15/2019 16:47   Mr Brain Wo Contrast  Result Date: 03/15/2019 CLINICAL DATA:  Altered level of consciousness.  Stroke. EXAM: MRI HEAD WITHOUT CONTRAST TECHNIQUE: Multiplanar, multiecho pulse sequences of the brain and surrounding structures were obtained without intravenous contrast. COMPARISON:  CT head and CTA head 03/15/2019 FINDINGS: Brain: Acute infarct left parietal lobe.  Cortical infarct in the left posterior parietal lobe extending into the high left parietal lobe with  numerous small areas of cortical infarct extending into the left frontal lobe. No other acute infarct. Negative for hemorrhage or mass. Chronic microvascular ischemic changes in the white matter. Ventricle size normal. Vascular: Normal arterial flow voids. Skull and upper cervical spine: Negative Sinuses/Orbits: Mild mucosal edema paranasal sinuses. Bilateral cataract surgery Other: None IMPRESSION: Acute infarct left posterior MCA territory without hemorrhage Chronic microvascular ischemic change in the white matter. Electronically Signed   By: Marlan Palau M.D.   On: 03/15/2019 19:32   Ct Cerebral Perfusion W Contrast  Result Date: 03/15/2019 CLINICAL DATA:  Focal neuro deficit.  Rule out stroke.  Fall. EXAM: CT ANGIOGRAPHY HEAD AND NECK CT PERFUSION BRAIN TECHNIQUE: Multidetector CT imaging of the head and neck was performed using the standard protocol during bolus administration of intravenous contrast. Multiplanar CT image reconstructions and MIPs were obtained to evaluate the vascular anatomy. Carotid stenosis measurements (when applicable) are obtained utilizing NASCET criteria, using the distal internal carotid diameter as the denominator. Multiphase CT imaging of the brain was performed following IV bolus contrast injection. Subsequent parametric perfusion maps were calculated using RAPID software. CONTRAST:  OMNIPAQUE IOHEXOL 350 MG/ML SOLN COMPARISON:  CT head 03/15/2019 FINDINGS: CTA NECK FINDINGS Aortic arch: Standard branching. Imaged portion shows no evidence of aneurysm or dissection. No significant stenosis of the major arch vessel origins. Atherosclerotic calcification aortic arch and proximal great vessels. Right carotid system: Mild atherosclerotic calcification right carotid bifurcation without significant stenosis Left carotid system: Mild atherosclerotic calcification left carotid  bifurcation without significant stenosis Vertebral arteries: Mild stenosis origin of right vertebral artery. Right vertebral artery is patent to the basilar without additional stenosis Left vertebral artery widely patent the basilar without stenosis. Skeleton: Cervical spondylosis.  No acute skeletal abnormality. Other neck: Small thyroid nodules.  No soft tissue mass in the neck. Upper chest: Lung apices clear bilaterally. Review of the MIP images confirms the above findings CTA HEAD FINDINGS Anterior circulation: Extensive atherosclerotic calcification in the cavernous carotid bilaterally causing moderate stenosis bilaterally. Anterior and middle cerebral arteries patent bilaterally. Mild stenosis left MCA bifurcation. Posterior circulation: Both vertebral arteries patent to the basilar. PICA patent bilaterally. Basilar widely patent. Superior cerebellar and posterior cerebral arteries patent bilaterally. Moderate stenosis proximal PCA bilaterally. Venous sinuses: Normal venous enhancement Anatomic variants: None Review of the MIP images confirms the above findings CT Brain Perfusion Findings: ASPECTS: 9 CBF (<30%) Volume: 0mL Perfusion (Tmax>6.0s) volume: 0mL Mismatch Volume: 0mL Infarction Location:None IMPRESSION: 1. CT perfusion negative for acute infarct or ischemia 2. CT head demonstrates hypodensity left occipital parietal lobe suggestive of subacute infarct. 3. Atherosclerotic calcification carotid bifurcation bilaterally without significant stenosis. Mild stenosis origin of right vertebral artery. Left vertebral artery widely patent 4. 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 Mild stenosis left MCA bifurcation 5. Moderate stenosis posterior cerebral artery bilaterally Electronically Signed   By: Marlan Palau M.D.   On: 03/15/2019 16:47   Ct Head Code Stroke Wo Contrast  Result Date: 03/15/2019 CLINICAL DATA:  Code stroke.  Stroke.  Fall. EXAM: CT HEAD WITHOUT CONTRAST  TECHNIQUE: Contiguous axial images were obtained from the base of the skull through the vertex without intravenous contrast. COMPARISON:  CT head earlier today, proximally 5 hours previously FINDINGS: Brain: Ill-defined hypodensity left occipital parietal cortex is similar the prior study and most compatible with subacute infarct. Generalized atrophy. Bilateral white matter disease most compatible with chronic microvascular ischemia. Small chronic infarct right thalamus. Bilateral basal ganglia chronic small infarcts. Negative for  hemorrhage or mass. Vascular: Negative for hyperdense vessel Skull: Negative Sinuses/Orbits: Mild mucosal edema paranasal sinuses. Bilateral ocular surgery. Other: None ASPECTS ([place] Stroke Program Early CT Score) - Ganglionic level infarction (caudate, lentiform nuclei, internal capsule, insula, M1-M3 cortex): 6 - Supraganglionic infarction (M4-M6 cortex): 3 Total score (0-10 with 10 being normal): 9 IMPRESSION: 1. Hypodensity left occipital parietal cortex is unchanged from earlier today. The appearance is most consistent with subacute infarct. Recommend MRI to confirm. 2. Moderate to advanced chronic microvascular ischemic change. No acute hemorrhage 3. ASPECTS is 9 4. These results were called by telephone at the time of interpretation on 03/15/2019 at 4:35 pm to provider [facility] , who verbally acknowledged these results. Electronically Signed   By: Marlan Palau M.D.   On: 03/15/2019 16:37   Vas [place] Lower Extremity Venous (dvt)  Result Date: 03/16/2019  Lower Venous Study Indications: Stroke, and embolic.  Comparison Study: No prior. Performing Technologist: Marilynne Halsted RDMS, RVT  Examination Guidelines: A complete evaluation includes B-mode imaging, spectral Doppler, color Doppler, and power Doppler as needed of all accessible portions of each vessel. Bilateral testing is considered an integral part of a complete examination. Limited examinations for reoccurring  indications may be performed as noted.  +---------+---------------+---------+-----------+----------+--------------+ RIGHT    CompressibilityPhasicitySpontaneityPropertiesThrombus Aging +---------+---------------+---------+-----------+----------+--------------+ CFV      Full           Yes      Yes                                 +---------+---------------+---------+-----------+----------+--------------+ SFJ      Full                                                        +---------+---------------+---------+-----------+----------+--------------+ FV Prox  Full                                                        +---------+---------------+---------+-----------+----------+--------------+ FV Mid   Full                                                        +---------+---------------+---------+-----------+----------+--------------+ FV DistalFull                                                        +---------+---------------+---------+-----------+----------+--------------+ PFV      Full                                                        +---------+---------------+---------+-----------+----------+--------------+ POP      Full  Yes      Yes                                 +---------+---------------+---------+-----------+----------+--------------+ PTV      Full                                                        +---------+---------------+---------+-----------+----------+--------------+ PERO     Full                                                        +---------+---------------+---------+-----------+----------+--------------+   +---------+---------------+---------+-----------+----------+--------------+ LEFT     CompressibilityPhasicitySpontaneityPropertiesThrombus Aging +---------+---------------+---------+-----------+----------+--------------+ CFV      Full           Yes      Yes                                  +---------+---------------+---------+-----------+----------+--------------+ SFJ      Full                                                        +---------+---------------+---------+-----------+----------+--------------+ FV Prox  Full                                                        +---------+---------------+---------+-----------+----------+--------------+ FV Mid   Full                                                        +---------+---------------+---------+-----------+----------+--------------+ FV DistalFull                                                        +---------+---------------+---------+-----------+----------+--------------+ PFV      Full                                                        +---------+---------------+---------+-----------+----------+--------------+ POP      Full           Yes      Yes                                 +---------+---------------+---------+-----------+----------+--------------+ PTV  Full                                                        +---------+---------------+---------+-----------+----------+--------------+ PERO     Full                                                        +---------+---------------+---------+-----------+----------+--------------+     Summary: Right: There is no evidence of deep vein thrombosis in the lower extremity. No cystic structure found in the popliteal fossa. Left: There is no evidence of deep vein thrombosis in the lower extremity. No cystic structure found in the popliteal fossa.  *See table(s) above for measurements and observations. Electronically signed by Gretta Beganodd Early MD on 03/16/2019 at 4:35:43 PM.    Final     PHYSICAL EXAM    Temp:  [98.3 F (36.8 C)-99.1 F (37.3 C)] 99.1 F (37.3 C) (10/08 1158) Pulse Rate:  [56-69] 69 (10/08 1158) Resp:  [15-18] 18 (10/08 1158) BP: (118-206)/(39-67) 206/61 (10/08 1158) SpO2:  [94 %-98 %] 94 % (10/08  1158)  General - Well nourished, well developed, not in acute distress  Ophthalmologic - fundi not visualized due to noncooperation.  Cardiovascular - Regular rhythm and rate.  Mental Status -  Level of arousal and orientation to time, place, and person were intact. Language exam showed non-fluent speech, paraphasic errors, and hesitation of words, but following commands and able to name and repeat although slow with significant delays.   Cranial Nerves II - XII - II - Visual field intact OU. III, IV, VI - Extraocular movements intact. V - Facial sensation decreased on the right. VII - mild right facial droop, improving. VIII - Hearing & vestibular intact bilaterally. X - Palate elevates symmetrically. XI - Chin turning & shoulder shrug intact bilaterally. XII - Tongue protrusion intact.  Motor Strength - The patient's strength was 4/5 in LUE and LLE, however, RUE 3+/5 proximal with pronator drift and 3/5 distally. RLE 3/5 proximal and 4-/5 distally.  Bulk was normal and fasciculations were absent.   Motor Tone - Muscle tone was assessed at the neck and appendages and was normal.  Reflexes - The patient's reflexes were symmetrical in all extremities and she had no pathological reflexes.  Sensory - Light touch, temperature/pinprick were assessed and were decreased on the right.    Coordination - The patient had normal movements in the hands with no ataxia or dysmetria, but significantly slow on action.  Tremor was absent.  Gait and Station - deferred.   ASSESSMENT/PLAN Ms. Rebecca Gallagher is a 73 y.o. female with history of HTN, HLD, DM, depression [facility] a presumed fall who developed mixed aphasia and SBP 200s while awaiting admission.  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   CT CS moderate multilevel degenerative dz.  Code Stroke CT head  hypodensity L occipital parietal cortex. Moderate to advanced small vessel disease.  ASPECTS 9    CTA head & neck hypodensity L occipital parietal lobe. B ICA fiurcation  Atherosclerosis, B cavernous moderate  stenosis. L MCA bifurcation mild stenosis. R VA origin w/ mild stenosis.   CT perfusion negative   MRI  L posterior MCA infarct. Small vessel disease.   LE Doppler  No DVT  2D Echo EF 60-65%. No source of embolus   Loop recorder inserted 10/8 to rule out afib  EEG left temporal sharps   LDL 144  HgbA1c 7.5  Lovenox 40 mg sq daily for VTE prophylaxis  No antithrombotic prior to admission, now on aspirin 325 mg daily. Recommend ASA 81 and plavix 75 for 3 weeks and then ASA alone.   Therapy recommendations:  CIR-> SNF d/t family wishes, home arrangements  Disposition:  SNF  NOTHING FURTHER TO ADD FROM THE STROKE STANDPOINT Follow-up Stroke Clinic at [facility] in 4 weeks. Office will call with appointment date and time. Order placed.  Abnormal EEG  EEG showed left temporal sharps  Likely due to left MCA infarct  Put on keppra  bid, continue on discharge  Seizure precautions  Hypertensive Urgency  BP as high as 228/94  Stable  . Permissive hypertension (OK if < 220/120) but gradually normalize in 5-7 days . Long-term BP goal normotensive  Hyperlipidemia  Home meds:  crestor 20  Now on lipiitor 40  LDL 144, goal < 70  Continue statin at discharge  Diabetes type II Uncontrolled  HgbA1c 7.5, goal < 7.0  CBGs  SSI  Close PCP follow up foe better BP control  Other Stroke Risk Factors  Advanced age  ETOH use, advised to drink no more than 1 drink(s) a day  Other Active Problems  Hypokalemia K 3.4  Depression on zoloft   Dehydration on IVF  Hospital day # 2  Neurology will sign off. Please call with questions. Pt will follow up with stroke clinic NP at [facility] in about 4 weeks. Thanks for the consult.  Marvel Plan, MD  PhD Stroke Neurology 03/17/2019 1:23 PM    To contact Stroke Continuity provider, please refer to [email or web address]. After hours, contact General Neurology

## 2019-03-17 NOTE — Progress Notes (Signed)
Pt went for loop recorder procedure today 03/17/2019. During therapy RN was notified that the site was bleeding. RN put on a pressure dressing over the site to stop bleeding. RN checked dressing site at end of shift, and bleeding has stopped. Will notify night RN of the intervention.

## 2019-03-17 NOTE — Plan of Care (Signed)
  Problem: Education: Goal: Knowledge of disease or condition will improve Outcome: Progressing   

## 2019-03-17 NOTE — Discharge Instructions (Signed)
Care After Your Loop Recorder   You have a [employer] Loop Recorder    Monitor your cardiac device site for redness, swelling, and drainage. Call the device clinic at [phone] if you experience these symptoms or fever/chills.   You may shower 1 day after your loop recorder implant. You may wash your incision with soap and water after 3 days. Do not submerge your wound for at least 1 week. Avoid lotions, ointments, or perfumes over your incision until it is well-healed.   You may use a hot tub or a pool AFTER your wound check appointment if the incision is completely closed.   Your device is MRI compatible.    Remote monitoring is used to monitor your cardiac device from home. This monitoring is scheduled every month days by our office. It allows [place] to keep an eye on the functioning of your device to ensure it is working properly.

## 2019-03-17 NOTE — NC FL2 (Signed)
[place] MEDICAID FL2 LEVEL OF CARE SCREENING TOOL     IDENTIFICATION  Patient Name: Rebecca Gallagher Birthdate: 1945/09/26 Sex: female Admission Date (Current Location): 03/15/2019  [facility] and [place] Number:  Herbalist and Address:  [facility]. [facility], [place] [address] [place], [place] [address]      Provider Number: [id number]  Attending Physician Name and Address:  Karie Kirks, DO  Relative Name and Phone Number:       Current Level of Care: Hospital Recommended Level of Care: [facility] Prior Approval Number:    Date Approved/Denied:   PASRR Number:    Discharge Plan: SNF    Current Diagnoses: Patient Active Problem List   Diagnosis Date Noted  . TIA (transient ischemic attack) 03/15/2019  . Hypertension 03/15/2019  . Hyperlipidemia 03/15/2019  . Diabetes mellitus ([place]) 03/15/2019  . Hypokalemia 03/15/2019  . Dehydration 03/15/2019  . Elevated troponin 03/15/2019  . Ischemic stroke ([place]) 03/15/2019    Orientation RESPIRATION BLADDER Height & Weight     Self, Situation, Place  Normal Incontinent Weight: 51.7 kg Height:  4\' 8"  (142.2 cm)  BEHAVIORAL SYMPTOMS/MOOD NEUROLOGICAL BOWEL NUTRITION STATUS      Continent Diet(Heart healthy with thin liquids)  AMBULATORY STATUS COMMUNICATION OF NEEDS Skin   Extensive Assist Verbally(delayed responses) Bruising(bilateral arms)                       Personal Care Assistance Level of Assistance  Bathing, Feeding, Dressing Bathing Assistance: Maximum assistance Feeding assistance: Limited assistance Dressing Assistance: Maximum assistance     Functional Limitations Info  Sight, Hearing, Speech Sight Info: Impaired(decreased vision in Rt eye) Hearing Info: Adequate Speech Info: Adequate    SPECIAL CARE FACTORS FREQUENCY  PT (By licensed PT), OT (By licensed OT), Speech therapy     PT Frequency: 5x/wk OT Frequency: 5x/wk     Speech Therapy Frequency: 5x/wk       Contractures Contractures Info: Not present    Additional Factors Info  Code Status, Allergies, Insulin Sliding Scale, Psychotropic Code Status Info: Full Allergies Info: Norco/ Pregabalin/ Valsartan/ Yellow dye/ Niacin/ Acetaminophen/ Interferons/ Nisoldipine/ Losartan Psychotropic Info: Zoloft 50 mg at bedtime Insulin Sliding Scale Info: Novolog 0-15 units SQ three times a day/ Novolog 0-5 units SQ at bedtime       Current Medications (03/17/2019):  This is the current hospital active medication list Current Facility-Administered Medications  Medication Dose Route Frequency Provider Last Rate Last Dose  .  stroke: mapping our early stages of recovery book   Does not apply Once Allred, Jeneen Rinks, MD      . acetaminophen (TYLENOL) tablet 650 mg  650 mg Oral Q4H PRN Allred, Jeneen Rinks, MD       Or  . acetaminophen (TYLENOL) solution 650 mg  650 mg Per Tube Q4H PRN Allred, Jeneen Rinks, MD       Or  . acetaminophen (TYLENOL) suppository 650 mg  650 mg Rectal Q4H PRN Allred, Jeneen Rinks, MD      . aspirin EC tablet 81 mg  81 mg Oral Daily Allred, Jeneen Rinks, MD   81 mg at 03/17/19 1018  . atorvastatin (LIPITOR) tablet 40 mg  40 mg Oral q1800 Allred, Jeneen Rinks, MD   40 mg at 03/16/19 1700  . clopidogrel (PLAVIX) tablet 75 mg  75 mg Oral Daily Allred, Jeneen Rinks, MD   75 mg at 03/17/19 1018  . enoxaparin (LOVENOX) injection 40 mg  40 mg Subcutaneous Q24H Allred,  Fayrene Fearing, MD   40 mg at 03/17/19 1018  . insulin aspart (novoLOG) injection 0-15 Units  0-15 Units Subcutaneous TID WC Allred, Fayrene Fearing, MD   3 Units at 03/16/19 1703  . insulin aspart (novoLOG) injection 0-5 Units  0-5 Units Subcutaneous QHS Allred, James, MD      . levETIRAcetam (KEPPRA) tablet 500 mg  500 mg Oral BID Allred, Fayrene Fearing, MD   500 mg at 03/17/19 1018  . sertraline (ZOLOFT) tablet 50 mg  50 mg Oral QHS Hillis Range, MD   50 mg at 03/16/19 2028  . traMADol (ULTRAM) tablet 50-100 mg  50-100 mg Oral Q6H PRN Hillis Range, MD   50 mg at 03/16/19 2028      Discharge Medications: Please see discharge summary for a list of discharge medications.  Relevant Imaging Results:  Relevant Lab Results:   Additional Information SS#: [id number]  Kermit Balo, RN

## 2019-03-17 NOTE — Consult Note (Signed)
Inpatient Rehabilitation Admissions Coordinator  Inpatient rehab consult received. I met with patient with her daughter at bedside for rehab assessment. Patient has moved from [place] to live with her family during [place] temporarily, as she awaits senior living apartment for which she is on a waiting list. Daughter states they are unable to arrange bedroom downstairs at the [place] and feels Mom will need more care than she can provide after this CVA. Daughter and patient prefer SNF rehab at this time. I have discussed with RN CM and SW. We will sign off at this time.   Danne Baxter, RN, MSN Rehab Admissions Coordinator [phone] 03/17/2019 12:29 PM

## 2019-03-17 NOTE — Interval H&P Note (Signed)
History and Physical Interval Note:  03/17/2019 11:07 AM  Rebecca Gallagher  has presented today for surgery, with the diagnosis of crytogenic stoke.  The various methods of treatment have been discussed with the patient and family. After consideration of risks, benefits and other options for treatment, the patient has consented to  Procedure(s): LOOP RECORDER INSERTION (N/A) as a surgical intervention.  The patient's history has been reviewed, patient examined, no change in status, stable for surgery.  I have reviewed the patient's chart and labs.  Questions were answered to the patient's satisfaction.     Thompson Grayer

## 2019-03-17 NOTE — TOC Initial Note (Signed)
Transition of Care [facility]) - Initial/Assessment Note    Patient Details  Name: Rebecca Gallagher MRN: [id number] Date of Birth: [birth year]  [facility] of [facility]) CM/SW Contact:    Pollie Friar, RN Phone Number: 03/17/2019, 1:19 PM  Clinical Narrative:                 CIR is the recommendations. Daughter states pt will not have needed support after a CIR stay.  CM met with the patient and her daughter: Rebecca Gallagher. They are interested in SNF rehab. They asked that patient be faxed out in the [facility] area. FL2 completed and pt faxed out. PASAR went to manual review.  TOC following and will provide bed offers when available.   Expected Discharge Plan: Skilled Nursing Facility Barriers to Discharge: Continued Medical Work up, SNF Pending bed offer   Patient Goals and CMS Choice   CMS Medicare.gov Compare Post Acute Care list provided to:: Patient Choice offered to / list presented to : Patient, Adult Children  Expected Discharge Plan and Services Expected Discharge Plan: [place] In-house Referral: Clinical Social Work Discharge Planning Services: CM Consult Post Acute Care Choice: [place] Living arrangements for the past 2 months: Apartment                                      Prior Living Arrangements/Services Living arrangements for the past 2 months: Apartment Lives with:: Adult Children Patient language and need for interpreter reviewed:: Yes(no needs) Do you feel safe going back to the place where you live?: Yes      Need for Family Participation in Patient Care: Yes (Comment)(pt requires 24 hour supervision) Care giver support system in place?: No (comment)(family not able to provide needed supervision)   Criminal Activity/Legal Involvement Pertinent to Current Situation/Hospitalization: No - Comment as needed  Activities of Daily Living Home Assistive Devices/Equipment: Eyeglasses ADL Screening (condition at time of  admission) Patient's cognitive ability adequate to safely complete daily activities?: Yes Is the patient deaf or have difficulty hearing?: No Does the patient have difficulty seeing, even when wearing glasses/contacts?: No Does the patient have difficulty concentrating, remembering, or making decisions?: No Patient able to express need for assistance with ADLs?: Yes Does the patient have difficulty dressing or bathing?: Yes Independently performs ADLs?: No Communication: Independent Dressing (OT): Needs assistance Is this a change from baseline?: Change from baseline, expected to last >3 days Grooming: Independent Feeding: Independent Bathing: Needs assistance Is this a change from baseline?: Change from baseline, expected to last >3 days Toileting: Needs assistance Is this a change from baseline?: Change from baseline, expected to last >3days In/Out Bed: Needs assistance Is this a change from baseline?: Change from baseline, expected to last >3 days Walks in Home: Needs assistance Is this a change from baseline?: Change from baseline, expected to last >3 days Does the patient have difficulty walking or climbing stairs?: Yes Weakness of Legs: Right Weakness of Arms/Hands: Right  Permission Sought/Granted                  Emotional Assessment Appearance:: Appears stated age Attitude/Demeanor/Rapport: Lethargic Affect (typically observed): Accepting Orientation: : Oriented to Self, Oriented to Place, Oriented to Situation   Psych Involvement: No (comment)  Admission diagnosis:  Transient alteration of awareness [R40.4] Hypertensive urgency [I16.0] Hip pain, acute, right [M25.551] Patient Active Problem List   Diagnosis Date Noted  .  TIA (transient ischemic attack) 03/15/2019  . Hypertension 03/15/2019  . Hyperlipidemia 03/15/2019  . Diabetes mellitus ([place]) 03/15/2019  . Hypokalemia 03/15/2019  . Dehydration 03/15/2019  . Elevated troponin 03/15/2019  . Ischemic  stroke ([place]) 03/15/2019   PCP:  No primary care provider on file. Pharmacy:   [facility] [place], [place] [facility] [place] [address] [place] [address] Phone: [phone] Fax: [phone]     Social Determinants of Health (SDOH) Interventions    Readmission Risk Interventions No flowsheet data found.

## 2019-03-17 NOTE — Evaluation (Signed)
Speech Language Pathology Evaluation Patient Details Name: Rebecca Gallagher MRN: [id number] DOB: 09-Mar-1946 Today's Date: 03/17/2019 Time: 3086-5784 SLP Time Calculation (min) (ACUTE ONLY): 35 min  Problem List:  Patient Active Problem List   Diagnosis Date Noted  . Seizure disorder ([place]) 03/17/2019  . TIA (transient ischemic attack) 03/15/2019  . Hypertension 03/15/2019  . Hyperlipidemia 03/15/2019  . Diabetes mellitus ([place]) 03/15/2019  . Hypokalemia 03/15/2019  . Dehydration 03/15/2019  . Elevated troponin 03/15/2019  . Ischemic stroke ([place]) 03/15/2019   Past Medical History:  Past Medical History:  Diagnosis Date  . Depression   . Diabetes mellitus without complication ([place])   . Hypertension    Past Surgical History:  Past Surgical History:  Procedure Laterality Date  . ABDOMINAL HYSTERECTOMY    . CATARACT EXTRACTION    . EYE SURGERY    . LOOP RECORDER INSERTION N/A 03/17/2019   Procedure: LOOP RECORDER INSERTION;  Surgeon: Thompson Grayer, MD;  Location: [facility];  Service: Cardiovascular;  Laterality: N/A;  . TOE AMPUTATION     HPI:  73 y.o. female with medical history significant of diabetes, hypertension, depression is a transfer from [place] for evaluation of TIA/stroke. 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 Previously, pt resided in senior citizen independent living and performed all ADLs independently. Moved to [place] due to [place] and lived with daughter and her family. 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 Pt reports difficulty remembering words post-stroke. Pt said she was in the hospital due to a fall and SLP provided further education on CVA and effects of LCVAs. Pts attention intact with deficits in awareness, receptive/expressive langauge, auditory comprehension, and  visuospatial/executive function. Expressive language characterized by pauses between words and insertion of empty speech to preserve fluency. Given [place] task to connect numbers to letters in ascending order, pt required extensive processing time with max cueing necessary for each step. Pts speech intelligibility impacted by reduced rate and movement of articulators with hoarse/low intensity vocal quality. Pt requires continued SLP services for mild-mod aphasia, dysarthria, and cognition.     SLP Assessment  SLP Recommendation/Assessment: Patient needs continued Speech Lanaguage Pathology Services SLP Visit Diagnosis: Aphasia (R47.01);Dysarthria and anarthria (R47.1);Cognitive communication deficit (R41.841)    Follow Up Recommendations  Inpatient Rehab    Frequency and Duration min 2x/week  2 weeks      SLP Evaluation Cognition  Overall Cognitive Status: Impaired/Different from baseline Arousal/Alertness: Awake/alert Orientation Level: Oriented to place;Oriented to situation Attention: Sustained Sustained Attention: Appears intact Memory: (TBA) Awareness: Impaired Awareness Impairment: Intellectual impairment;Anticipatory impairment Problem Solving: Impaired Problem Solving Impairment: Verbal complex Executive Function: Sequencing;Organizing Sequencing: Impaired Sequencing Impairment: Verbal basic Organizing: Impaired Organizing Impairment: Verbal basic Safety/Judgment: Impaired       Comprehension  Auditory Comprehension Overall Auditory Comprehension: Appears within functional limits for tasks assessed Yes/No Questions: Not tested Commands: Within Functional Limits Conversation: Complex Interfering Components: Processing speed;Motor planning EffectiveTechniques: Extra processing time Visual Recognition/Discrimination Discrimination: Not tested Reading Comprehension Reading Status: Not tested(TBA)    Expression Expression Primary Mode of Expression: Verbal Verbal  Expression Overall Verbal Expression: Impaired Initiation: Impaired Level of Generative/Spontaneous Verbalization: Conversation Repetition: (NT) Naming: Impairment Responsive: Not tested Confrontation: Impaired(2/3 pictures) Divergent: Not tested Verbal Errors: Aware of errors;Semantic paraphasias Pragmatics: No impairment Interfering Components: Speech intelligibility Effective Techniques: Open ended questions  Non-Verbal Means of Communication: Not applicable Written Expression Dominant Hand: Right Written Expression: Not tested   Oral / Motor  Oral Motor/Sensory Function Overall Oral Motor/Sensory Function: Generalized oral weakness Facial ROM: Within Functional Limits Facial Symmetry: Within Functional Limits Facial Strength: Within Functional Limits Facial Sensation: Within Functional Limits Lingual ROM: Other (Comment)(Reduced laterally) Lingual Symmetry: Within Functional Limits Lingual Strength: Reduced Lingual Sensation: Within Functional Limits Velum: Within Functional Limits Mandible: Within Functional Limits Motor Speech Overall Motor Speech: Impaired Respiration: Within functional limits Phonation: Hoarse;Low vocal intensity Resonance: Within functional limits Articulation: Impaired Level of Impairment: Sentence Intelligibility: Intelligibility reduced Word: 75-100% accurate Phrase: 75-100% accurate Sentence: 75-100% accurate Conversation: 75-100% accurate Motor Planning: Witnin functional limits Motor Speech Errors: Aware   GO                      03/17/2019, 4:44 PM

## 2019-03-17 NOTE — Progress Notes (Signed)
Occupational Therapy Note  OT eval completed, full write up to follow.  Recommend post acute rehab.  Lucille Passy, OTR/L Runner, broadcasting/film/video [phone] Office [phone]

## 2019-03-17 NOTE — Progress Notes (Signed)
Patient dressing saturated with blood dressing changed will continue to monitor.  Surles, Tivis Ringer, RN

## 2019-03-17 NOTE — Progress Notes (Signed)
PROGRESS NOTE  [name] HYQ:[id number] DOB: September 06, 1945 DOA: 03/15/2019 PCP: No primary care provider on file.  Brief History   Rebecca Gallagher is a 73 y.o. female with medical history significant of diabetes, hypertension, depression is a transfer from med [employer] for evaluation of TIA/stroke.  Her initial work-up at [facility] was concerning for possible hypertension emergency as patient was hypertensive upon arrival and her blood pressure was in 200s However EDP reported change in neuro status-neurology on call was consulted who recommended state CTA and perfusion-and patient was transferred to [facility] for further evaluation and management.  Upon my evaluation: Patient reports right-sided weakness which started  suddenly this morning.  She denies fall, head trauma, loss of consciousness, headache, blurry vision, dizziness, lightheadedness, fall, chest pain, shortness of breath, palpitation, nausea, vomiting, fever, chills, diarrhea, constipation, epigastric pain, hematemesis or melena.  She lives with her daughter and she is former smoking, drinks alcohol 2-3 times per week, denies illicit drug use.  She does not use walker or cane at home.  She is compliant with her home medication.  ED Course: Patient's blood pressure found to be elevated.  Evaluated by neurology who recommended full stroke work-up along with EEG.  The patient is admitted to a telemetry bed to undergo a stroke work up. MR of the brain was obtained that demonstrated acute infarct of the left posterior MCA territory without hemorrhage. It also demonstrated chronic microvascular ischemic change in the white matter. CTA of the head and neck has been obtained that demonstrated subacute infarct of the left occipital parietal lobe, Atherosclerotic calcification of the carotid bifurcation bilaterally without significant stenosis. There is mild stenosis of the right vertebral artery. The left vertebral artery is widely  patent. 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 There is mild stenosis in the left MCA bifurcation. There is moderate stenosis of the posterior cerebral artery bilaterally. CT perfusion was negatiuve for acute infarct or ischemia.  Echocardiogram demonstrated EF of 60-65% with diastolic dysfunction and LVH. No intracardiac source of embolus is identified. There is moderate aortic stenosis.  He has been evaluated by Dr. Johney Frame for implanted loop recorder. He will undergo TEE and implantation of the device today.  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 Neurology has started the patient on Keppra and placed him on seizure precautions.   Consultants  . Neurology . Cardiology - EP  Procedures  . EEG -  left parietal epilleptogenicity . TEE - pending . Implantable Loop Recording Device - pending  Antibiotics   Anti-infectives (From admission, onward)   None     Subjective  The patient is resting comfortably. No new complaints.   Objective   Vitals:  Vitals:   03/17/19 0751 03/17/19 1158  BP: (!) 175/67 (!) 206/61  Pulse: 62 69  Resp: 17 18  Temp: 98.4 F (36.9 C) 99.1 F (37.3 C)  SpO2: 98% 94%   Exam:  Constitutional:  . The patient is awake, alert, and oriented x 3. No acute distress. Respiratory:  . No increased work of breathing. . No wheezes, rales, or rhonchi . No tactile fremitus Cardiovascular:  . Regular rate and rhythm . No ectopy or gallups. . There is a 3/5 systolic ejection murmur with radiation to carotids bilaterally. . No lateral PMI. No thrills. Abdomen:  . Abdomen is soft, non-tender, non-distended . No hernias, masses, or organomegaly . Normoactive bowel sounds.  Musculoskeletal:  . No cyanosis, clubbing, or edema Skin:  . No  rashes, lesions, ulcers . palpation of skin: no induration or nodules Neurologic:  . Right upper extremity weakness. Expressive aphasia. Right sided paresthesia. There is  still mild right facial droop.  I have personally reviewed the following:   Today's Data  . Vitals  Imaging  . CTA head and neck, CT cerebral perfusion, MRI  Cardiology Data  . Echocardiogram . TEE - pending  Scheduled Meds: .  stroke: mapping our early stages of recovery book   Does not apply Once  . aspirin EC  81 mg Oral Daily  . atorvastatin  40 mg Oral q1800  . clopidogrel  75 mg Oral Daily  . enoxaparin (LOVENOX) injection  40 mg Subcutaneous Q24H  . insulin aspart  0-15 Units Subcutaneous TID WC  . insulin aspart  0-5 Units Subcutaneous QHS  . levETIRAcetam  500 mg Oral BID  . sertraline  50 mg Oral QHS   Principal Problem:   Ischemic stroke (HCC) Active Problems:   TIA (transient ischemic attack)   Hypertension   Hyperlipidemia   Diabetes mellitus (HCC)   Hypokalemia   Dehydration   Elevated troponin   Seizure disorder  LOS: 2 days   A & P  Acute infarction of left posterior MCA: Pt is being monitored on telemetry. Neurology has been consulted. Stroke work up is in place. MR of the brain was obtained that demonstrated acute infarct of the left posterior MCA territory without hemorrhage. It also demonstrated chronic microvascular ischemic change in the white matter. CTA of the head and neck has been obtained that demonstrated subacute infarct of the left occipital parietal lobe, Atherosclerotic calcification of the carotid bifurcation bilaterally without significant stenosis. There is mild stenosis of the right vertebral artery. The left vertebral artery is widely patent. 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 There is mild stenosis in the left MCA bifurcation. There is moderate stenosis of the posterior cerebral artery bilaterally. CT perfusion was negatiuve for acute infarct or ischemia. EEG has been performed and suggested left temporal epileptogenicity. The patient has been placed on keppra 500 mg bid and seizure precautions.   Echocardiogram demonstrated EF of 60-65% with diastolic dysfunction and LVH. No intracardiac source of embolus is identified. There is moderate aortic stenosis.  Arrhythmia: Neurology has expressed the concern that atrial fibrillation could be behind the patient's stroke. Cardiac electrophysiology has been consulted and will take the patient for TEE and implantation of cardiac loop recorder.  Seizure Disorder: EEG demonstrated epilleptogenicity. Neurology has started the patient on Keppra and placed him on seizure precautions.  Home antihypertensives have been held for the current time. Allow for permissive hypertension for the first 24-48h - only treat PRN if SBP >220 mmHg or diastolic blood pressure >120. Blood pressures can be gradually normalized to SBP<140 upon discharge.  The patient has been started on Aspirin 300 mg per rectal once, atorvastatin 80 mg once daily.  Hyperlipidemia as determined by lipid panel: She as been started on atorvastatin 80 mg daily.  SLP has been consulted for swallow evaluation. Still pending.  Hypertension: Elevated, but we are allowing  permissive hypertension for the first 24-48h - only treat PRN if SBP >220 mmHg or diastolic blood pressure >120. Blood pressures can be gradually normalized to SBP<140 upon discharge. Monitor.  Hyperlipidemia: Check lipid panel. Start on atorvastatin 80 mg once daily.  Diabetes mellitus: Hemoglobin is 7.5. The patient's glucoses are being followed with FSBS and SSI. Invokana is being held.  Hypokalemia: Likely secondary  to dehydration. Supplement and monitor.  Elevated troponin: Likely secondary to uncontrolled hypertension. No chest pain. Troponins flat as trended over time. EKG demonstrates left bundle branch block. Monitor telemetry.  Dehydration: Resolved. Stop IV fluids.  Depression: Continue Zoloft.  I have seen and examined this patient myself. I have spent 32 minutes in her evaluation and plan.  DVT  prophylaxis: TED/SCD Code Status: Full code Family Communication: Family at bedside. Disposition Plan: TBD  Ava Swayze, DO [facility] Direct contact: see [email or web address]  7PM-7AM contact night coverage as above  03/17/2019, 2:18 PM  LOS: 1 day

## 2019-03-17 NOTE — Progress Notes (Signed)
PT Cancellation Note  Patient Details Name: Rebecca Gallagher MRN: [id number] DOB: [birth year]   Cancelled Treatment:    Reason Eval/Treat Not Completed: Other (comment)(pt with other discipline, SLP). PT attempted to see patient for treatment however upon arrival SLP in room performing swallow evaluation. PT will attempt to follow up as time allows.   Zenaida Niece 03/17/2019, 3:14 PM

## 2019-03-18 DIAGNOSIS — R778 Other specified abnormalities of plasma proteins: Secondary | ICD-10-CM

## 2019-03-18 DIAGNOSIS — E860 Dehydration: Secondary | ICD-10-CM

## 2019-03-18 DIAGNOSIS — I10 Essential (primary) hypertension: Secondary | ICD-10-CM

## 2019-03-18 LAB — GLUCOSE, CAPILLARY
Glucose-Capillary: 97 mg/dL (ref 70–99)
Glucose-Capillary: 112 mg/dL — ABNORMAL HIGH (ref 70–99)
Glucose-Capillary: 136 mg/dL — ABNORMAL HIGH (ref 70–99)
Glucose-Capillary: 171 mg/dL — ABNORMAL HIGH (ref 70–99)
Glucose-Capillary: 94 mg/dL (ref 70–99)

## 2019-03-18 LAB — SARS CORONAVIRUS 2 (TAT 6-24 HRS): SARS Coronavirus 2: NEGATIVE

## 2019-03-18 MED ORDER — POLYETHYLENE GLYCOL 3350 17 G PO PACK
17.0000 g | PACK | Freq: Every day | ORAL | Status: DC | PRN
  Administered 2019-03-18: 16:00:00 17 g via ORAL
  Filled 2019-03-18 (×2): qty 1

## 2019-03-18 MED ORDER — LABETALOL HCL 5 MG/ML IV SOLN: 10.0000 mg | Freq: Once | INTRAVENOUS | Status: DC | PRN

## 2019-03-18 NOTE — Progress Notes (Signed)
Physical Therapy Treatment Patient Details Name: Rebecca Gallagher MRN: [id number] DOB: 1945/06/12 Today's Date: 03/18/2019    History of Present Illness  73 y.o. female with medical history significant of diabetes, hypertension, depression is a transfer from [place] for evaluation of TIA/stroke. 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    PT Comments    Pt tolerated treatment well, with improved sitting balance. Pt continues to require physical assistance for all functional mobility at this time. Pt participates in HEP with PT emphasizing ROM of ankle and knee joints as pt has increased tone in R plantar flexors. Pt will benefit from continued PT POC to reduce falls risk and aide in a return to independent mobility.   Follow Up Recommendations  SNF(pt/family electing for SNF instead of CIR)     Equipment Recommendations  Rolling walker with 5" wheels;3in1 (PT)    Recommendations for Other Services       Precautions / Restrictions Precautions Precautions: Fall Restrictions Weight Bearing Restrictions: No    Mobility  Bed Mobility Overal bed mobility: Needs Assistance Bed Mobility: Supine to Sit     Supine to sit: Min assist;HOB elevated     General bed mobility comments: pt utilizing bed rail to pull toward EOB  Transfers Overall transfer level: Needs assistance Equipment used: None Transfers: Sit to/from [facility] Sit to Stand: Mod assist Stand pivot transfers: Mod assist          Ambulation/Gait                 Stairs             Wheelchair Mobility    Modified Rankin (Stroke Patients Only) Modified Rankin (Stroke Patients Only) Pre-Morbid Rankin Score: No symptoms Modified Rankin: Moderately severe disability     Balance Overall balance assessment: Needs assistance Sitting-balance support: Bilateral upper extremity supported;Feet supported Sitting balance-Leahy Scale: Poor Sitting balance - Comments: pt  requires minG for static sitting balance   Standing balance support: Bilateral upper extremity supported Standing balance-Leahy Scale: Poor Standing balance comment: pt requires min-modA for static standing                            Cognition Arousal/Alertness: Awake/alert Behavior During Therapy: WFL for tasks assessed/performed Overall Cognitive Status: Impaired/Different from baseline Area of Impairment: Memory;Problem solving                             Problem Solving: Slow processing        Exercises General Exercises - Lower Extremity Ankle Circles/Pumps: AROM;10 reps;Right Gluteal Sets: AROM;10 reps;Both Long Arc Quad: AROM;10 reps;Right    General Comments        Pertinent Vitals/Pain Pain Assessment: Faces Faces Pain Scale: Hurts little more Pain Location: r foot Pain Descriptors / Indicators: Aching Pain Intervention(s): Limited activity within patient's tolerance    Home Living                      Prior Function            PT Goals (current goals can now be found in the care plan section) Acute Rehab PT Goals Patient Stated Goal: to be able to do things again  Progress towards PT goals: Progressing toward goals    Frequency    Min 3X/week      PT Plan Discharge plan needs  to be updated;Frequency needs to be updated    Co-evaluation              AM-PAC PT "6 Clicks" Mobility   Outcome Measure  Help needed turning from your back to your side while in a flat bed without using bedrails?: A Little Help needed moving from lying on your back to sitting on the side of a flat bed without using bedrails?: A Little Help needed moving to and from a bed to a chair (including a wheelchair)?: A Lot Help needed standing up from a chair using your arms (e.g., wheelchair or bedside chair)?: A Lot Help needed to walk in hospital room?: A Lot Help needed climbing 3-5 steps with a railing? : Total 6 Click Score: 13     End of Session Equipment Utilized During Treatment: Gait belt Activity Tolerance: Patient tolerated treatment well Patient left: in chair;with call bell/phone within reach Nurse Communication: Mobility status PT Visit Diagnosis: Other abnormalities of gait and mobility (R26.89)     Time: 6283-1517 PT Time Calculation (min) (ACUTE ONLY): 26 min  Charges:  $Therapeutic Exercise: 8-22 mins $Therapeutic Activity: 8-22 mins                     Arlyss Gandy, PT, DPT Acute Rehabilitation Pager: [phone]    Arlyss Gandy 03/18/2019, 10:49 AM

## 2019-03-18 NOTE — Progress Notes (Signed)
  Came back to assess pt wound site with bleeding overnight.   Steristrips previously saturated with blood, but no new blood. Clean gauze that had been placed over top without any continue bleeding.  Site looks stable with dependent ecchymosis underneath. No hematoma.   Continue to keep site clean and dry. If bleeds again, hold gentle but firm pressure over site for 5-10 minutes. No need for pressure dressing.   Please call with questions. Pt stable for discharge to SNF. Had initially planned CIR, but family worried they would not have the required support at home after a CIR stay.   She is pending discharge to SNF when bed available.   [address] [place]  Pager: [phone]  03/18/2019 9:39 AM

## 2019-03-18 NOTE — Plan of Care (Signed)
  Problem: Self-Care: Goal: Ability to participate in self-care as condition permits will improve Outcome: Progressing   

## 2019-03-18 NOTE — Progress Notes (Signed)
Orthopedic Tech Progress Note Patient Details:  Rebecca Gallagher 02-May-1946 [id number]  Ortho Devices Type of Ortho Device: Prafo boot/shoe Ortho Device/Splint Location: Bilateral boots Ortho Device/Splint Interventions: Application   Post Interventions Patient Tolerated: Well Instructions Provided: Care of device   [place] Pink 03/18/2019, 10:45 AM

## 2019-03-18 NOTE — Progress Notes (Signed)
Occupational Therapy Evaluation (late entry)  Pt admitted with the below listed diagnosis and the below listed deficits.  She presents with Rt hemiparesis, impaired balance, ? Rt field deficit, and communication and cognitive deficits.  She currently requires min - total A for ADLs, and min - mod A for functional transfers.  She lived with her daughter and was fully independent with ADLs and IADLs PTA.  She will need post acute rehab - optimally CIR, but it appears that is not an option, therefore, CIR recommended.  Will follow acutely.   Recommend SNF     03/17/19 1800  OT Visit Information  Last OT Received On 03/17/19  Assistance Needed +1  History of Present Illness  73 y.o. female with medical history significant of diabetes, hypertension, depression is a transfer from [facility] [employer] for evaluation of TIA/stroke. 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  Precautions  Precautions Fall  Home Living  Family/patient expects to be discharged to: Private residence  Living Arrangements Children  Available Help at Discharge Available PRN/intermittently;Family  Type of Home House  Home Access Stairs to enter  Entrance Stairs-Number of Steps 2  Entrance Stairs-Rails None  Home Layout Two level;Bed/bath upstairs  Alternate Level Stairs-Number of Steps 13  Alternate Level Stairs-Rails Left  Bathroom Shower/Tub Walk-in Pension scheme manager Yes  Home Equipment None  Additional Comments was living independently prior to COVID.  Moved into daughter's home recently, but was fully independent   Prior Function  Level of Independence Independent  Comments drives, cooks, Investment banker, operational Expressive difficulties  Pain Assessment  Pain Assessment No/denies pain  Cognition  Arousal/Alertness Awake/alert  Behavior During Therapy WFL for tasks assessed/performed  Overall Cognitive Status Impaired/Different from baseline  Area of  Impairment Attention;Problem solving  Current Attention Level Selective  Problem Solving Slow processing;Requires verbal cues  General Comments Pt is slow to follow commands, and cognition is difficult to accurately assess due to language deficits   Upper Extremity Assessment  Upper Extremity Assessment RUE deficits/detail  RUE Deficits / Details Pt demonstrates movement in Brunnstrom stage IV - moving out of synergies.  She is able to perfom forward reach to ~70* with flexor patterns noted.  She has mass grasp/release of hand   RUE Sensation decreased light touch  RUE Coordination decreased fine motor;decreased gross motor  Lower Extremity Assessment  Lower Extremity Assessment Defer to PT evaluation  Cervical / Trunk Assessment  Cervical / Trunk Assessment Other exceptions  Cervical / Trunk Exceptions Pt demonstrates active shortening Lt trunk with elongation of Rt, elevation of Rt shoulder/scap and flaring of Rt ribs laterally.  Decreased activivation of Rt trunk and decreased disassociation of upper and lower trunk movements   ADL  Overall ADL's  Needs assistance/impaired  Eating/Feeding Set up;Sitting  Grooming Wash/dry hands;Wash/dry face;Oral care;Brushing hair;Minimal assistance;Sitting  Grooming Details (indicate cue type and reason) assist for sitting balance   Upper Body Bathing Moderate assistance;Sitting  Lower Body Bathing Maximal assistance  Upper Body Dressing  Maximal assistance;Sitting  Lower Body Dressing Total assistance;Sit to/from Scientist, research (life sciences) Moderate assistance;Stand-pivot;BSC  Toileting- Clothing Manipulation and Hygiene Maximal assistance;Sit to/from stand  Functional mobility during ADLs Moderate assistance  Vision- History  Baseline Vision/History Wears glasses  Wears Glasses At all times  Vision- Assessment  Vision Assessment? Yes  Eye Alignment [facility]  Ocular Range of Motion [facility]  Tracking/Visual Pursuits Able to track stimulus in all quads without  difficulty  Visual Fields Right visual field deficit  Additional Comments confrontation testing yielded inconsistent results ? Rt central field deficit   Perception  Perception Tested? Yes  Praxis  Praxis tested? WFL  Bed Mobility  Overal bed mobility Needs Assistance  Bed Mobility Supine to Sit;Sit to Supine  Supine to sit Min assist  General bed mobility comments Pt able to bridge to scoot her hips in the bed.  She requires assist to lift trunk from bed   Transfers  Overall transfer level Needs assistance  Equipment used 1 person hand held assist  Transfers Sit to/from [employer] Transfers  Sit to Stand Min assist  Stand pivot transfers Mod assist  General transfer comment assist for balance and facilitation of Rt hip extensors and Rt knee  Balance  Overall balance assessment Needs assistance  Sitting-balance support Feet supported;Single extremity supported  Sitting balance-Leahy Scale Poor  Sitting balance - Comments Pt initially required up to min A for static sitting, but after working on weigh shifts and activation of trunk musculature, she was able to progress to min guard assist with no LOB   Standing balance support Bilateral upper extremity supported  Standing balance-Leahy Scale Poor  Standing balance comment min A for static standing   General Comments  General comments (skin integrity, edema, etc.) upon return to supine, noted pt's gown saturated with blood from loop recorder site.  RN notified and in room.  Pressure applied   OT - End of Session  Activity Tolerance Patient tolerated treatment well  Patient left in bed;with call bell/phone within reach;with nursing/sitter in room  Nurse Communication Mobility status  OT Assessment  OT Recommendation/Assessment Patient needs continued OT Services  OT Visit Diagnosis Unsteadiness on feet (R26.81);Hemiplegia and hemiparesis  Hemiplegia - Right/Left Right  Hemiplegia - caused by Cerebral infarction  OT Problem  List Decreased strength;Decreased range of motion;Decreased activity tolerance;Impaired balance (sitting and/or standing);Impaired vision/perception;Decreased coordination;Decreased cognition;Impaired sensation;Impaired tone;Impaired UE functional use  Barriers to Discharge Decreased caregiver support  OT Plan  OT Frequency (ACUTE ONLY) Min 2X/week  OT Treatment/Interventions (ACUTE ONLY) Self-care/ADL training;Neuromuscular education;DME and/or AE instruction;Therapeutic activities;Cognitive remediation/compensation;Visual/perceptual remediation/compensation;Patient/family education;Balance training  AM-PAC OT "6 Clicks" Daily Activity Outcome Measure (Version 2)  Help from another person eating meals? 3  Help from another person taking care of personal grooming? 3  Help from another person toileting, which includes using toliet, bedpan, or urinal? 2  Help from another person bathing (including washing, rinsing, drying)? 2  Help from another person to put on and taking off regular upper body clothing? 2  Help from another person to put on and taking off regular lower body clothing? 1  6 Click Score 13  OT Recommendation  Follow Up Recommendations SNF  OT Equipment None recommended by OT  Individuals Consulted  Consulted and Agree with Results and Recommendations Patient  Acute Rehab OT Goals  Patient Stated Goal to be able to do things again   OT Goal Formulation With patient  Time For Goal Achievement 04/01/19  Potential to Achieve Goals Good  OT Time Calculation  OT Start Time (ACUTE ONLY) 1718  OT Stop Time (ACUTE ONLY) 1801  OT Time Calculation (min) 43 min  OT General Charges  $OT Visit 1 Visit  OT Evaluation  $OT Eval Moderate Complexity 1 Mod  OT Treatments  $Neuromuscular Re-education 23-37 mins  Written Expression  Dominant Hand Right  Lucille Passy, OTR/L [place] Pager [phone] Office [phone]

## 2019-03-18 NOTE — TOC Progression Note (Signed)
Transition of Care [facility]) - Progression Note    Patient Details  Name: Rebecca Gallagher MRN: [id number] Date of Birth: 1945/07/08  Transition [facility]) CM/SW Contact  Pollie Friar, RN Phone Number: 03/18/2019, 1:27 PM  Clinical Narrative:    Patient and daughter have chosen Health and safety inspector for SNF rehab. Pts PASAR went to manual review and information requested sent to PASAR. PASAR pushing back and asking for more information. Insurance authorization started this am with [facility].  TOC following.   Expected Discharge Plan: [place] Barriers to Discharge: Continued Medical Work up, SNF Pending bed offer  Expected Discharge Plan and Services Expected Discharge Plan: [place] In-house Referral: Clinical Social Work Discharge Planning Services: CM Consult Post Acute Care Choice: [place] Living arrangements for the past 2 months: Apartment                                       Social Determinants of Health (SDOH) Interventions    Readmission Risk Interventions No flowsheet data found.

## 2019-03-18 NOTE — Care Management Important Message (Signed)
Important Message  Patient Details  Name: Rebecca Gallagher MRN: [id number] Date of Birth: 05-14-1946   Medicare Important Message Given:  Yes     Iris Bratton 03/18/2019, 2:51 PM

## 2019-03-18 NOTE — Progress Notes (Signed)
PROGRESS NOTE    Rebecca Gallagher  UJW:[id number] DOB: [birth year] DOA: 03/15/2019 PCP: No primary care provider on file.    Brief Narrative:   femalewith medical history significant ofdiabetes, hypertension, depression is a transfer from [facility] for evaluation of TIA/stroke. Her initial work-up at [facility] was concerning for possible hypertension emergency as patient was hypertensive upon arrival and her blood pressure was in 200s However EDP reported change in neuro status-neurology on call was consulted who recommended state CTA and perfusion-and patient was transferred to [facility] for further evaluation and management.  Upon my evaluation: Patient reports right-sided weakness which started suddenly this morning. She denies fall, head trauma, loss of consciousness, headache, blurry vision, dizziness, lightheadedness, fall, chest pain, shortness of breath, palpitation, nausea, vomiting, fever, chills, diarrhea, constipation, epigastric pain, hematemesis or melena.  She lives with her daughter and she is former smoking, drinks alcohol 2-3 times per week, denies illicit drug use. She does not use walker or cane at home. She is compliant with her home medication.  ED Course:Patient's blood pressure found to be elevated. Evaluated by neurology who recommended full stroke work-up along with EEG.   Interim History: The patient is admitted to a telemetry bed to undergo a stroke work up. MR of the brain was obtained that demonstrated acute infarct of the left posterior MCA territory without hemorrhage. It also demonstrated chronic microvascular ischemic change in the white matter. CTA of the head and neck has been obtained that demonstrated subacute infarct of the left occipital parietal lobe, Atherosclerotic calcification of the carotid bifurcation bilaterally without significant stenosis. There is mild stenosis of the right vertebral artery. The left vertebral artery is  widely patent. 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 There is mild stenosis in the left MCA bifurcation. There is moderate stenosis of the posterior cerebral artery bilaterally. CT perfusion was negatiuve for acute infarct or ischemia.  Echocardiogram demonstrated EF of 60-65% with diastolic dysfunction and LVH. No intracardiac source of embolus is identified. There is moderate aortic stenosis.  He has been evaluated by Dr. Johney Frame for implanted loop recorder. He will undergo TEE and implantation of the device 10/8.  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 Neurology has started the patient on Keppra and placed him on seizure precautions.   Assessment & Plan:   Principal Problem:   Ischemic stroke [facility]) Active Problems:   Hypertension   Hyperlipidemia   Diabetes mellitus (HCC)   Hypokalemia   Dehydration   Elevated troponin   Seizure disorder (HCC)   Acute infarction of left posterior MCA: Pt is being monitored on telemetry. Neurology has been consulted. Stroke work up is in place. MR of the brain was obtained that demonstrated acute infarct of the left posterior MCA territory without hemorrhage. It also demonstrated chronic microvascular ischemic change in the white matter. CTA of the head and neck has been obtained that demonstrated subacute infarct of the left occipital parietal lobe, Atherosclerotic calcification of the carotid bifurcation bilaterally without significant stenosis. There is mild stenosis of the right vertebral artery. The left vertebral artery is widely patent. 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 There is mild stenosis in the left MCA bifurcation. There is moderate stenosis of the posterior cerebral artery bilaterally. CT perfusion was negatiuve for acute infarct or ischemia. EEG has been performed and suggested left temporal epileptogenicity. The patient has been placed on keppra 500 mg bid and  seizure precautions.  Echocardiogram demonstrated EF of 60-65% with diastolic dysfunction and LVH. No intracardiac source of embolus is identified. There is moderate aortic stenosis. SLP has been consulted for swallow evaluation --> recommended inpatient Rehab. Pt/family electing for SNF instead of [facility]) -on plavix and ASA, lipitor -PT recommended SNF(pt/family electing for SNF instead of [facility])  Arrhythmia: Neurology has expressed the concern that atrial fibrillation could be behind the patient's stroke. Cardiac electrophysiology has been consulted and will take the patient for TEE. Implantation of cardiac loop recorder was placed by Dr. Hillery Jacks on 03/17/19.  Seizure Disorder: EEG demonstrated epilleptogenicity. Neurology has started the patient on Keppra and placed him on seizure precautions.  HTN: bp 109/48 -Irbesartan  Hyperlipidemia -lipiotor  Dabetes mellitus: Hemoglobin is 7.5 03/15/19. Vikki Ports is being held. Blood sugar 112-171 -SSI  Elevated troponin: Likely secondary to uncontrolled hypertension. No chest pain. Troponins flat as trended over time. EKG demonstrates left bundle branch block. Monitor telemetry.  Depression: Continue Zoloft.  DVT prophylaxis: sq Lovenox Code Status: fall Family Communication: daughter Disposition Plan:  Likely d/c to SNF tomorrow, waiting for SNF bed.   Consultants:   Card   neurology  Procedures:   EEG -  left parietal epilleptogenicity  TEE - pending  LOOP RECORDER INSERTION 10/8  Antimicrobials:  Subjective:  Patient reports constipation, but no nausea vomiting, diarrhea or abdominal pain.  No fever or chills.  Denies chest pain.  Objective: Vitals:   03/18/19 1000 03/18/19 1130 03/18/19 1134 03/18/19 1940  BP: (!) 188/90 (!) 198/70 (!) 123/109 (!) 150/46  Pulse:  67 66 (!) 58  Resp:  16  18  Temp:  98.4 F (36.9 C)  97.7 F (36.5 C)  TempSrc:  Oral  Oral  SpO2:  98%  95%  Weight:      Height:         Intake/Output Summary (Last 24 hours) at 03/18/2019 2101 Last data filed at 03/18/2019 1830 Gross per 24 hour  Intake 542 ml  Output 1250 ml  Net -708 ml   Filed Weights   03/15/19 1041 03/15/19 1059  Weight: 51.7 kg 51.7 kg    Examination: Physical Exam:  General: Not in acute distress HEENT: PERRL, EOMI, no scleral icterus, No JVD or bruit Cardiac: S1/S2, RRR, No murmurs, gallops or rubs Pulm: Good air movement bilaterally. Clear to auscultation bilaterally. No rales, wheezing, rhonchi or rubs. Abd: Soft, nondistended, nontender, no rebound pain, no organomegaly, BS present Ext: No edema. 2+DP/PT pulse bilaterally Musculoskeletal: No joint deformities, erythema, or stiffness, ROM full Skin: No rashes.  Neuro: Alert and oriented X3, cranial nerves II-XII grossly intact, muscle strength 5/5 in all extremeties, sensation to light touch intact. Brachial reflex 2+ bilaterally. Knee reflex 1+ bilaterally. Negative Babinski's sign. Normal finger to nose test. Psych: Patient is not psychotic, no suicidal or hemocidal ideation.    Data Reviewed: I have personally reviewed following labs and imaging studies  CBC: Recent Labs  Lab 03/15/19 1158 03/15/19 1722  WBC 8.0 7.9  NEUTROABS 5.5  --   HGB 14.1 13.4  HCT 42.4 38.7  MCV 84.1 83.6  PLT 264 255   Basic Metabolic Panel: Recent Labs  Lab 03/15/19 1158 03/15/19 1704  NA 138  --   K 3.4*  --   CL 105  --   CO2 24  --   GLUCOSE 156*  --   BUN 16  --   CREATININE 0.88 0.94  CALCIUM 9.8  --    GFR: Estimated Creatinine Clearance:  35.8 mL/min (by C-G formula based on SCr of 0.94 mg/dL). Liver Function Tests: No results for input(s): AST, ALT, ALKPHOS, BILITOT, PROT, ALBUMIN in the last 168 hours. No results for input(s): LIPASE, AMYLASE in the last 168 hours. No results for input(s): AMMONIA in the last 168 hours. Coagulation Profile: No results for input(s): INR, PROTIME in the last 168 hours. Cardiac Enzymes: No  results for input(s): CKTOTAL, CKMB, CKMBINDEX, TROPONINI in the last 168 hours. BNP (last 3 results) No results for input(s): PROBNP in the last 8760 hours. HbA1C: No results for input(s): HGBA1C in the last 72 hours. CBG: Recent Labs  Lab 03/17/19 2107 03/17/19 2235 03/18/19 0615 03/18/19 1133 03/18/19 1834  GLUCAP 97 92 112* 136* 171*   Lipid Profile: Recent Labs    03/16/19 0500  CHOL 216*  HDL 51  LDLCALC 144*  TRIG 106  CHOLHDL 4.2   Thyroid Function Tests: No results for input(s): TSH, T4TOTAL, FREET4, T3FREE, THYROIDAB in the last 72 hours. Anemia Panel: No results for input(s): VITAMINB12, FOLATE, FERRITIN, TIBC, IRON, RETICCTPCT in the last 72 hours. Sepsis Labs: No results for input(s): PROCALCITON, LATICACIDVEN in the last 168 hours.  Recent Results (from the past 240 hour(s))  SARS CORONAVIRUS 2 (TAT 6-24 HRS) Nasopharyngeal Nasopharyngeal Swab     Status: None   Collection Time: 03/15/19  1:46 PM   Specimen: Nasopharyngeal Swab  Result Value Ref Range Status   SARS Coronavirus 2 NEGATIVE NEGATIVE Final    Comment: (NOTE) SARS-CoV-2 target nucleic acids are NOT DETECTED. The SARS-CoV-2 RNA is generally detectable in upper and lower respiratory specimens during the acute phase of infection. Negative results do not preclude SARS-CoV-2 infection, do not rule out co-infections with other pathogens, and should not be used as the sole basis for treatment or other patient management decisions. Negative results must be combined with clinical observations, patient history, and epidemiological information. The expected result is Negative. Fact Sheet for Patients: HairSlick.nohttps://www.fda.gov/media/138098/download Fact Sheet for Healthcare Providers: quierodirigir.comhttps://www.fda.gov/media/138095/download This test is not yet approved or cleared by the [place] FDA and  has been authorized for detection and/or diagnosis of SARS-CoV-2 by FDA under an Emergency Use Authorization  (EUA). This EUA will remain  in effect (meaning this test can be used) for the duration of the COVID-19 declaration under Section 56 4(b)(1) of the Act, 21 U.S.C. section 360bbb-3(b)(1), unless the authorization is terminated or revoked sooner. Performed at [facility], [address] [place], [place] [address]   SARS CORONAVIRUS 2 (TAT 6-24 HRS) Nasopharyngeal Nasopharyngeal Swab     Status: None   Collection Time: 03/18/19 10:13 AM   Specimen: Nasopharyngeal Swab  Result Value Ref Range Status   SARS Coronavirus 2 NEGATIVE NEGATIVE Final    Comment: (NOTE) SARS-CoV-2 target nucleic acids are NOT DETECTED. The SARS-CoV-2 RNA is generally detectable in upper and lower respiratory specimens during the acute phase of infection. Negative results do not preclude SARS-CoV-2 infection, do not rule out co-infections with other pathogens, and should not be used as the sole basis for treatment or other patient management decisions. Negative results must be combined with clinical observations, patient history, and epidemiological information. The expected result is Negative. Fact Sheet for Patients: HairSlick.nohttps://www.fda.gov/media/138098/download Fact Sheet for Healthcare Providers: quierodirigir.comhttps://www.fda.gov/media/138095/download This test is not yet approved or cleared by the [place] FDA and  has been authorized for detection and/or diagnosis of SARS-CoV-2 by FDA under an Emergency Use Authorization (EUA). This EUA will remain  in effect (meaning  this test can be used) for the duration of the COVID-19 declaration under Section 56 4(b)(1) of the Act, 21 U.S.C. section 360bbb-3(b)(1), unless the authorization is terminated or revoked sooner. Performed at [facility], [place] [address], [place], [place] [address]        Radiology Studies: No results found.      Scheduled Meds: .  stroke: mapping our early stages of recovery book   Does not apply Once  . aspirin EC  81 mg  Oral Daily  . atorvastatin  40 mg Oral q1800  . clopidogrel  75 mg Oral Daily  . enoxaparin (LOVENOX) injection  40 mg Subcutaneous Q24H  . insulin aspart  0-15 Units Subcutaneous TID WC  . insulin aspart  0-5 Units Subcutaneous QHS  . irbesartan  300 mg Oral Daily  . levETIRAcetam  500 mg Oral BID  . sertraline  50 mg Oral QHS   Continuous Infusions:   LOS: 3 days    Time spent: Marceline, DO [facility] PAGER is on [place]  If 7PM-7AM, please contact night-coverage [email or web address] Password [email or web address] 03/18/2019, 9:01 PM

## 2019-03-19 DIAGNOSIS — E785 Hyperlipidemia, unspecified: Secondary | ICD-10-CM

## 2019-03-19 LAB — GLUCOSE, CAPILLARY
Glucose-Capillary: 132 mg/dL — ABNORMAL HIGH (ref 70–99)
Glucose-Capillary: 175 mg/dL — ABNORMAL HIGH (ref 70–99)
Glucose-Capillary: 111 mg/dL — ABNORMAL HIGH (ref 70–99)
Glucose-Capillary: 170 mg/dL — ABNORMAL HIGH (ref 70–99)

## 2019-03-19 MED ORDER — CARVEDILOL 6.25 MG PO TABS
6.2500 mg | ORAL_TABLET | Freq: Two times a day (BID) | ORAL | Status: DC
  Administered 2019-03-19 – 2019-03-22 (×6): 6.25 mg via ORAL
  Filled 2019-03-19 (×6): qty 1

## 2019-03-19 MED ORDER — LORATADINE 10 MG PO TABS
10.0000 mg | ORAL_TABLET | Freq: Every day | ORAL | Status: DC
  Administered 2019-03-20 – 2019-03-24 (×5): 10 mg via ORAL
  Filled 2019-03-19 (×5): qty 1

## 2019-03-19 MED ORDER — TETRAHYDROZOLINE HCL 0.05 % OP SOLN
1.0000 [drp] | Freq: Every day | OPHTHALMIC | Status: DC
  Administered 2019-03-19 – 2019-03-24 (×6): 1 [drp] via OPHTHALMIC
  Filled 2019-03-19: qty 15

## 2019-03-19 MED ORDER — PANTOPRAZOLE SODIUM 40 MG PO TBEC
40.0000 mg | DELAYED_RELEASE_TABLET | Freq: Every day | ORAL | Status: DC
  Administered 2019-03-20 – 2019-03-24 (×5): 40 mg via ORAL
  Filled 2019-03-19 (×5): qty 1

## 2019-03-19 NOTE — Progress Notes (Signed)
PROGRESS NOTE    Rebecca Gallagher  ACZ:[id number] DOB: 06-06-1946 DOA: 03/15/2019 PCP: No primary care provider on file.    Brief Narrative:   femalewith medical history significant ofdiabetes, hypertension, depression is a transfer from [facility] for evaluation of TIA/stroke. Her initial work-up at [facility] was concerning for possible hypertension emergency as patient was hypertensive upon arrival and her blood pressure was in 200s However EDP reported change in neuro status-neurology on call was consulted who recommended state CTA and perfusion-and patient was transferred to [facility] for further evaluation and management.  Upon my evaluation: Patient reports right-sided weakness which started suddenly this morning. She denies fall, head trauma, loss of consciousness, headache, blurry vision, dizziness, lightheadedness, fall, chest pain, shortness of breath, palpitation, nausea, vomiting, fever, chills, diarrhea, constipation, epigastric pain, hematemesis or melena.  She lives with her daughter and she is former smoking, drinks alcohol 2-3 times per week, denies illicit drug use. She does not use walker or cane at home. She is compliant with her home medication.  ED Course:Patient's blood pressure found to be elevated. Evaluated by neurology who recommended full stroke work-up along with EEG.   Interim History: The patient is admitted to a telemetry bed to undergo a stroke work up. MR of the brain was obtained that demonstrated acute infarct of the left posterior MCA territory without hemorrhage. It also demonstrated chronic microvascular ischemic change in the white matter. CTA of the head and neck has been obtained that demonstrated subacute infarct of the left occipital parietal lobe, Atherosclerotic calcification of the carotid bifurcation bilaterally without significant stenosis. There is mild stenosis of the right vertebral artery. The left vertebral artery is  widely patent. 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 There is mild stenosis in the left MCA bifurcation. There is moderate stenosis of the posterior cerebral artery bilaterally. CT perfusion was negatiuve for acute infarct or ischemia.  Echocardiogram demonstrated EF of 60-65% with diastolic dysfunction and LVH. No intracardiac source of embolus is identified. There is moderate aortic stenosis.  Dr. Johney Frame of card placed implanted loop recorder.  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 Neurology has started the patient on Keppra and placed him on seizure precautions.   Assessment & Plan:   Principal Problem:   Ischemic stroke [facility]) Active Problems:   Hypertension   Hyperlipidemia   Diabetes mellitus (HCC)   Hypokalemia   Dehydration   Elevated troponin   Seizure disorder (HCC)   Acute infarction of left posterior MCA: Pt is being monitored on telemetry. Neurology has been consulted. Stroke work up is in place. MR of the brain was obtained that demonstrated acute infarct of the left posterior MCA territory without hemorrhage. It also demonstrated chronic microvascular ischemic change in the white matter. CTA of the head and neck has been obtained that demonstrated subacute infarct of the left occipital parietal lobe, Atherosclerotic calcification of the carotid bifurcation bilaterally without significant stenosis. There is mild stenosis of the right vertebral artery. The left vertebral artery is widely patent. 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 There is mild stenosis in the left MCA bifurcation. There is moderate stenosis of the posterior cerebral artery bilaterally. CT perfusion was negatiuve for acute infarct or ischemia. EEG has been performed and suggested left temporal epileptogenicity. The patient has been placed on keppra 500 mg bid and seizure precautions.  Echocardiogram demonstrated EF of 60-65% with  diastolic dysfunction and LVH. No intracardiac source  of embolus is identified. There is moderate aortic stenosis. SLP has been consulted for swallow evaluation --> recommended inpatient Rehab. Pt/family electing for SNF instead of CIR. -on plavix and ASA, lipitor -PT recommended SNF(pt/family electing for SNF instead of CIR) -per CSW, SNF bed is likely available on Monday  Arrhythmia: Neurology has expressed the concern that atrial fibrillation could be behind the patient's stroke. Cardiac electrophysiology has been consulted and will take the patient for TEE. Implantation of cardiac loop recorder was placed by Dr. Marlou Porch on 03/17/19.  Seizure Disorder: EEG demonstrated epilleptogenicity. Neurology has started the patient on [employer] and placed him on seizure precautions.  HTN: bp 169/48 -Irbesartan, Coreg -IV labetalol e as needed  Hyperlipidemia -lipiotor  Dabetes mellitus: Hemoglobin is 7.5 03/15/19. Kerrie Buffalo is being held.  -SSI  Elevated troponin: Likely secondary to uncontrolled hypertension. No chest pain. Troponins flat as trended over time. EKG demonstrates left bundle branch block. Monitor telemetry.  Depression: Continue Zoloft.  DVT prophylaxis: sq Lovenox Code Status: fall Family Communication: daughter Disposition Plan:  Likely d/c to SNF on Monday, waiting for SNF bed.   Consultants:   Card   neurology  Procedures:   EEG -  left parietal epilleptogenicity  TEE - pending  LOOP RECORDER INSERTION 10/8  Antimicrobials:  Subjective:  Patient reports no nausea vomiting, diarrhea or abdominal pain.  No fever or chills.  Denies chest pain.  Objective: Vitals:   03/19/19 0322 03/19/19 0817 03/19/19 1226 03/19/19 1622  BP:  (!) 169/65 (!) 140/48 (!) 155/76  Pulse: 60 70 62 (!) 57  Resp:  20  14  Temp:  97.9 F (36.6 C) 98 F (36.7 C) 98.1 F (36.7 C)  TempSrc:  Oral Oral   SpO2: 97% 94% 95% 95%  Weight:      Height:        Intake/Output  Summary (Last 24 hours) at 03/19/2019 1758 Last data filed at 03/19/2019 1300 Gross per 24 hour  Intake 582 ml  Output 1725 ml  Net -1143 ml   Filed Weights   03/15/19 1041 03/15/19 1059  Weight: 51.7 kg 51.7 kg    Examination: Physical Exam:  General: Not in acute distress HEENT: PERRL, EOMI, no scleral icterus, No JVD or bruit Cardiac: S1/S2, RRR, No murmurs, gallops or rubs Pulm: Good air movement bilaterally. Clear to auscultation bilaterally. No rales, wheezing, rhonchi or rubs. Abd: Soft, nondistended, nontender, no rebound pain, no organomegaly, BS present Ext: No edema. 2+DP/PT pulse bilaterally Musculoskeletal: No joint deformities, erythema, or stiffness, ROM full Skin: No rashes.  Neuro: Alert and oriented X3, cranial nerves II-XII grossly intact, muscle strength 5/5 in all extremeties, sensation to light touch intact. Brachial reflex 2+ bilaterally. Knee reflex 1+ bilaterally. Negative Babinski's sign. Normal finger to nose test. Psych: Patient is not psychotic, no suicidal or hemocidal ideation.    Data Reviewed: I have personally reviewed following labs and imaging studies  CBC: Recent Labs  Lab 03/15/19 1158 03/15/19 1722  WBC 8.0 7.9  NEUTROABS 5.5  --   HGB 14.1 13.4  HCT 42.4 38.7  MCV 84.1 83.6  PLT 264 478   Basic Metabolic Panel: Recent Labs  Lab 03/15/19 1158 03/15/19 1704  NA 138  --   K 3.4*  --   CL 105  --   CO2 24  --   GLUCOSE 156*  --   BUN 16  --   CREATININE 0.88 0.94  CALCIUM 9.8  --    GFR: Estimated Creatinine  Clearance: 35.8 mL/min (by C-G formula based on SCr of 0.94 mg/dL). Liver Function Tests: No results for input(s): AST, ALT, ALKPHOS, BILITOT, PROT, ALBUMIN in the last 168 hours. No results for input(s): LIPASE, AMYLASE in the last 168 hours. No results for input(s): AMMONIA in the last 168 hours. Coagulation Profile: No results for input(s): INR, PROTIME in the last 168 hours. Cardiac Enzymes: No results for  input(s): CKTOTAL, CKMB, CKMBINDEX, TROPONINI in the last 168 hours. BNP (last 3 results) No results for input(s): PROBNP in the last 8760 hours. HbA1C: No results for input(s): HGBA1C in the last 72 hours. CBG: Recent Labs  Lab 03/18/19 1834 03/18/19 2125 03/19/19 0640 03/19/19 1225 03/19/19 1621  GLUCAP 171* 94 132* 175* 111*   Lipid Profile: No results for input(s): CHOL, HDL, LDLCALC, TRIG, CHOLHDL, LDLDIRECT in the last 72 hours. Thyroid Function Tests: No results for input(s): TSH, T4TOTAL, FREET4, T3FREE, THYROIDAB in the last 72 hours. Anemia Panel: No results for input(s): VITAMINB12, FOLATE, FERRITIN, TIBC, IRON, RETICCTPCT in the last 72 hours. Sepsis Labs: No results for input(s): PROCALCITON, LATICACIDVEN in the last 168 hours.  Recent Results (from the past 240 hour(s))  SARS CORONAVIRUS 2 (TAT 6-24 HRS) Nasopharyngeal Nasopharyngeal Swab     Status: None   Collection Time: 03/15/19  1:46 PM   Specimen: Nasopharyngeal Swab  Result Value Ref Range Status   SARS Coronavirus 2 NEGATIVE NEGATIVE Final    Comment: (NOTE) SARS-CoV-2 target nucleic acids are NOT DETECTED. The SARS-CoV-2 RNA is generally detectable in upper and lower respiratory specimens during the acute phase of infection. Negative results do not preclude SARS-CoV-2 infection, do not rule out co-infections with other pathogens, and should not be used as the sole basis for treatment or other patient management decisions. Negative results must be combined with clinical observations, patient history, and epidemiological information. The expected result is Negative. Fact Sheet for Patients: HairSlick.no Fact Sheet for Healthcare Providers: quierodirigir.com This test is not yet approved or cleared by the [place] FDA and  has been authorized for detection and/or diagnosis of SARS-CoV-2 by FDA under an Emergency Use Authorization (EUA). This  EUA will remain  in effect (meaning this test can be used) for the duration of the COVID-19 declaration under Section 56 4(b)(1) of the Act, 21 U.S.C. section 360bbb-3(b)(1), unless the authorization is terminated or revoked sooner. Performed at [facility], [address], [place], [place] [address]   SARS CORONAVIRUS 2 (TAT 6-24 HRS) Nasopharyngeal Nasopharyngeal Swab     Status: None   Collection Time: 03/18/19 10:13 AM   Specimen: Nasopharyngeal Swab  Result Value Ref Range Status   SARS Coronavirus 2 NEGATIVE NEGATIVE Final    Comment: (NOTE) SARS-CoV-2 target nucleic acids are NOT DETECTED. The SARS-CoV-2 RNA is generally detectable in upper and lower respiratory specimens during the acute phase of infection. Negative results do not preclude SARS-CoV-2 infection, do not rule out co-infections with other pathogens, and should not be used as the sole basis for treatment or other patient management decisions. Negative results must be combined with clinical observations, patient history, and epidemiological information. The expected result is Negative. Fact Sheet for Patients: HairSlick.no Fact Sheet for Healthcare Providers: quierodirigir.com This test is not yet approved or cleared by the [place] FDA and  has been authorized for detection and/or diagnosis of SARS-CoV-2 by FDA under an Emergency Use Authorization (EUA). This EUA will remain  in effect (meaning this test can be used) for the duration  of the COVID-19 declaration under Section 56 4(b)(1) of the Act, 21 U.S.C. section 360bbb-3(b)(1), unless the authorization is terminated or revoked sooner. Performed at [facility], [address] [place], [place] [address]        Radiology Studies: No results found.      Scheduled Meds:   stroke: mapping our early stages of recovery book   Does not apply Once   aspirin EC  81 mg Oral Daily     atorvastatin  40 mg Oral q1800   carvedilol  6.25 mg Oral BID   clopidogrel  75 mg Oral Daily   enoxaparin (LOVENOX) injection  40 mg Subcutaneous Q24H   insulin aspart  0-15 Units Subcutaneous TID WC   insulin aspart  0-5 Units Subcutaneous QHS   irbesartan  300 mg Oral Daily   levETIRAcetam  500 mg Oral BID   loratadine  10 mg Oral Daily   pantoprazole  40 mg Oral Daily   sertraline  50 mg Oral QHS   tetrahydrozoline  1 drop Both Eyes Daily   Continuous Infusions:   LOS: 4 days    Time spent: 2729    Lorretta HarpXilin Charley Miske, DO [facility] PAGER is on AMION  If 7PM-7AM, please contact night-coverage [email or web address] Password [email or web address] 03/19/2019, 5:58 PM

## 2019-03-19 NOTE — Plan of Care (Signed)
Progressing towards goals

## 2019-03-20 LAB — GLUCOSE, CAPILLARY
Glucose-Capillary: 159 mg/dL — ABNORMAL HIGH (ref 70–99)
Glucose-Capillary: 154 mg/dL — ABNORMAL HIGH (ref 70–99)
Glucose-Capillary: 113 mg/dL — ABNORMAL HIGH (ref 70–99)
Glucose-Capillary: 133 mg/dL — ABNORMAL HIGH (ref 70–99)

## 2019-03-20 NOTE — Progress Notes (Signed)
[facility] PROGRESS NOTE  Zadie CleverlyBobbie Thilges WUJ:[id number] DOB: 12/25/1945 DOA: 03/15/2019 PCP: No primary care provider on file.  Brief summary   73 y/o femalewith medical history significant ofdiabetes, hypertension, depression is a transfer from [facility] for evaluation of TIA/stroke. Her initial [facility] was concerning for possible hypertension emergency as patient was hypertensive upon arrival and her blood pressure was in 200s However EDP reported change in neuro status-neurology on call was consulted who recommended state CTA and perfusion-and patient was [facility] for further evaluation and management.  Upon my evaluation: Patient reports right-sided weakness which started suddenly this morning. She denies fall, head trauma, loss of consciousness, headache, blurry vision, dizziness, lightheadedness, fall, chest pain, shortness of breath, palpitation, nausea, vomiting, fever, chills, diarrhea, constipation, epigastric pain, hematemesis or melena.  She lives with her daughter and she is former smoking, drinks alcohol 2-3 times per week, denies illicit drug use. She does not use walker or cane at home. She is compliant with her home medication.  ED Course:Patient's blood pressure found to be elevated. Evaluated by neurology who recommended full stroke work-up along with EEG.   Interim History: The patient is admitted to a telemetry bed to undergo a stroke work up. MR of the brain was obtained that demonstrated acute infarct of the left posterior MCA territory without hemorrhage. It also demonstrated chronic microvascular ischemic change in the white matter. CTA of the head and neck has been obtained that demonstrated subacute infarct of the left occipital parietal lobe, Atherosclerotic calcification of the carotid bifurcation bilaterally without significant stenosis. There is mild stenosis of the right vertebral artery. The left  vertebral artery is widely patent. 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 There is mild stenosis in the left MCA bifurcation. There is moderate stenosis of the posterior cerebral artery bilaterally. CT perfusion was negatiuve for acute infarct or ischemia.  Echocardiogram demonstrated EF of 60-65% with diastolic dysfunction and LVH. No intracardiac source of embolus is identified. There is moderate aortic stenosis.  Dr. Johney FrameAllred of card placed implanted loop recorder.  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 Neurology has started the patient on Keppra and placed him on seizure precautions.  Assessment/Plan:  Acute infarction of left posterior MCA: Pt is being monitored on telemetry. Neurology has been consulted. Stroke work up is in place. MR of the brain was obtained that demonstrated acute infarct of the left posterior MCA territory without hemorrhage. It also demonstrated chronic microvascular ischemic change in the white matter. CTA of the head and neck has been obtained that demonstrated subacute infarct of the left occipital parietal lobe, Atherosclerotic calcification of the carotid bifurcation bilaterally without significant stenosis. There is mild stenosis of the right vertebral artery. The left vertebral artery is widely patent. 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 There is mild stenosis in the left MCA bifurcation. There is moderate stenosis of the posterior cerebral artery bilaterally. CT perfusion was negatiuve for acute infarct or ischemia.EEG has been performed and suggested left temporal epileptogenicity. The patient has been placed on keppra 500 mg bid and seizure precautions.  Echocardiogram demonstrated EF of 60-65% with diastolic dysfunction and LVH. No intracardiac source of embolus is identified. There is moderate aortic stenosis. SLP has been consulted for swallow evaluation --> recommended inpatient  Rehab. Pt/family electing for SNF instead of CIR. -on plavix and ASA, lipitor -PT recommended SNF(pt/family electing for SNF instead of CIR) -per CSW, SNF bed  is likely available on Monday  Arrhythmia: Neurology has expressed the concern that atrial fibrillation could be behind the patient's stroke. Cardiac electrophysiology has been consulted and will take the patient for TEE. Implantation of cardiac loop recorder was placed by Dr. Hillery Jacks on 03/17/19.  Seizure Disorder: EEG demonstrated epilleptogenicity. Neurology has started the patient on Keppra and placed him on seizure precautions. D/c tramadol   HTN: bp 169/48 -Irbesartan, Coreg -IV labetalol e as needed  Hyperlipidemia -lipiotor  Dabetes mellitus: Hemoglobin is 7.5 03/15/19. Vikki Ports is being held.  -SSI  Elevated troponin: Likely secondary to uncontrolled hypertension. No chest pain. Troponins flat as trended over time. EKG demonstrates left bundle branch block. Monitor telemetry.  Depression: Continue Zoloft.  Code Status: full Family Communication: d.w patient, RN (indicate person spoken with, relationship, and if by phone, the number) Disposition Plan: awaiting SNF   Consultants:  Card   Neurology   Procedures:  EEG -left parietal epilleptogenicity  TEE - pending  LOOP RECORDER INSERTION 10/8  Antibiotics: Anti-infectives (From admission, onward)   None        (indicate start date, and stop date if known)  HPI/Subjective: Remains with mild hemiplegia, aphasia. No acute pains. No fevers   Objective: Vitals:   03/20/19 0417 03/20/19 0911  BP: 132/60 (!) 142/50  Pulse: (!) 52 (!) 58  Resp: 16 17  Temp: 97.7 F (36.5 C) 98.1 F (36.7 C)  SpO2: 99% 97%    Intake/Output Summary (Last 24 hours) at 03/20/2019 0947 Last data filed at 03/20/2019 0900 Gross per 24 hour  Intake 360 ml  Output 1300 ml  Net -940 ml   Filed Weights   03/15/19 1041 03/15/19 1059  Weight: 51.7 kg 51.7 kg     Exam:   General:  No distress   Cardiovascular: s1,s2 rrr  Respiratory: CTA BL  Abdomen: soft, nt   Musculoskeletal: no leg edema    Data Reviewed: Basic Metabolic Panel: Recent Labs  Lab 03/15/19 1158 03/15/19 1704  NA 138  --   K 3.4*  --   CL 105  --   CO2 24  --   GLUCOSE 156*  --   BUN 16  --   CREATININE 0.88 0.94  CALCIUM 9.8  --    Liver Function Tests: No results for input(s): AST, ALT, ALKPHOS, BILITOT, PROT, ALBUMIN in the last 168 hours. No results for input(s): LIPASE, AMYLASE in the last 168 hours. No results for input(s): AMMONIA in the last 168 hours. CBC: Recent Labs  Lab 03/15/19 1158 03/15/19 1722  WBC 8.0 7.9  NEUTROABS 5.5  --   HGB 14.1 13.4  HCT 42.4 38.7  MCV 84.1 83.6  PLT 264 255   Cardiac Enzymes: No results for input(s): CKTOTAL, CKMB, CKMBINDEX, TROPONINI in the last 168 hours. BNP (last 3 results) No results for input(s): BNP in the last 8760 hours.  ProBNP (last 3 results) No results for input(s): PROBNP in the last 8760 hours.  CBG: Recent Labs  Lab 03/19/19 0640 03/19/19 1225 03/19/19 1621 03/19/19 2126 03/20/19 0611  GLUCAP 132* 175* 111* 170* 159*    Recent Results (from the past 240 hour(s))  SARS CORONAVIRUS 2 (TAT 6-24 HRS) Nasopharyngeal Nasopharyngeal Swab     Status: None   Collection Time: 03/15/19  1:46 PM   Specimen: Nasopharyngeal Swab  Result Value Ref Range Status   SARS Coronavirus 2 NEGATIVE NEGATIVE Final    Comment: (NOTE) SARS-CoV-2 target nucleic acids are NOT DETECTED. The SARS-CoV-2  RNA is generally detectable in upper and lower respiratory specimens during the acute phase of infection. Negative results do not preclude SARS-CoV-2 infection, do not rule out co-infections with other pathogens, and should not be used as the sole basis for treatment or other patient management decisions. Negative results must be combined with clinical observations, patient history, and epidemiological  information. The expected result is Negative. Fact Sheet for Patients: SugarRoll.be Fact Sheet for Healthcare Providers: https://www.woods-mathews.com/ This test is not yet approved or cleared by the [place] FDA and  has been authorized for detection and/or diagnosis of SARS-CoV-2 by FDA under an Emergency Use Authorization (EUA). This EUA will remain  in effect (meaning this test can be used) for the duration of the COVID-19 declaration under Section 56 4(b)(1) of the Act, 21 U.S.C. section 360bbb-3(b)(1), unless the authorization is terminated or revoked sooner. Performed at [facility], [place] [address] [place], [place] [address]   SARS CORONAVIRUS 2 (TAT 6-24 HRS) Nasopharyngeal Nasopharyngeal Swab     Status: None   Collection Time: 03/18/19 10:13 AM   Specimen: Nasopharyngeal Swab  Result Value Ref Range Status   SARS Coronavirus 2 NEGATIVE NEGATIVE Final    Comment: (NOTE) SARS-CoV-2 target nucleic acids are NOT DETECTED. The SARS-CoV-2 RNA is generally detectable in upper and lower respiratory specimens during the acute phase of infection. Negative results do not preclude SARS-CoV-2 infection, do not rule out co-infections with other pathogens, and should not be used as the sole basis for treatment or other patient management decisions. Negative results must be combined with clinical observations, patient history, and epidemiological information. The expected result is Negative. Fact Sheet for Patients: SugarRoll.be Fact Sheet for Healthcare Providers: https://www.woods-mathews.com/ This test is not yet approved or cleared by the [place] FDA and  has been authorized for detection and/or diagnosis of SARS-CoV-2 by FDA under an Emergency Use Authorization (EUA). This EUA will remain  in effect (meaning this test can be used) for the duration of the COVID-19 declaration under  Section 56 4(b)(1) of the Act, 21 U.S.C. section 360bbb-3(b)(1), unless the authorization is terminated or revoked sooner. Performed at [facility], [place] [address], [place], [place] [address]      Studies: No results found.  Scheduled Meds: .  stroke: mapping our early stages of recovery book   Does not apply Once  . aspirin EC  81 mg Oral Daily  . atorvastatin  40 mg Oral q1800  . carvedilol  6.25 mg Oral BID  . clopidogrel  75 mg Oral Daily  . enoxaparin (LOVENOX) injection  40 mg Subcutaneous Q24H  . insulin aspart  0-15 Units Subcutaneous TID WC  . insulin aspart  0-5 Units Subcutaneous QHS  . irbesartan  300 mg Oral Daily  . levETIRAcetam  500 mg Oral BID  . loratadine  10 mg Oral Daily  . pantoprazole  40 mg Oral Daily  . sertraline  50 mg Oral QHS  . tetrahydrozoline  1 drop Both Eyes Daily   Continuous Infusions:  Principal Problem:   Ischemic stroke ([place]) Active Problems:   Hypertension   Hyperlipidemia   Diabetes mellitus ([place])   Hypokalemia   Dehydration   Elevated troponin   Seizure disorder ([place])    Time spent: >25 minutes     Kinnie Feil  [facility] Pager [phone]. If 7PM-7AM, please contact night-coverage at [email or web address], password [facility] 03/20/2019, 9:47 AM  LOS: 5 days

## 2019-03-20 NOTE — Plan of Care (Signed)
Progressing towards goals

## 2019-03-21 DIAGNOSIS — E1159 Type 2 diabetes mellitus with other circulatory complications: Secondary | ICD-10-CM

## 2019-03-21 LAB — GLUCOSE, CAPILLARY
Glucose-Capillary: 128 mg/dL — ABNORMAL HIGH (ref 70–99)
Glucose-Capillary: 161 mg/dL — ABNORMAL HIGH (ref 70–99)
Glucose-Capillary: 136 mg/dL — ABNORMAL HIGH (ref 70–99)
Glucose-Capillary: 110 mg/dL — ABNORMAL HIGH (ref 70–99)

## 2019-03-21 NOTE — Progress Notes (Signed)
Physical Therapy Treatment Patient Details Name: Rebecca Gallagher MRN: [id number] DOB: [birth year] Today's Date: 03/21/2019    History of Present Illness  73 y.o. female with medical history significant of diabetes, hypertension, depression is a transfer from med [employer] for evaluation of TIA/stroke. 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    PT Comments    Pt tolerated treatment well, demonstrating improved transfer quality and initiating gait training. Pt requiring PT knee block on R side due to insufficient quad strength and buckling. Pt continues to require physical support for all OOB activity.   Follow Up Recommendations  SNF     Equipment Recommendations  Rolling walker with 5" wheels;3in1 (PT)    Recommendations for Other Services       Precautions / Restrictions Precautions Precautions: Fall Restrictions Weight Bearing Restrictions: No    Mobility  Bed Mobility Overal bed mobility: Needs Assistance Bed Mobility: Supine to Sit     Supine to sit: Supervision;HOB elevated     General bed mobility comments: pt utilizing bed rails  Transfers Overall transfer level: Needs assistance Equipment used: None Transfers: Sit to/from [employer] Transfers Sit to Stand: Mod assist Stand pivot transfers: Mod assist          Ambulation/Gait Ambulation/Gait assistance: Mod assist Gait Distance (Feet): 10 Feet(2 trials) Assistive device: (railing) Gait Pattern/deviations: Step-to pattern;Decreased step length - right;Decreased stance time - right;Decreased dorsiflexion - right Gait velocity: reduced Gait velocity interpretation: <1.31 ft/sec, indicative of household ambulator General Gait Details: PT providing knee block on R side during stance phase as well as minA for advancement of RLE during swing phase   Stairs             Wheelchair Mobility    Modified Rankin (Stroke Patients Only) Modified Rankin (Stroke Patients Only) Pre-Morbid  Rankin Score: No symptoms Modified Rankin: Moderately severe disability     Balance Overall balance assessment: Needs assistance Sitting-balance support: Bilateral upper extremity supported;Feet supported Sitting balance-Leahy Scale: Fair Sitting balance - Comments: supervision for static sitting balance   Standing balance support: Single extremity supported Standing balance-Leahy Scale: Poor Standing balance comment: modA (R knee block)                            Cognition Arousal/Alertness: Awake/alert Behavior During Therapy: WFL for tasks assessed/performed Overall Cognitive Status: Impaired/Different from baseline Area of Impairment: Memory                     Memory: Decreased short-term memory                Exercises      General Comments        Pertinent Vitals/Pain Pain Assessment: No/denies pain    Home Living                      Prior Function            PT Goals (current goals can now be found in the care plan section) Acute Rehab PT Goals Patient Stated Goal: to be able to do things again  Progress towards PT goals: Progressing toward goals    Frequency    Min 3X/week      PT Plan Current plan remains appropriate    Co-evaluation              AM-PAC PT "6 Clicks" Mobility   Outcome Measure  Help  needed turning from your back to your side while in a flat bed without using bedrails?: None Help needed moving from lying on your back to sitting on the side of a flat bed without using bedrails?: None Help needed moving to and from a bed to a chair (including a wheelchair)?: A Lot Help needed standing up from a chair using your arms (e.g., wheelchair or bedside chair)?: A Lot Help needed to walk in hospital room?: A Lot Help needed climbing 3-5 steps with a railing? : Total 6 Click Score: 15    End of Session Equipment Utilized During Treatment: Gait belt Activity Tolerance: Patient tolerated  treatment well Patient left: in chair;with call bell/phone within reach;with family/visitor present Nurse Communication: Mobility status PT Visit Diagnosis: Other abnormalities of gait and mobility (R26.89)     Time: 8469-6295 PT Time Calculation (min) (ACUTE ONLY): 30 min  Charges:  $Gait Training: 8-22 mins $Therapeutic Activity: 8-22 mins                     Zenaida Niece, PT, DPT Acute Rehabilitation Pager: [phone]    Zenaida Niece 03/21/2019, 1:45 PM

## 2019-03-21 NOTE — TOC Progression Note (Signed)
Transition of Care [facility]) - Progression Note    Patient Details  Name: Rebecca Gallagher MRN: [id number] Date of Birth: 1945/07/13  [facility]) CM/SW Contact  Pollie Friar, RN Phone Number: 03/21/2019, 12:36 PM  Clinical Narrative:    Pt has received authorization for SNF rehab from [facility]. PASAR number is still pending. Pt unable to d/c to facility until PASAR number has been received. PASAR has gone to manual review. TOC following.   Expected Discharge Plan: [place] Barriers to Discharge: Continued Medical Work up, SNF Pending bed offer  Expected Discharge Plan and Services Expected Discharge Plan: [place] In-house Referral: Clinical Social Work Discharge Planning Services: CM Consult Post Acute Care Choice: [place] Living arrangements for the past 2 months: Apartment                                       Social Determinants of Health (SDOH) Interventions    Readmission Risk Interventions No flowsheet data found.

## 2019-03-21 NOTE — Progress Notes (Signed)
[facility] PROGRESS NOTE  Rosalena Mccorry TMH:[id number] DOB: 12-30-1945 DOA: 03/15/2019 PCP: No primary care provider on file.  Brief summary   73 y/o femalewith medical history significant ofdiabetes, hypertension, depression is a transfer from [facility] for evaluation of TIA/stroke. Her initial work-up at [facility] was concerning for possible hypertensive emergency as patient was hypertensive upon arrival and her blood pressure was in 200s However EDP reported change in neuro status-neurology on call was consulted who recommended stat CTA and perfusion-and patient was patient was diagnosed with subacute infarct in left occipital parietal cortex.  Patient was then transferred to [facility] for further evaluation and management.  Patient did complain of right-sided weakness to the admitting physician. She lives with her daughter and she is former smoker, drinks alcohol 2-3 times per week, denies illicit drug use. She does not use walker or cane at home.  According to her,she is compliant with her home medication. Subsequently MRI of the brain was obtained which showed acute infarct of left posterior MCA territory without hemorrhage.  Chronic microvascular ischemic changes in white matter, Atherosclerotic calcification of the carotid bifurcation bilaterally without significant stenosis. There is mild stenosis of the right vertebral artery. The left vertebral artery is widely patent. 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 There is mild stenosis in the left MCA bifurcation. There is moderate stenosis of the posterior cerebral artery bilaterally. Echocardiogram demonstrated EF of 60-65% with diastolic dysfunction and LVH. No intracardiac source of embolus is identified. There is moderate aortic stenosis.  Dr. Johney Frame of card placed implanted loop recorder.  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 Neurology has started the patient on Keppra  and placed him on seizure precautions.  Assessment/Plan:  Acute infarction of left posterior MCA: Neurology has been consulted.  He was last seen by them on 03/17/2019.  Stroke work up completed.  MR of the brain was obtained that demonstrated acute infarct of the left posterior MCA territory without hemorrhage. It also demonstrated chronic microvascular ischemic change in the white matter. CTA of the head and neck has been obtained that demonstrated subacute infarct of the left occipital parietal lobe, Atherosclerotic calcification of the carotid bifurcation bilaterally without significant stenosis. There is mild stenosis of the right vertebral artery. The left vertebral artery is widely patent. 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 There is mild stenosis in the left MCA bifurcation. There is moderate stenosis of the posterior cerebral artery bilaterally. CT perfusion was negatiuve for acute infarct or ischemia.EEG has been performed and suggested left temporal epileptogenicity. The patient has been placed on keppra 500 mg bid and seizure precautions.  Echocardiogram demonstrated EF of 60-65% with diastolic dysfunction and LVH. No intracardiac source of embolus is identified. There is moderate aortic stenosis. SLP has been consulted for swallow evaluation --> recommended inpatient Rehab. Pt/family electing for SNF instead of CIR. -on plavix and ASA, lipitor -per CSW, patient' PSAAR still pending so she will most likely be discharged tomorrow.  Arrhythmia: Neurology has expressed the concern that atrial fibrillation could be behind the patient's stroke. Cardiac electrophysiology was consulted and she underwent implantation of cardiac loop recorder was placed by Dr. Hillery Jacks on 03/17/19.  Seizure Disorder: EEG demonstrated epilleptogenicity.  She was started on Keppra by neurology.  HTN: Blood pressure stable. -Continue irbesartan, Coreg and PRN IV  labetalol.  Hyperlipidemia -lipiotor  Dabetes mellitus: Hemoglobin is 7.5 03/15/19. Vikki Ports is being held.  -SSI  Elevated troponin: Likely secondary  to uncontrolled hypertension. No chest pain. Troponins flat as trended over time. EKG demonstrates left bundle branch block. Monitor telemetry.  Depression: Continue Zoloft.  Code Status: full Family Communication: Discussed with patient.  All questions answered. Disposition Plan: awaiting SNF.  Potential discharge tomorrow.   Consultants:  Card   Neurology   Procedures:  EEG -left parietal epilleptogenicity  LOOP RECORDER INSERTION 10/8  Antibiotics: Anti-infectives (From admission, onward)   None       (indicate start date, and stop date if known)  HPI/Subjective: Patient seen and examined.  No new complaints.  Objective: Vitals:   03/21/19 0358 03/21/19 0808  BP: 127/67 (!) 128/59  Pulse: (!) 57 (!) 57  Resp: 16 17  Temp: (!) 97.5 F (36.4 C) (!) 97.4 F (36.3 C)  SpO2: 98% 97%    Intake/Output Summary (Last 24 hours) at 03/21/2019 1424 Last data filed at 03/21/2019 0900 Gross per 24 hour  Intake 220 ml  Output -  Net 220 ml   Filed Weights   03/15/19 1041 03/15/19 1059  Weight: 51.7 kg 51.7 kg    Exam:  General exam: Appears calm and comfortable  Respiratory system: Clear to auscultation. Respiratory effort normal. Cardiovascular system: S1 & S2 heard, RRR. No JVD, murmurs, rubs, gallops or clicks. No pedal edema. Gastrointestinal system: Abdomen is nondistended, soft and nontender. No organomegaly or masses felt. Normal bowel sounds heard. Central nervous system: Alert and oriented. No focal neurological deficits. Extremities: Symmetric 5 x 5 power. Skin: No rashes, lesions or ulcers.  Psychiatry: Judgement and insight appear normal. Mood & affect appropriate.    Data Reviewed: Basic Metabolic Panel: Recent Labs  Lab 03/15/19 1158 03/15/19 1704  NA 138  --   K 3.4*  --   CL  105  --   CO2 24  --   GLUCOSE 156*  --   BUN 16  --   CREATININE 0.88 0.94  CALCIUM 9.8  --    Liver Function Tests: No results for input(s): AST, ALT, ALKPHOS, BILITOT, PROT, ALBUMIN in the last 168 hours. No results for input(s): LIPASE, AMYLASE in the last 168 hours. No results for input(s): AMMONIA in the last 168 hours. CBC: Recent Labs  Lab 03/15/19 1158 03/15/19 1722  WBC 8.0 7.9  NEUTROABS 5.5  --   HGB 14.1 13.4  HCT 42.4 38.7  MCV 84.1 83.6  PLT 264 255   Cardiac Enzymes: No results for input(s): CKTOTAL, CKMB, CKMBINDEX, TROPONINI in the last 168 hours. BNP (last 3 results) No results for input(s): BNP in the last 8760 hours.  ProBNP (last 3 results) No results for input(s): PROBNP in the last 8760 hours.  CBG: Recent Labs  Lab 03/20/19 1200 03/20/19 1617 03/20/19 2124 03/21/19 0634 03/21/19 1151  GLUCAP 154* 113* 133* 128* 161*    Recent Results (from the past 240 hour(s))  SARS CORONAVIRUS 2 (TAT 6-24 HRS) Nasopharyngeal Nasopharyngeal Swab     Status: None   Collection Time: 03/15/19  1:46 PM   Specimen: Nasopharyngeal Swab  Result Value Ref Range Status   SARS Coronavirus 2 NEGATIVE NEGATIVE Final    Comment: (NOTE) SARS-CoV-2 target nucleic acids are NOT DETECTED. The SARS-CoV-2 RNA is generally detectable in upper and lower respiratory specimens during the acute phase of infection. Negative results do not preclude SARS-CoV-2 infection, do not rule out co-infections with other pathogens, and should not be used as the sole basis for treatment or other patient management decisions. Negative results must be  combined with clinical observations, patient history, and epidemiological information. The expected result is Negative. Fact Sheet for Patients: SugarRoll.be Fact Sheet for Healthcare Providers: https://www.woods-mathews.com/ This test is not yet approved or cleared by the [place] FDA and   has been authorized for detection and/or diagnosis of SARS-CoV-2 by FDA under an Emergency Use Authorization (EUA). This EUA will remain  in effect (meaning this test can be used) for the duration of the COVID-19 declaration under Section 56 4(b)(1) of the Act, 21 U.S.C. section 360bbb-3(b)(1), unless the authorization is terminated or revoked sooner. Performed at [facility], [place] [address], [place], [place] [address]   SARS CORONAVIRUS 2 (TAT 6-24 HRS) Nasopharyngeal Nasopharyngeal Swab     Status: None   Collection Time: 03/18/19 10:13 AM   Specimen: Nasopharyngeal Swab  Result Value Ref Range Status   SARS Coronavirus 2 NEGATIVE NEGATIVE Final    Comment: (NOTE) SARS-CoV-2 target nucleic acids are NOT DETECTED. The SARS-CoV-2 RNA is generally detectable in upper and lower respiratory specimens during the acute phase of infection. Negative results do not preclude SARS-CoV-2 infection, do not rule out co-infections with other pathogens, and should not be used as the sole basis for treatment or other patient management decisions. Negative results must be combined with clinical observations, patient history, and epidemiological information. The expected result is Negative. Fact Sheet for Patients: SugarRoll.be Fact Sheet for Healthcare Providers: https://www.woods-mathews.com/ This test is not yet approved or cleared by the [place] FDA and  has been authorized for detection and/or diagnosis of SARS-CoV-2 by FDA under an Emergency Use Authorization (EUA). This EUA will remain  in effect (meaning this test can be used) for the duration of the COVID-19 declaration under Section 56 4(b)(1) of the Act, 21 U.S.C. section 360bbb-3(b)(1), unless the authorization is terminated or revoked sooner. Performed at [facility], [place] [address] [place], [place] [address]      Studies: No results found.  Scheduled Meds: .   stroke: mapping our early stages of recovery book   Does not apply Once  . aspirin EC  81 mg Oral Daily  . atorvastatin  40 mg Oral q1800  . carvedilol  6.25 mg Oral BID  . clopidogrel  75 mg Oral Daily  . enoxaparin (LOVENOX) injection  40 mg Subcutaneous Q24H  . insulin aspart  0-15 Units Subcutaneous TID WC  . insulin aspart  0-5 Units Subcutaneous QHS  . irbesartan  300 mg Oral Daily  . levETIRAcetam  500 mg Oral BID  . loratadine  10 mg Oral Daily  . pantoprazole  40 mg Oral Daily  . sertraline  50 mg Oral QHS  . tetrahydrozoline  1 drop Both Eyes Daily   Continuous Infusions:  Principal Problem:   Ischemic stroke ([place]) Active Problems:   Hypertension   Hyperlipidemia   Diabetes mellitus ([place])   Hypokalemia   Dehydration   Elevated troponin   Seizure disorder ([place])  Time spent: 31 minutes  [place] [facility]  If 7PM-7AM, please contact night-coverage at [email or web address], password [facility] 03/21/2019, 2:24 PM  LOS: 6 days

## 2019-03-22 LAB — CBC WITH DIFFERENTIAL/PLATELET
Abs Immature Granulocytes: 0.01 10*3/uL (ref 0.00–0.07)
Basophils Absolute: 0.1 10*3/uL (ref 0.0–0.1)
Basophils Relative: 1 %
Eosinophils Absolute: 0.3 10*3/uL (ref 0.0–0.5)
Eosinophils Relative: 3 %
HCT: 37.5 % (ref 36.0–46.0)
Hemoglobin: 12.6 g/dL (ref 12.0–15.0)
Immature Granulocytes: 0 %
Lymphocytes Relative: 25 %
Lymphs Abs: 1.9 10*3/uL (ref 0.7–4.0)
MCH: 28.6 pg (ref 26.0–34.0)
MCHC: 33.6 g/dL (ref 30.0–36.0)
MCV: 85 fL (ref 80.0–100.0)
Monocytes Absolute: 1 10*3/uL (ref 0.1–1.0)
Monocytes Relative: 13 %
Neutro Abs: 4.5 10*3/uL (ref 1.7–7.7)
Neutrophils Relative %: 58 %
Platelets: 228 10*3/uL (ref 150–400)
RBC: 4.41 MIL/uL (ref 3.87–5.11)
RDW: 13.2 % (ref 11.5–15.5)
WBC: 7.7 10*3/uL (ref 4.0–10.5)
nRBC: 0 % (ref 0.0–0.2)

## 2019-03-22 LAB — GLUCOSE, CAPILLARY
Glucose-Capillary: 140 mg/dL — ABNORMAL HIGH (ref 70–99)
Glucose-Capillary: 136 mg/dL — ABNORMAL HIGH (ref 70–99)
Glucose-Capillary: 162 mg/dL — ABNORMAL HIGH (ref 70–99)

## 2019-03-22 LAB — BASIC METABOLIC PANEL
Anion gap: 11 (ref 5–15)
BUN: 32 mg/dL — ABNORMAL HIGH (ref 8–23)
CO2: 20 mmol/L — ABNORMAL LOW (ref 22–32)
Calcium: 9.7 mg/dL (ref 8.9–10.3)
Chloride: 107 mmol/L (ref 98–111)
Creatinine, Ser: 1.1 mg/dL — ABNORMAL HIGH (ref 0.44–1.00)
GFR calc Af Amer: 58 mL/min — ABNORMAL LOW (ref >60)
GFR calc non Af Amer: 50 mL/min — ABNORMAL LOW (ref >60)
Glucose, Bld: 142 mg/dL — ABNORMAL HIGH (ref 70–99)
Potassium: 3.8 mmol/L (ref 3.5–5.1)
Sodium: 138 mmol/L (ref 135–145)

## 2019-03-22 LAB — NOVEL CORONAVIRUS, NAA (HOSP ORDER, SEND-OUT TO REF LAB; TAT 18-24 HRS): SARS-CoV-2, NAA: NOT DETECTED

## 2019-03-22 MED ORDER — CARVEDILOL 3.125 MG PO TABS
3.1250 mg | ORAL_TABLET | Freq: Two times a day (BID) | ORAL | Status: DC
  Administered 2019-03-22 – 2019-03-23 (×3): 3.125 mg via ORAL
  Filled 2019-03-22 (×4): qty 1

## 2019-03-22 NOTE — TOC Progression Note (Signed)
Transition of Care [facility]) - Progression Note    Patient Details  Name: Haizel Gatchell MRN: [id number] Date of Birth: Dec 24, 1945  [facility]) CM/SW Contact  Pollie Friar, RN Phone Number: 03/22/2019, 3:35 PM  Clinical Narrative:    Continue to wait on PASAR for d/c to [place] rehab.    Expected Discharge Plan: [place] Barriers to Discharge: Continued Medical Work up, SNF Pending bed offer  Expected Discharge Plan and Services Expected Discharge Plan: [place] In-house Referral: Clinical Social Work Discharge Planning Services: CM Consult Post Acute Care Choice: [place] Living arrangements for the past 2 months: Apartment                                       Social Determinants of Health (SDOH) Interventions    Readmission Risk Interventions No flowsheet data found.

## 2019-03-22 NOTE — Progress Notes (Signed)
[facility] PROGRESS NOTE  Evaline Waltman HMC:[id number] DOB: [birth year] DOA: 03/15/2019 PCP: No primary care provider on file.  Brief summary   73 y/o femalewith medical history significant ofdiabetes, hypertension, depression is a transfer from [employer] for evaluation of TIA/stroke. Her initial work-up at [facility] was concerning for possible hypertensive emergency as patient was hypertensive upon arrival and her blood pressure was in 200s However EDP reported change in neuro status-neurology on call was consulted who recommended stat CTA and perfusion-and patient was patient was diagnosed with subacute infarct in left occipital parietal cortex.  Patient was then transferred to [facility] for further evaluation and management.  Patient did complain of right-sided weakness to the admitting physician. She lives with her daughter and she is former smoker, drinks alcohol 2-3 times per week, denies illicit drug use. She does not use walker or cane at home.  According to her,she is compliant with her home medication. Subsequently MRI of the brain was obtained which showed acute infarct of left posterior MCA territory without hemorrhage.  Chronic microvascular ischemic changes in white matter, Atherosclerotic calcification of the carotid bifurcation bilaterally without significant stenosis. There is mild stenosis of the right vertebral artery. The left vertebral artery is widely patent. 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 There is mild stenosis in the left MCA bifurcation. There is moderate stenosis of the posterior cerebral artery bilaterally. Echocardiogram demonstrated EF of 60-65% with diastolic dysfunction and LVH. No intracardiac source of embolus is identified. There is moderate aortic stenosis.  Dr. Johney Frame of card placed implanted loop recorder.  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 Neurology has started the patient on Keppra  and placed him on seizure precautions.  Assessment/Plan:  Acute infarction of left posterior MCA: Neurology has been consulted.  He was last seen by them on 03/17/2019.  Stroke work up completed.  MR of the brain was obtained that demonstrated acute infarct of the left posterior MCA territory without hemorrhage. It also demonstrated chronic microvascular ischemic change in the white matter. CTA of the head and neck has been obtained that demonstrated subacute infarct of the left occipital parietal lobe, Atherosclerotic calcification of the carotid bifurcation bilaterally without significant stenosis. There is mild stenosis of the right vertebral artery. The left vertebral artery is widely patent. 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 There is mild stenosis in the left MCA bifurcation. There is moderate stenosis of the posterior cerebral artery bilaterally. CT perfusion was negatiuve for acute infarct or ischemia.EEG has been performed and suggested left temporal epileptogenicity. The patient has been placed on keppra 500 mg bid and seizure precautions.  Echocardiogram demonstrated EF of 60-65% with diastolic dysfunction and LVH. No intracardiac source of embolus is identified. There is moderate aortic stenosis. SLP has been consulted for swallow evaluation --> recommended inpatient Rehab. Pt/family electing for SNF instead of CIR. -on plavix and ASA, lipitor -per CSW, patient' PSAAR still pending so she will be discharged once that is approved.  Arrhythmia: Neurology has expressed the concern that atrial fibrillation could be behind the patient's stroke. Cardiac electrophysiology was consulted and she underwent implantation of cardiac loop recorder was placed by Dr. Hillery Jacks on 03/17/19.  Seizure Disorder: EEG demonstrated epilleptogenicity.  She was started on Keppra by neurology.  HTN: Blood pressure stable. -Continue irbesartan, Coreg and PRN IV  labetalol.  Hyperlipidemia -lipiotor  Dabetes mellitus: Hemoglobin is 7.5 03/15/19. Vikki Ports is being held.  Blood sugar control.  Continue  SSI.  Elevated troponin: Likely secondary to uncontrolled hypertension. No chest pain. Troponins flat as trended over time. EKG demonstrates left bundle branch block. Monitor telemetry.  Depression: Continue Zoloft.  Code Status: full Family Communication: Discussed with patient.  All questions answered. Disposition Plan: awaiting SNF.  Potential discharge today or tomorrow.   Consultants:  Card   Neurology   Procedures:  EEG -left parietal epilleptogenicity  LOOP RECORDER INSERTION 10/8  Antibiotics: Anti-infectives (From admission, onward)   None       (indicate start date, and stop date if known)  HPI/Subjective: Patient seen and examined.  Daughter at the bedside.  She has no complaints.  Daughter is frustrated with the time it is taking to place all the ducks in the row in order for her mother to be discharged to SNF.  Objective: Vitals:   03/22/19 0758 03/22/19 1144  BP: (!) 123/55 (!) 143/46  Pulse: (!) 55 (!) 53  Resp: 18 17  Temp: 98.2 F (36.8 C) 98 F (36.7 C)  SpO2: 97% 98%    Intake/Output Summary (Last 24 hours) at 03/22/2019 1512 Last data filed at 03/22/2019 0300 Gross per 24 hour  Intake -  Output 600 ml  Net -600 ml   Filed Weights   03/15/19 1041 03/15/19 1059  Weight: 51.7 kg 51.7 kg    Exam:  General exam: Appears calm and comfortable  Respiratory system: Clear to auscultation. Respiratory effort normal. Cardiovascular system: S1 & S2 heard, RRR. No JVD, murmurs, rubs, gallops or clicks. No pedal edema. Gastrointestinal system: Abdomen is nondistended, soft and nontender. No organomegaly or masses felt. Normal bowel sounds heard. Central nervous system: Alert and oriented. No focal neurological deficits. Extremities: Symmetric 5 x 5 power. Skin: No rashes, lesions or ulcers.   Psychiatry: Judgement and insight appear poor. Mood & affect appropriate.   Data Reviewed: Basic Metabolic Panel: Recent Labs  Lab 03/15/19 1704 03/22/19 0354  NA  --  138  K  --  3.8  CL  --  107  CO2  --  20*  GLUCOSE  --  142*  BUN  --  32*  CREATININE 0.94 1.10*  CALCIUM  --  9.7   Liver Function Tests: No results for input(s): AST, ALT, ALKPHOS, BILITOT, PROT, ALBUMIN in the last 168 hours. No results for input(s): LIPASE, AMYLASE in the last 168 hours. No results for input(s): AMMONIA in the last 168 hours. CBC: Recent Labs  Lab 03/15/19 1722 03/22/19 0354  WBC 7.9 7.7  NEUTROABS  --  4.5  HGB 13.4 12.6  HCT 38.7 37.5  MCV 83.6 85.0  PLT 255 228   Cardiac Enzymes: No results for input(s): CKTOTAL, CKMB, CKMBINDEX, TROPONINI in the last 168 hours. BNP (last 3 results) No results for input(s): BNP in the last 8760 hours.  ProBNP (last 3 results) No results for input(s): PROBNP in the last 8760 hours.  CBG: Recent Labs  Lab 03/21/19 0634 03/21/19 1151 03/21/19 1618 03/21/19 2119 03/22/19 0651  GLUCAP 128* 161* 136* 110* 140*    Recent Results (from the past 240 hour(s))  SARS CORONAVIRUS 2 (TAT 6-24 HRS) Nasopharyngeal Nasopharyngeal Swab     Status: None   Collection Time: 03/15/19  1:46 PM   Specimen: Nasopharyngeal Swab  Result Value Ref Range Status   SARS Coronavirus 2 NEGATIVE NEGATIVE Final    Comment: (NOTE) SARS-CoV-2 target nucleic acids are NOT DETECTED. The SARS-CoV-2 RNA is generally detectable in upper and lower respiratory specimens during  the acute phase of infection. Negative results do not preclude SARS-CoV-2 infection, do not rule out co-infections with other pathogens, and should not be used as the sole basis for treatment or other patient management decisions. Negative results must be combined with clinical observations, patient history, and epidemiological information. The expected result is Negative. Fact Sheet for  Patients: HairSlick.no Fact Sheet for Healthcare Providers: quierodirigir.com This test is not yet approved or cleared by the [place] FDA and  has been authorized for detection and/or diagnosis of SARS-CoV-2 by FDA under an Emergency Use Authorization (EUA). This EUA will remain  in effect (meaning this test can be used) for the duration of the COVID-19 declaration under Section 56 4(b)(1) of the Act, 21 U.S.C. section 360bbb-3(b)(1), unless the authorization is terminated or revoked sooner. Performed at [facility], [address] [place], [place] [address]   SARS CORONAVIRUS 2 (TAT 6-24 HRS) Nasopharyngeal Nasopharyngeal Swab     Status: None   Collection Time: 03/18/19 10:13 AM   Specimen: Nasopharyngeal Swab  Result Value Ref Range Status   SARS Coronavirus 2 NEGATIVE NEGATIVE Final    Comment: (NOTE) SARS-CoV-2 target nucleic acids are NOT DETECTED. The SARS-CoV-2 RNA is generally detectable in upper and lower respiratory specimens during the acute phase of infection. Negative results do not preclude SARS-CoV-2 infection, do not rule out co-infections with other pathogens, and should not be used as the sole basis for treatment or other patient management decisions. Negative results must be combined with clinical observations, patient history, and epidemiological information. The expected result is Negative. Fact Sheet for Patients: HairSlick.no Fact Sheet for Healthcare Providers: quierodirigir.com This test is not yet approved or cleared by the [place] FDA and  has been authorized for detection and/or diagnosis of SARS-CoV-2 by FDA under an Emergency Use Authorization (EUA). This EUA will remain  in effect (meaning this test can be used) for the duration of the COVID-19 declaration under Section 56 4(b)(1) of the Act, 21 U.S.C. section  360bbb-3(b)(1), unless the authorization is terminated or revoked sooner. Performed at [facility], [address] [place], [place] [address]   Novel Coronavirus, NAA (Hosp order, Send-out to Ref Lab; TAT 18-24 hrs     Status: None   Collection Time: 03/21/19  4:28 PM   Specimen: Nasopharyngeal Swab; Respiratory  Result Value Ref Range Status   SARS-CoV-2, NAA NOT DETECTED NOT DETECTED Final    Comment: (NOTE) This nucleic acid amplification test was developed and its performance characteristics determined by [employer]. Nucleic acid amplification tests include PCR and TMA. This test has not been FDA cleared or approved. This test has been authorized by FDA under an Emergency Use Authorization (EUA). This test is only authorized for the duration of time the declaration that circumstances exist justifying the authorization of the emergency use of in vitro diagnostic tests for detection of SARS-CoV-2 virus and/or diagnosis of COVID-19 infection under section 564(b)(1) of the Act, 21 U.S.C. 829FAO-1(H) (1), unless the authorization is terminated or revoked sooner. When diagnostic testing is negative, the possibility of a false negative result should be considered in the context of a patient's recent exposures and the presence of clinical signs and symptoms consistent with COVID-19. An individual without symptoms of COVID- 19 and who is not shedding SARS-CoV-2 vi rus would expect to have a negative (not detected) result in this assay. Performed At: [facility] [address] [place], [place] [id number] Jolene Schimke MD GE:[id number]  Coronavirus Source NASOPHARYNGEAL  Final    Comment: Performed at [facility], [address], [place],  [address]     Studies: No results found.  Scheduled Meds: .  stroke: mapping our early stages of recovery book   Does not apply Once  . aspirin EC  81 mg Oral Daily  . atorvastatin  40 mg Oral q1800   . carvedilol  3.125 mg Oral BID  . clopidogrel  75 mg Oral Daily  . insulin aspart  0-15 Units Subcutaneous TID WC  . insulin aspart  0-5 Units Subcutaneous QHS  . irbesartan  300 mg Oral Daily  . levETIRAcetam  500 mg Oral BID  . loratadine  10 mg Oral Daily  . pantoprazole  40 mg Oral Daily  . sertraline  50 mg Oral QHS  . tetrahydrozoline  1 drop Both Eyes Daily   Continuous Infusions:  Principal Problem:   Ischemic stroke ([place]) Active Problems:   Hypertension   Hyperlipidemia   Diabetes mellitus ([place])   Hypokalemia   Dehydration   Elevated troponin   Seizure disorder ([place])  Time spent: 27 minutes  [facility]  If 7PM-7AM, please contact night-coverage at [email or web address], password [facility] 03/22/2019, 3:12 PM  LOS: 7 days

## 2019-03-23 LAB — GLUCOSE, CAPILLARY
Glucose-Capillary: 154 mg/dL — ABNORMAL HIGH (ref 70–99)
Glucose-Capillary: 135 mg/dL — ABNORMAL HIGH (ref 70–99)
Glucose-Capillary: 144 mg/dL — ABNORMAL HIGH (ref 70–99)
Glucose-Capillary: 124 mg/dL — ABNORMAL HIGH (ref 70–99)
Glucose-Capillary: 117 mg/dL — ABNORMAL HIGH (ref 70–99)
Glucose-Capillary: 117 mg/dL — ABNORMAL HIGH (ref 70–99)

## 2019-03-23 LAB — BASIC METABOLIC PANEL
Anion gap: 8 (ref 5–15)
BUN: 26 mg/dL — ABNORMAL HIGH (ref 8–23)
CO2: 22 mmol/L (ref 22–32)
Calcium: 9.5 mg/dL (ref 8.9–10.3)
Chloride: 111 mmol/L (ref 98–111)
Creatinine, Ser: 0.97 mg/dL (ref 0.44–1.00)
GFR calc Af Amer: >60 mL/min (ref >60)
GFR calc non Af Amer: 58 mL/min — ABNORMAL LOW (ref >60)
Glucose, Bld: 118 mg/dL — ABNORMAL HIGH (ref 70–99)
Potassium: 3.8 mmol/L (ref 3.5–5.1)
Sodium: 141 mmol/L (ref 135–145)

## 2019-03-23 NOTE — Progress Notes (Signed)
Occupational Therapy Treatment Patient Details Name: Rebecca Gallagher MRN: [id number] DOB: 02/23/1946 Today's Date: 03/23/2019    History of present illness  73 y.o. female with medical history significant of diabetes, hypertension, depression is a transfer from med [employer] for evaluation of TIA/stroke. 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   OT comments  Pt progressing well with standing atr sink for ADL with minA overall. Pt requires reminders for proper sequencing for ambulation with RW and RLE weakness. Pt progressing with strength on R side. Pt continues to take increased processing time for tasks. OT following pt acutely.    Follow Up Recommendations  SNF    Equipment Recommendations  None recommended by OT    Recommendations for Other Services      Precautions / Restrictions Precautions Precautions: Fall Restrictions Weight Bearing Restrictions: No       Mobility Bed Mobility Overal bed mobility: Needs Assistance Bed Mobility: Supine to Sit     Supine to sit: Supervision;HOB elevated        Transfers Overall transfer level: Needs assistance Equipment used: Hemi-walker Transfers: Sit to/from [employer] Transfers Sit to Stand: Min guard Stand pivot transfers: Min assist            Balance Overall balance assessment: Needs assistance Sitting-balance support: Single extremity supported;Feet supported       Standing balance support: Single extremity supported Standing balance-Leahy Scale: Poor Standing balance comment: cues to stand upright                           ADL either performed or assessed with clinical judgement   ADL Overall ADL's : Needs assistance/impaired Eating/Feeding: Set up;Sitting   Grooming: Wash/dry hands;Wash/dry face;Oral care;Min guard;Standing                               Functional mobility during ADLs: Moderate assistance;Rolling walker;Cueing for safety;Cueing for  sequencing General ADL Comments: Assist required for pt to attend to task; pt limited by slow processing.     Vision   Vision Assessment?: Yes Eye Alignment: Within Functional Limits   Perception     Praxis      Cognition Arousal/Alertness: Awake/alert Behavior During Therapy: WFL for tasks assessed/performed Overall Cognitive Status: Impaired/Different from baseline Area of Impairment: Memory                   Current Attention Level: Selective Memory: Decreased short-term memory         General Comments: Pt is slow to follow commands, and cognition is difficult to accurately assess due to language deficits         Exercises     Shoulder Instructions       General Comments      Pertinent Vitals/ Pain       Pain Assessment: No/denies pain  Home Living                                          Prior Functioning/Environment              Frequency           Progress Toward Goals  OT Goals(current goals can now be found in the care plan section)  Progress towards OT goals: Progressing toward goals  Acute  Rehab OT Goals Patient Stated Goal: to regain function OT Goal Formulation: With patient Time For Goal Achievement: 04/06/19 Potential to Achieve Goals: Good  Plan Discharge plan remains appropriate    Co-evaluation                 AM-PAC OT "6 Clicks" Daily Activity     Outcome Measure   Help from another person eating meals?: A Little Help from another person taking care of personal grooming?: A Little Help from another person toileting, which includes using toliet, bedpan, or urinal?: A Lot Help from another person bathing (including washing, rinsing, drying)?: A Lot Help from another person to put on and taking off regular upper body clothing?: A Little Help from another person to put on and taking off regular lower body clothing?: A Lot 6 Click Score: 15    End of Session Equipment Utilized During  Treatment: Gait belt;Rolling walker  OT Visit Diagnosis: Unsteadiness on feet (R26.81);Hemiplegia and hemiparesis Hemiplegia - Right/Left: Right   Activity Tolerance Patient tolerated treatment well   Patient Left in bed;with call bell/phone within reach;with bed alarm set   Nurse Communication Mobility status        Time: 4098-1191 OT Time Calculation (min): 20 min  Charges: OT General Charges $OT Visit: 1 Visit OT Treatments $Neuromuscular Re-education: 8-22 mins  Darryl Nestle) Marsa Aris OTR/L Acute Rehabilitation Services Pager: [phone] Office: [place] 03/23/2019, 5:56 PM

## 2019-03-23 NOTE — Progress Notes (Signed)
  Speech Language Pathology Treatment: Cognitive-Linquistic  Patient Details Name: Rebecca Gallagher MRN: [id number] DOB: 1946-03-30 Today's Date: 03/23/2019 Time: 0321-2248 SLP Time Calculation (min) (ACUTE ONLY): 35 min  Assessment / Plan / Recommendation Clinical Impression  Pt seen at bedside for skilled ST intervention targeting goals for improved cognitive linguistic function. Pt's niece was present. She verbalized significant frustration with PASAR holdup.  SLP administered Bedside Western Aphasia Battery (WAB) for diagnostic treatment. Scores as follows: Spontaneous speech (90%), fluency (90%), Yes/No (80%), following multi-step directions (100%), Word and Sentence repetition (90%), object naming (100%), Reading comprehension (50%), Written expression (10% - using left/nondominant hand).   Pt able to verbalize name, place, time, and situation. Her niece indicated pt was fully independent (including management of finances, medication, cooking, cleaning, driving) prior to admit. Recommend continued ST intervention with diagnostic treatment of higher levels of cognition given level of independence prior to admit.    HPI HPI: 73 y.o. female with medical history significant of diabetes, hypertension, depression is a transfer from [place] for evaluation of TIA/stroke. Pt found to have L posterior MCA infarction      SLP Plan  Continue with current plan of care       Recommendations  Continue speech therapy at next venue.                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 Quentin Ore, [facility], [place] Speech Language Pathologist Office: [phone] Pager: [phone]  Shonna Chock 03/23/2019, 12:57 PM

## 2019-03-23 NOTE — Progress Notes (Signed)
[facility] PROGRESS NOTE  Rebecca Gallagher WUJ:[id number] DOB: [birth year] DOA: 03/15/2019 PCP: No primary care provider on file.  Brief summary   73 y/o femalewith medical history significant ofdiabetes, hypertension, depression is a transfer from [facility] for evaluation of TIA/stroke. Her initial work-up at [facility] was concerning for possible hypertensive emergency as patient was hypertensive upon arrival and her blood pressure was in 200s However EDP reported change in neuro status-neurology on call was consulted who recommended stat CTA and perfusion-and patient was patient was diagnosed with subacute infarct in left occipital parietal cortex.  Patient was then transferred to [facility] for further evaluation and management.  Patient did complain of right-sided weakness to the admitting physician. She lives with her daughter and she is former smoker, drinks alcohol 2-3 times per week, denies illicit drug use. She does not use walker or cane at home.  According to her,she is compliant with her home medication. Subsequently MRI of the brain was obtained which showed acute infarct of left posterior MCA territory without hemorrhage.  Chronic microvascular ischemic changes in white matter, Atherosclerotic calcification of the carotid bifurcation bilaterally without significant stenosis. There is mild stenosis of the right vertebral artery. The left vertebral artery is widely patent. 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 There is mild stenosis in the left MCA bifurcation. There is moderate stenosis of the posterior cerebral artery bilaterally. Echocardiogram demonstrated EF of 60-65% with diastolic dysfunction and LVH. No intracardiac source of embolus is identified. There is moderate aortic stenosis.  Dr. Johney Frame of card placed implanted loop recorder.  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 Neurology has started the patient on Keppra  and placed him on seizure precautions.  Assessment/Plan:  Acute infarction of left posterior MCA: Neurology has been consulted.  He was last seen by them on 03/17/2019.  Stroke work up completed.  MR of the brain was obtained that demonstrated acute infarct of the left posterior MCA territory without hemorrhage. It also demonstrated chronic microvascular ischemic change in the white matter. CTA of the head and neck has been obtained that demonstrated subacute infarct of the left occipital parietal lobe, Atherosclerotic calcification of the carotid bifurcation bilaterally without significant stenosis. There is mild stenosis of the right vertebral artery. The left vertebral artery is widely patent. 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 There is mild stenosis in the left MCA bifurcation. There is moderate stenosis of the posterior cerebral artery bilaterally. CT perfusion was negatiuve for acute infarct or ischemia.EEG has been performed and suggested left temporal epileptogenicity. The patient has been placed on keppra 500 mg bid and seizure precautions.  Echocardiogram demonstrated EF of 60-65% with diastolic dysfunction and LVH. No intracardiac source of embolus is identified. There is moderate aortic stenosis. SLP has been consulted for swallow evaluation --> recommended inpatient Rehab. Pt/family electing for SNF instead of CIR. -on plavix and ASA, lipitor -per CSW, patient' PASAR still pending for last several days so she will be discharged once that is approved.  Arrhythmia: Neurology has expressed the concern that atrial fibrillation could be behind the patient's stroke. Cardiac electrophysiology was consulted and she underwent implantation of cardiac loop recorder was placed by Dr. Hillery Jacks on 03/17/19.  Seizure Disorder: EEG demonstrated epilleptogenicity.  She was started on Keppra by neurology.  HTN: Blood pressure stable. -Continue irbesartan, Coreg and  PRN IV labetalol.  Hyperlipidemia -lipiotor  Dabetes mellitus: Hemoglobin is 7.5 03/15/19. Vikki Ports is being held.  Blood  sugar control.  Continue SSI.  Elevated troponin: Likely secondary to uncontrolled hypertension. No chest pain. Troponins flat as trended over time. EKG demonstrates left bundle branch block. Monitor telemetry.  Depression: Continue Zoloft.  Code Status: full Family Communication: Discussed with patient.  All questions answered. Disposition Plan: awaiting SNF.  Potential discharge today or tomorrow.   Consultants:  Card   Neurology   Procedures:  EEG -left parietal epilleptogenicity  LOOP RECORDER INSERTION 10/8  Antibiotics: Anti-infectives (From admission, onward)   None       (indicate start date, and stop date if known)  HPI/Subjective: Patient seen and examined.  She was working with physical therapy at that time.  She was doing well and according to physical therapist, she is progressing as expected.  She had no complaints. No family member present. Objective: Vitals:   03/22/19 2306 03/23/19 0400  BP: (!) 137/49 (!) 152/48  Pulse: (!) 51 (!) 51  Resp: 16 16  Temp: 98 F (36.7 C) 97.6 F (36.4 C)  SpO2: 97% 100%    Intake/Output Summary (Last 24 hours) at 03/23/2019 0826 Last data filed at 03/23/2019 0644 Gross per 24 hour  Intake 450 ml  Output 1400 ml  Net -950 ml   Filed Weights   03/15/19 1041 03/15/19 1059  Weight: 51.7 kg 51.7 kg    Exam:  General exam: Appears calm and comfortable  Respiratory system: Clear to auscultation. Respiratory effort normal. Cardiovascular system: S1 & S2 heard, RRR. No JVD, murmurs, rubs, gallops or clicks. No pedal edema. Gastrointestinal system: Abdomen is nondistended, soft and nontender. No organomegaly or masses felt. Normal bowel sounds heard. Central nervous system: Alert and oriented.  Some weakness in the right upper and lower extremity compared to the left  side. Extremities: Symmetric 5 x 5 power. Skin: No rashes, lesions or ulcers.  Psychiatry: Judgement and insight appear poor. Mood & affect appropriate.    Data Reviewed: Basic Metabolic Panel: Recent Labs  Lab 03/22/19 0354  NA 138  K 3.8  CL 107  CO2 20*  GLUCOSE 142*  BUN 32*  CREATININE 1.10*  CALCIUM 9.7   Liver Function Tests: No results for input(s): AST, ALT, ALKPHOS, BILITOT, PROT, ALBUMIN in the last 168 hours. No results for input(s): LIPASE, AMYLASE in the last 168 hours. No results for input(s): AMMONIA in the last 168 hours. CBC: Recent Labs  Lab 03/22/19 0354  WBC 7.7  NEUTROABS 4.5  HGB 12.6  HCT 37.5  MCV 85.0  PLT 228   Cardiac Enzymes: No results for input(s): CKTOTAL, CKMB, CKMBINDEX, TROPONINI in the last 168 hours. BNP (last 3 results) No results for input(s): BNP in the last 8760 hours.  ProBNP (last 3 results) No results for input(s): PROBNP in the last 8760 hours.  CBG: Recent Labs  Lab 03/21/19 2119 03/22/19 0651 03/22/19 1651 03/22/19 2102 03/23/19 0616  GLUCAP 110* 140* 136* 162* 144*    Recent Results (from the past 240 hour(s))  SARS CORONAVIRUS 2 (TAT 6-24 HRS) Nasopharyngeal Nasopharyngeal Swab     Status: None   Collection Time: 03/15/19  1:46 PM   Specimen: Nasopharyngeal Swab  Result Value Ref Range Status   SARS Coronavirus 2 NEGATIVE NEGATIVE Final    Comment: (NOTE) SARS-CoV-2 target nucleic acids are NOT DETECTED. The SARS-CoV-2 RNA is generally detectable in upper and lower respiratory specimens during the acute phase of infection. Negative results do not preclude SARS-CoV-2 infection, do not rule out co-infections with other pathogens, and should  not be used as the sole basis for treatment or other patient management decisions. Negative results must be combined with clinical observations, patient history, and epidemiological information. The expected result is Negative. Fact Sheet for  Patients: HairSlick.no Fact Sheet for Healthcare Providers: quierodirigir.com This test is not yet approved or cleared by the [place] FDA and  has been authorized for detection and/or diagnosis of SARS-CoV-2 by FDA under an Emergency Use Authorization (EUA). This EUA will remain  in effect (meaning this test can be used) for the duration of the COVID-19 declaration under Section 56 4(b)(1) of the Act, 21 U.S.C. section 360bbb-3(b)(1), unless the authorization is terminated or revoked sooner. Performed at [facility], [address] [place], [place] [address]   SARS CORONAVIRUS 2 (TAT 6-24 HRS) Nasopharyngeal Nasopharyngeal Swab     Status: None   Collection Time: 03/18/19 10:13 AM   Specimen: Nasopharyngeal Swab  Result Value Ref Range Status   SARS Coronavirus 2 NEGATIVE NEGATIVE Final    Comment: (NOTE) SARS-CoV-2 target nucleic acids are NOT DETECTED. The SARS-CoV-2 RNA is generally detectable in upper and lower respiratory specimens during the acute phase of infection. Negative results do not preclude SARS-CoV-2 infection, do not rule out co-infections with other pathogens, and should not be used as the sole basis for treatment or other patient management decisions. Negative results must be combined with clinical observations, patient history, and epidemiological information. The expected result is Negative. Fact Sheet for Patients: HairSlick.no Fact Sheet for Healthcare Providers: quierodirigir.com This test is not yet approved or cleared by the [place] FDA and  has been authorized for detection and/or diagnosis of SARS-CoV-2 by FDA under an Emergency Use Authorization (EUA). This EUA will remain  in effect (meaning this test can be used) for the duration of the COVID-19 declaration under Section 56 4(b)(1) of the Act, 21 U.S.C. section  360bbb-3(b)(1), unless the authorization is terminated or revoked sooner. Performed at [facility], [address], [place], [place] [address]   Novel Coronavirus, NAA (Hosp order, Send-out to Ref Lab; TAT 18-24 hrs     Status: None   Collection Time: 03/21/19  4:28 PM   Specimen: Nasopharyngeal Swab; Respiratory  Result Value Ref Range Status   SARS-CoV-2, NAA NOT DETECTED NOT DETECTED Final    Comment: (NOTE) This nucleic acid amplification test was developed and its performance characteristics determined by [employer]. Nucleic acid amplification tests include PCR and TMA. This test has not been FDA cleared or approved. This test has been authorized by FDA under an Emergency Use Authorization (EUA). This test is only authorized for the duration of time the declaration that circumstances exist justifying the authorization of the emergency use of in vitro diagnostic tests for detection of SARS-CoV-2 virus and/or diagnosis of COVID-19 infection under section 564(b)(1) of the Act, 21 U.S.C. 010XNA-3(F) (1), unless the authorization is terminated or revoked sooner. When diagnostic testing is negative, the possibility of a false negative result should be considered in the context of a patient's recent exposures and the presence of clinical signs and symptoms consistent with COVID-19. An individual without symptoms of COVID- 19 and who is not shedding SARS-CoV-2 vi rus would expect to have a negative (not detected) result in this assay. Performed At: [facility] [address] [place], [place] [id number] Jolene Schimke MD YH:[id number]    Coronavirus Source NASOPHARYNGEAL  Final    Comment: Performed at [facility], [address] [place], [place]  [id number]     Studies: No results found.  Scheduled Meds: . aspirin EC  81 mg Oral Daily  . atorvastatin  40 mg Oral q1800  . carvedilol  3.125 mg Oral BID  . clopidogrel  75 mg Oral Daily  . insulin  aspart  0-15 Units Subcutaneous TID WC  . insulin aspart  0-5 Units Subcutaneous QHS  . irbesartan  300 mg Oral Daily  . levETIRAcetam  500 mg Oral BID  . loratadine  10 mg Oral Daily  . pantoprazole  40 mg Oral Daily  . sertraline  50 mg Oral QHS  . tetrahydrozoline  1 drop Both Eyes Daily   Continuous Infusions:  Principal Problem:   Ischemic stroke (HCC) Active Problems:   Hypertension   Hyperlipidemia   Diabetes mellitus (HCC)   Hypokalemia   Dehydration   Elevated troponin   Seizure disorder (HCC)  Time spent: 28 minutes     [facility]  If 7PM-7AM, please contact night-coverage at [email or web address], password [email or web address] 03/23/2019, 8:26 AM  LOS: 8 days

## 2019-03-23 NOTE — Progress Notes (Signed)
Physical Therapy Treatment Patient Details Name: Rebecca Gallagher MRN: [id number] DOB: [birth year] Today's Date: 03/23/2019    History of Present Illness  73 y.o. female with medical history significant of diabetes, hypertension, depression is a transfer from med [employer] for evaluation of TIA/stroke. 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    PT Comments    Pt tolerated treatment well, demonstrating improved ambulation quality with greater knee control and reduced assistance requirements. Pt also demonstrating improved transfer quality of RLE. Pt will continue to benefit from PT POC to reduce falls risk and restore independence.   Follow Up Recommendations  SNF     Equipment Recommendations  3in1 (PT)(hemiwalker)    Recommendations for Other Services       Precautions / Restrictions Precautions Precautions: Fall Restrictions Weight Bearing Restrictions: No    Mobility  Bed Mobility Overal bed mobility: Needs Assistance Bed Mobility: Supine to Sit     Supine to sit: Supervision;HOB elevated        Transfers Overall transfer level: Needs assistance Equipment used: Hemi-walker Transfers: Sit to/from [employer] Sit to Stand: Min guard Stand pivot transfers: Min assist       General transfer comment: minG sit to stand with hemiwalker, minA for stand pivot without assitive device  Ambulation/Gait Ambulation/Gait assistance: Mod assist Gait Distance (Feet): 20 Feet(1 trial modA with L rail, 2nd trial modA with hemiwalker) Assistive device: Hemi-walker(railing) Gait Pattern/deviations: Step-to pattern;Decreased weight shift to right(R knee buckling or hyperextending, PT provides knee block) Gait velocity: reduced Gait velocity interpretation: <1.31 ft/sec, indicative of household ambulator General Gait Details: PT providing knee block on R side to reduce buckling and hyperextension during stance phase. Pt with short and deliberate step to  gait pattern   Stairs             Wheelchair Mobility    Modified Rankin (Stroke Patients Only) Modified Rankin (Stroke Patients Only) Pre-Morbid Rankin Score: No symptoms Modified Rankin: Moderately severe disability     Balance Overall balance assessment: Needs assistance Sitting-balance support: Single extremity supported;Feet supported Sitting balance-Leahy Scale: Fair Sitting balance - Comments: supervision for static sitting balance   Standing balance support: Single extremity supported Standing balance-Leahy Scale: Poor Standing balance comment: minA for static standing balance                            Cognition Arousal/Alertness: Awake/alert Behavior During Therapy: WFL for tasks assessed/performed Overall Cognitive Status: Impaired/Different from baseline Area of Impairment: Memory                     Memory: Decreased short-term memory                Exercises      General Comments        Pertinent Vitals/Pain Pain Assessment: No/denies pain    Home Living                      Prior Function            PT Goals (current goals can now be found in the care plan section) Acute Rehab PT Goals Patient Stated Goal: to regain function Progress towards PT goals: Progressing toward goals    Frequency    Min 3X/week      PT Plan Current plan remains appropriate    Co-evaluation  AM-PAC PT "6 Clicks" Mobility   Outcome Measure  Help needed turning from your back to your side while in a flat bed without using bedrails?: None Help needed moving from lying on your back to sitting on the side of a flat bed without using bedrails?: None Help needed moving to and from a bed to a chair (including a wheelchair)?: A Lot Help needed standing up from a chair using your arms (e.g., wheelchair or bedside chair)?: A Lot Help needed to walk in hospital room?: A Lot Help needed climbing 3-5 steps  with a railing? : Total 6 Click Score: 15    End of Session Equipment Utilized During Treatment: Gait belt Activity Tolerance: Patient tolerated treatment well Patient left: in chair;with call bell/phone within reach   PT Visit Diagnosis: Other abnormalities of gait and mobility (R26.89)     Time: 0459-9774 PT Time Calculation (min) (ACUTE ONLY): 25 min  Charges:  $Gait Training: 23-37 mins                     Zenaida Niece, PT, DPT Acute Rehabilitation Pager: [phone]    Zenaida Niece 03/23/2019, 11:32 AM

## 2019-03-24 LAB — BASIC METABOLIC PANEL
Anion gap: 7 (ref 5–15)
BUN: 23 mg/dL (ref 8–23)
CO2: 20 mmol/L — ABNORMAL LOW (ref 22–32)
Calcium: 9.6 mg/dL (ref 8.9–10.3)
Chloride: 110 mmol/L (ref 98–111)
Creatinine, Ser: 0.93 mg/dL (ref 0.44–1.00)
GFR calc Af Amer: >60 mL/min (ref >60)
GFR calc non Af Amer: >60 mL/min (ref >60)
Glucose, Bld: 189 mg/dL — ABNORMAL HIGH (ref 70–99)
Potassium: 4.2 mmol/L (ref 3.5–5.1)
Sodium: 137 mmol/L (ref 135–145)

## 2019-03-24 LAB — CBC WITH DIFFERENTIAL/PLATELET
Abs Immature Granulocytes: 0.02 10*3/uL (ref 0.00–0.07)
Basophils Absolute: 0.1 10*3/uL (ref 0.0–0.1)
Basophils Relative: 1 %
Eosinophils Absolute: 0.3 10*3/uL (ref 0.0–0.5)
Eosinophils Relative: 4 %
HCT: 40.1 % (ref 36.0–46.0)
Hemoglobin: 13.3 g/dL (ref 12.0–15.0)
Immature Granulocytes: 0 %
Lymphocytes Relative: 36 %
Lymphs Abs: 2.5 10*3/uL (ref 0.7–4.0)
MCH: 28.6 pg (ref 26.0–34.0)
MCHC: 33.2 g/dL (ref 30.0–36.0)
MCV: 86.2 fL (ref 80.0–100.0)
Monocytes Absolute: 0.6 10*3/uL (ref 0.1–1.0)
Monocytes Relative: 9 %
Neutro Abs: 3.5 10*3/uL (ref 1.7–7.7)
Neutrophils Relative %: 50 %
Platelets: 292 10*3/uL (ref 150–400)
RBC: 4.65 MIL/uL (ref 3.87–5.11)
RDW: 13.2 % (ref 11.5–15.5)
WBC: 6.9 10*3/uL (ref 4.0–10.5)
nRBC: 0 % (ref 0.0–0.2)

## 2019-03-24 LAB — GLUCOSE, CAPILLARY: Glucose-Capillary: 109 mg/dL — ABNORMAL HIGH (ref 70–99)

## 2019-03-24 LAB — MAGNESIUM: Magnesium: 1.8 mg/dL (ref 1.7–2.4)

## 2019-03-24 MED ORDER — ASPIRIN 81 MG PO TBEC: 81.0000 mg | DELAYED_RELEASE_TABLET | Freq: Every day | ORAL | 0 refills | Status: AC

## 2019-03-24 MED ORDER — CLOPIDOGREL BISULFATE 75 MG PO TABS: 75.0000 mg | ORAL_TABLET | Freq: Every day | ORAL | 0 refills | Status: DC

## 2019-03-24 MED ORDER — LEVETIRACETAM 500 MG PO TABS: 500.0000 mg | ORAL_TABLET | Freq: Two times a day (BID) | ORAL | 0 refills | Status: DC

## 2019-03-24 MED ORDER — CARVEDILOL 3.125 MG PO TABS: 3.1250 mg | ORAL_TABLET | Freq: Two times a day (BID) | ORAL | 0 refills | Status: DC

## 2019-03-24 NOTE — Discharge Summary (Signed)
Physician Discharge Summary  Rebecca Gallagher ZOX:[id number] DOB: 03-28-1946 DOA: 03/15/2019  PCP: No primary care provider on file.  Admit date: 03/15/2019 Discharge date: 03/24/2019  Admitted From: Home Disposition: SNF  Recommendations for Outpatient Follow-up:  1. Follow up with PCP in 1-2 weeks 2. Follow with cardiology in 2 to 4 weeks 3. Follow with neurology in 2 to 4 weeks 4. Please obtain BMP/CBC in one week 5. Please follow up on the following pending results:  Home Health: None Equipment/Devices: None  Discharge Condition: Stable CODE STATUS: Full code Diet recommendation: Cardiac  Subjective: Patient seen and examined.  She has no complaints.  Her dysarthria is a stable.  Right sided weakness is improving gradually.  Brief/Interim Summary: 73 y/o femalewith medical history significant ofdiabetes, hypertension, depression is a transfer from [facility] for evaluation of TIA/stroke. Her initial work-up at [facility] was concerning for possible hypertensive emergency as patient was hypertensive upon arrival and her blood pressure was in 200s However EDP reported change in neuro status-neurology on call was consulted who recommended stat CTA and perfusion-and patient was patient was diagnosed with subacute infarct in left occipital parietal cortex.  Patient was then transferred to [facility] for further evaluation and management.  Patient did complain of right-sided weakness to the admitting physician. She lives with her daughter and she is former smoker, drinks alcohol 2-3 times per week, denies illicit drug use. She does not use walker or cane at home.  According to her,she is compliant with her home medication. Subsequently MRI of the brain was obtained which showed acute infarct of left posterior MCA territory without hemorrhage.  Chronic microvascular ischemic changes in white matter, Atherosclerotic calcification of the carotid bifurcation bilaterally without  significant stenosis. There is mild stenosis of the right vertebral artery. The left vertebral artery is widely patent. 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 There is mild stenosis in the left MCA bifurcation. There is moderate stenosis of the posterior cerebral artery bilaterally. Echocardiogram demonstrated EF of 60-65% with diastolic dysfunction and LVH. No intracardiac source of embolus is identified. There is moderate aortic stenosis. Dr. Johney Frame of card placedimplanted loop recorder.  No intracardiac source of embolus was identified.  There was moderate aortic stenosis. She was started on aspirin and Plavix combination by neurology.  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 Neurology has started the patient on Keppra and placed him on seizure precautions. Patient was seen by PT OT and SLP subsequently and they recommended CIR however family opted to go with SNF instead which has finally been arranged for her.  Patient has remained stable since last several days.  She is hemodynamically stable so she will be discharged to SNF today.  Neurology has recommended continuing aspirin and Plavix combination for total of 3 weeks which will and on 04/07/2019.  I prescribed her 2 weeks of Plavix.  She will continue aspirin after that.  All of her home medications are being resumed.  Discharge Diagnoses:  Principal Problem:   Ischemic stroke [facility]) Active Problems:   Hypertension   Hyperlipidemia   Diabetes mellitus (HCC)   Hypokalemia   Dehydration   Elevated troponin   Seizure disorder [facility])    Discharge Instructions  Discharge Instructions    Ambulatory referral to Neurology   Complete by: As directed    Follow up in stroke clinic at [facility] with Ihor Austin, NP in about 4 weeks. If not available, consider Dr. Delia Heady, Dr. Satira Sark  Penumali, or Dr. Naomie Dean.   For d/c to SNF when bed found.   Discharge patient    Complete by: As directed    Discharge disposition: 03-Skilled Nursing Facility   Discharge patient date: 03/24/2019     Allergies as of 03/24/2019      Reactions   Hydrocodone-acetaminophen Palpitations   Pregabalin Palpitations   Valsartan Palpitations   Yellow Dye Itching   [id number]:[id number] Dye+nisoldipine   Niacin Swelling   Acetaminophen    Reported by daughter    Interferons    Beta-1b   Nisoldipine    Losartan Palpitations      Medication List    TAKE these medications   aspirin 81 MG EC tablet Take 1 tablet (81 mg total) by mouth daily. Start taking on: March 25, 2019   carvedilol 3.125 MG tablet Commonly known as: COREG Take 1 tablet (3.125 mg total) by mouth 2 (two) times daily. What changed:   medication strength  how much to take   cetirizine 10 MG tablet Commonly known as: ZYRTEC Take 10 mg by mouth daily. For angioedema   clopidogrel 75 MG tablet Commonly known as: PLAVIX Take 1 tablet (75 mg total) by mouth daily for 14 days. Start taking on: March 25, 2019   EYE DROPS OP Place 2 drops into both eyes daily.   Invokana 100 MG Tabs tablet Generic drug: canagliflozin Take 150 mg by mouth daily. Before the first meal of the day   levETIRAcetam 500 MG tablet Commonly known as: KEPPRA Take 1 tablet (500 mg total) by mouth 2 (two) times daily.   olmesartan 40 MG tablet Commonly known as: BENICAR Take 40 mg by mouth daily.   omeprazole 20 MG capsule Commonly known as: PRILOSEC Take 20 mg by mouth daily. 30 minutes before morning meal   rosuvastatin 20 MG tablet Commonly known as: CRESTOR Take 20 mg by mouth daily.   sertraline 50 MG tablet Commonly known as: ZOLOFT Take 50 mg by mouth at bedtime.       Contact information for follow-up providers    [facility] Follow up on 03/29/2019.   Why: at 2 pm for post loop wound check. Contact information: [address] [place] [address] [phone]       [facility] Follow up in 4 week(s).   Specialty: Neurology Why: stroke clinic. office will call with appt date and time. Contact information: [address] [place] [address] [phone]           Contact information for after-discharge care    Destination    [facility] Preferred SNF .   Service: Skilled Nursing Contact information: [address] [place] [address] [phone]                 Allergies  Allergen Reactions  . Hydrocodone-Acetaminophen Palpitations  . Pregabalin Palpitations  . Valsartan Palpitations  . Yellow Dye Itching    YQI:[id number] Dye+nisoldipine  . Niacin Swelling  . Acetaminophen     Reported by daughter   . Interferons     Beta-1b  . Nisoldipine   . Losartan Palpitations    Consultations: Neurology and cardiology   Procedures/Studies: Ct Angio Head W Or Wo Contrast  Result Date: 03/15/2019 CLINICAL DATA:  Focal neuro deficit.  Rule out stroke.  Fall. EXAM: CT ANGIOGRAPHY HEAD AND NECK CT PERFUSION BRAIN TECHNIQUE: Multidetector CT imaging of the head  and neck was performed using the standard protocol during bolus administration of intravenous contrast. Multiplanar CT image reconstructions and MIPs were obtained to evaluate the vascular anatomy. Carotid stenosis measurements (when applicable) are obtained utilizing NASCET criteria, using the distal internal carotid diameter as the denominator. Multiphase CT imaging of the brain was performed following IV bolus contrast injection. Subsequent parametric perfusion maps were calculated using RAPID software. CONTRAST:  134mL OMNIPAQUE IOHEXOL 350 MG/ML SOLN COMPARISON:  CT head 03/15/2019 FINDINGS: CTA NECK FINDINGS Aortic arch: Standard branching. Imaged portion shows no evidence of aneurysm or dissection. No significant stenosis of the major arch vessel origins.  Atherosclerotic calcification aortic arch and proximal great vessels. Right carotid system: Mild atherosclerotic calcification right carotid bifurcation without significant stenosis Left carotid system: Mild atherosclerotic calcification left carotid bifurcation without significant stenosis Vertebral arteries: Mild stenosis origin of right vertebral artery. Right vertebral artery is patent to the basilar without additional stenosis Left vertebral artery widely patent the basilar without stenosis. Skeleton: Cervical spondylosis.  No acute skeletal abnormality. Other neck: Small thyroid nodules.  No soft tissue mass in the neck. Upper chest: Lung apices clear bilaterally. Review of the MIP images confirms the above findings CTA HEAD FINDINGS Anterior circulation: Extensive atherosclerotic calcification in the cavernous carotid bilaterally causing moderate stenosis bilaterally. Anterior and middle cerebral arteries patent bilaterally. Mild stenosis left MCA bifurcation. Posterior circulation: Both vertebral arteries patent to the basilar. PICA patent bilaterally. Basilar widely patent. Superior cerebellar and posterior cerebral arteries patent bilaterally. Moderate stenosis proximal PCA bilaterally. Venous sinuses: Normal venous enhancement Anatomic variants: None Review of the MIP images confirms the above findings CT Brain Perfusion Findings: ASPECTS: 9 CBF (<30%) Volume: 55mL Perfusion (Tmax>6.0s) volume: 45mL Mismatch Volume: 60mL Infarction Location:None IMPRESSION: 1. CT perfusion negative for acute infarct or ischemia 2. CT head demonstrates hypodensity left occipital parietal lobe suggestive of subacute infarct. 3. Atherosclerotic calcification carotid bifurcation bilaterally without significant stenosis. Mild stenosis origin of right vertebral artery. Left vertebral artery widely patent 4. 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 Mild stenosis left MCA bifurcation 5. Moderate stenosis  posterior cerebral artery bilaterally Electronically Signed   By: Franchot Gallo M.D.   On: 03/15/2019 16:47   Dg Chest 1 View  Result Date: 03/15/2019 CLINICAL DATA:  Right hip pain secondary to a fall this morning. Weakness. Hypertension. EXAM: CHEST  1 VIEW COMPARISON:  Chest x-ray dated 12/12/2009 FINDINGS: Heart size and pulmonary vascularity are normal. Aortic atherosclerosis. Lungs are clear. No effusions. No acute bone abnormality. IMPRESSION: 1. No acute abnormalities. 2. Aortic atherosclerosis. Electronically Signed   By: Lorriane Shire M.D.   On: 03/15/2019 12:01   Ct Head Wo Contrast  Result Date: 03/15/2019 CLINICAL DATA:  Status post fall. EXAM: CT HEAD WITHOUT CONTRAST CT CERVICAL SPINE WITHOUT CONTRAST TECHNIQUE: Multidetector CT imaging of the head and cervical spine was performed following the standard protocol without intravenous contrast. Multiplanar CT image reconstructions of the cervical spine were also generated. COMPARISON:  December 14, 2009. FINDINGS: CT HEAD FINDINGS Brain: Mild chronic ischemic white matter disease is noted. No mass effect or midline shift is noted. Ventricular size is within normal limits. There is no evidence of mass lesion, hemorrhage or acute infarction. Vascular: No hyperdense vessel or unexpected calcification. Skull: Normal. Negative for fracture or focal lesion. Sinuses/Orbits: No acute finding. Other: None. CT CERVICAL SPINE FINDINGS Alignment: Minimal grade 1 retrolisthesis of C4-5 is noted. Skull base and vertebrae: No acute fracture. No primary bone lesion or focal  pathologic process. Soft tissues and spinal canal: No prevertebral fluid or swelling. No visible canal hematoma. Disc levels: Moderate degenerative disc disease is noted at C3-4, C4-5 and C5-6. Upper chest: Negative. Other: Mild degenerative changes are seen involving posterior facet joints bilaterally. IMPRESSION: Mild chronic ischemic white matter disease. No acute intracranial abnormality  seen. Moderate multilevel degenerative disc disease. No acute abnormality seen in the cervical spine. Electronically Signed   By: Lupita Raider M.D.   On: 03/15/2019 11:45   Ct Angio Neck W Or Wo Contrast  Result Date: 03/15/2019 CLINICAL DATA:  Focal neuro deficit.  Rule out stroke.  Fall. EXAM: CT ANGIOGRAPHY HEAD AND NECK CT PERFUSION BRAIN TECHNIQUE: Multidetector CT imaging of the head and neck was performed using the standard protocol during bolus administration of intravenous contrast. Multiplanar CT image reconstructions and MIPs were obtained to evaluate the vascular anatomy. Carotid stenosis measurements (when applicable) are obtained utilizing NASCET criteria, using the distal internal carotid diameter as the denominator. Multiphase CT imaging of the brain was performed following IV bolus contrast injection. Subsequent parametric perfusion maps were calculated using RAPID software. CONTRAST:  OMNIPAQUE IOHEXOL 350 MG/ML SOLN COMPARISON:  CT head 03/15/2019 FINDINGS: CTA NECK FINDINGS Aortic arch: Standard branching. Imaged portion shows no evidence of aneurysm or dissection. No significant stenosis of the major arch vessel origins. Atherosclerotic calcification aortic arch and proximal great vessels. Right carotid system: Mild atherosclerotic calcification right carotid bifurcation without significant stenosis Left carotid system: Mild atherosclerotic calcification left carotid bifurcation without significant stenosis Vertebral arteries: Mild stenosis origin of right vertebral artery. Right vertebral artery is patent to the basilar without additional stenosis Left vertebral artery widely patent the basilar without stenosis. Skeleton: Cervical spondylosis.  No acute skeletal abnormality. Other neck: Small thyroid nodules.  No soft tissue mass in the neck. Upper chest: Lung apices clear bilaterally. Review of the MIP images confirms the above findings CTA HEAD FINDINGS Anterior circulation:  Extensive atherosclerotic calcification in the cavernous carotid bilaterally causing moderate stenosis bilaterally. Anterior and middle cerebral arteries patent bilaterally. Mild stenosis left MCA bifurcation. Posterior circulation: Both vertebral arteries patent to the basilar. PICA patent bilaterally. Basilar widely patent. Superior cerebellar and posterior cerebral arteries patent bilaterally. Moderate stenosis proximal PCA bilaterally. Venous sinuses: Normal venous enhancement Anatomic variants: None Review of the MIP images confirms the above findings CT Brain Perfusion Findings: ASPECTS: 9 CBF (<30%) Volume: 0mL Perfusion (Tmax>6.0s) volume: 0mL Mismatch Volume: 0mL Infarction Location:None IMPRESSION: 1. CT perfusion negative for acute infarct or ischemia 2. CT head demonstrates hypodensity left occipital parietal lobe suggestive of subacute infarct. 3. Atherosclerotic calcification carotid bifurcation bilaterally without significant stenosis. Mild stenosis origin of right vertebral artery. Left vertebral artery widely patent 4. 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 Mild stenosis left MCA bifurcation 5. Moderate stenosis posterior cerebral artery bilaterally Electronically Signed   By: Marlan Palau M.D.   On: 03/15/2019 16:47   Ct Cervical Spine Wo Contrast  Result Date: 03/15/2019 CLINICAL DATA:  Status post fall. EXAM: CT HEAD WITHOUT CONTRAST CT CERVICAL SPINE WITHOUT CONTRAST TECHNIQUE: Multidetector CT imaging of the head and cervical spine was performed following the standard protocol without intravenous contrast. Multiplanar CT image reconstructions of the cervical spine were also generated. COMPARISON:  December 14, 2009. FINDINGS: CT HEAD FINDINGS Brain: Mild chronic ischemic white matter disease is noted. No mass effect or midline shift is noted. Ventricular size is within normal limits. There is no evidence of mass lesion, hemorrhage or  acute infarction. Vascular: No  hyperdense vessel or unexpected calcification. Skull: Normal. Negative for fracture or focal lesion. Sinuses/Orbits: No acute finding. Other: None. CT CERVICAL SPINE FINDINGS Alignment: Minimal grade 1 retrolisthesis of C4-5 is noted. Skull base and vertebrae: No acute fracture. No primary bone lesion or focal pathologic process. Soft tissues and spinal canal: No prevertebral fluid or swelling. No visible canal hematoma. Disc levels: Moderate degenerative disc disease is noted at C3-4, C4-5 and C5-6. Upper chest: Negative. Other: Mild degenerative changes are seen involving posterior facet joints bilaterally. IMPRESSION: Mild chronic ischemic white matter disease. No acute intracranial abnormality seen. Moderate multilevel degenerative disc disease. No acute abnormality seen in the cervical spine. Electronically Signed   By: Lupita Raider M.D.   On: 03/15/2019 11:45   Mr Brain Wo Contrast  Result Date: 03/15/2019 CLINICAL DATA:  Altered level of consciousness.  Stroke. EXAM: MRI HEAD WITHOUT CONTRAST TECHNIQUE: Multiplanar, multiecho pulse sequences of the brain and surrounding structures were obtained without intravenous contrast. COMPARISON:  CT head and CTA head 03/15/2019 FINDINGS: Brain: Acute infarct left parietal lobe. Cortical infarct in the left posterior parietal lobe extending into the high left parietal lobe with numerous small areas of cortical infarct extending into the left frontal lobe. No other acute infarct. Negative for hemorrhage or mass. Chronic microvascular ischemic changes in the white matter. Ventricle size normal. Vascular: Normal arterial flow voids. Skull and upper cervical spine: Negative Sinuses/Orbits: Mild mucosal edema paranasal sinuses. Bilateral cataract surgery Other: None IMPRESSION: Acute infarct left posterior MCA territory without hemorrhage Chronic microvascular ischemic change in the white matter. Electronically Signed   By: Marlan Palau M.D.   On: 03/15/2019 19:32    Ct Hip Right Wo Contrast  Result Date: 03/15/2019 CLINICAL DATA:  Right hip pain.  Twisting injury. EXAM: CT OF THE RIGHT HIP WITHOUT CONTRAST TECHNIQUE: Multidetector CT imaging of the right hip was performed according to the standard protocol. Multiplanar CT image reconstructions were also generated. COMPARISON:  Radiographs, same date. FINDINGS: The right hip is normally located. Mild age related degenerative changes. No acute fracture or evidence of AVN. Mild insertional spurring changes noted along the greater trochanter. The acetabulum is intact. The visualized right hemipelvis is intact. The right SI joint appears normal. No significant muscular abnormality.  No obvious tear or hematoma. No significant intrapelvic abnormalities are identified. Moderate vascular calcifications. No inguinal mass or hernia. IMPRESSION: 1. Mild right hip joint degenerative changes but no fracture or AVN. 2. The visualized right hemipelvis is intact. Electronically Signed   By: Rudie Meyer M.D.   On: 03/15/2019 12:30   Ct Cerebral Perfusion W Contrast  Result Date: 03/15/2019 CLINICAL DATA:  Focal neuro deficit.  Rule out stroke.  Fall. EXAM: CT ANGIOGRAPHY HEAD AND NECK CT PERFUSION BRAIN TECHNIQUE: Multidetector CT imaging of the head and neck was performed using the standard protocol during bolus administration of intravenous contrast. Multiplanar CT image reconstructions and MIPs were obtained to evaluate the vascular anatomy. Carotid stenosis measurements (when applicable) are obtained utilizing NASCET criteria, using the distal internal carotid diameter as the denominator. Multiphase CT imaging of the brain was performed following IV bolus contrast injection. Subsequent parametric perfusion maps were calculated using RAPID software. CONTRAST:  OMNIPAQUE IOHEXOL 350 MG/ML SOLN COMPARISON:  CT head 03/15/2019 FINDINGS: CTA NECK FINDINGS Aortic arch: Standard branching. Imaged portion shows no evidence of  aneurysm or dissection. No significant stenosis of the major arch vessel origins. Atherosclerotic calcification aortic arch and proximal  great vessels. Right carotid system: Mild atherosclerotic calcification right carotid bifurcation without significant stenosis Left carotid system: Mild atherosclerotic calcification left carotid bifurcation without significant stenosis Vertebral arteries: Mild stenosis origin of right vertebral artery. Right vertebral artery is patent to the basilar without additional stenosis Left vertebral artery widely patent the basilar without stenosis. Skeleton: Cervical spondylosis.  No acute skeletal abnormality. Other neck: Small thyroid nodules.  No soft tissue mass in the neck. Upper chest: Lung apices clear bilaterally. Review of the MIP images confirms the above findings CTA HEAD FINDINGS Anterior circulation: Extensive atherosclerotic calcification in the cavernous carotid bilaterally causing moderate stenosis bilaterally. Anterior and middle cerebral arteries patent bilaterally. Mild stenosis left MCA bifurcation. Posterior circulation: Both vertebral arteries patent to the basilar. PICA patent bilaterally. Basilar widely patent. Superior cerebellar and posterior cerebral arteries patent bilaterally. Moderate stenosis proximal PCA bilaterally. Venous sinuses: Normal venous enhancement Anatomic variants: None Review of the MIP images confirms the above findings CT Brain Perfusion Findings: ASPECTS: 9 CBF (<30%) Volume: 0mL Perfusion (Tmax>6.0s) volume: 0mL Mismatch Volume: 0mL Infarction Location:None IMPRESSION: 1. CT perfusion negative for acute infarct or ischemia 2. CT head demonstrates hypodensity left occipital parietal lobe suggestive of subacute infarct. 3. Atherosclerotic calcification carotid bifurcation bilaterally without significant stenosis. Mild stenosis origin of right vertebral artery. Left vertebral artery widely patent 4. Moderate calcific stenosis of the cavernous  carotid bilaterally. Mild stenosis left MCA bifurcation 5. Moderate stenosis posterior cerebral artery bilaterally Electronically Signed   By: Marlan Palau M.D.   On: 03/15/2019 16:47   Dg Hip Unilat With Pelvis 2-3 Views Right  Result Date: 03/15/2019 CLINICAL DATA:  Right hip pain secondary to a fall this morning. EXAM: DG HIP (WITH OR WITHOUT PELVIS) 2-3V RIGHT COMPARISON:  None. FINDINGS: On the AP view of the pelvis there is suggestion of a hairline fracture of the right femoral neck. This is not visible on the other views in this could represent an overlying soft tissue fold. CT scan of the right hip recommended for further evaluation. IMPRESSION: 1. Possible hairline fracture of the right femoral neck. CT scan of the right hip recommended for further evaluation. 2. No other significant abnormalities of the right hip are seen. Electronically Signed   By: Francene Boyers M.D.   On: 03/15/2019 12:00   Ct Head Code Stroke Wo Contrast  Result Date: 03/15/2019 CLINICAL DATA:  Code stroke.  Stroke.  Fall. EXAM: CT HEAD WITHOUT CONTRAST TECHNIQUE: Contiguous axial images were obtained from the base of the skull through the vertex without intravenous contrast. COMPARISON:  CT head earlier today, proximally 5 hours previously FINDINGS: Brain: Ill-defined hypodensity left occipital parietal cortex is similar the prior study and most compatible with subacute infarct. Generalized atrophy. Bilateral white matter disease most compatible with chronic microvascular ischemia. Small chronic infarct right thalamus. Bilateral basal ganglia chronic small infarcts. Negative for hemorrhage or mass. Vascular: Negative for hyperdense vessel Skull: Negative Sinuses/Orbits: Mild mucosal edema paranasal sinuses. Bilateral ocular surgery. Other: None ASPECTS ([place] Stroke Program Early CT Score) - Ganglionic level infarction (caudate, lentiform nuclei, internal capsule, insula, M1-M3 cortex): 6 - Supraganglionic infarction  (M4-M6 cortex): 3 Total score (0-10 with 10 being normal): 9 IMPRESSION: 1. Hypodensity left occipital parietal cortex is unchanged from earlier today. The appearance is most consistent with subacute infarct. Recommend MRI to confirm. 2. Moderate to advanced chronic microvascular ischemic change. No acute hemorrhage 3. ASPECTS is 9 4. These results were called by telephone at the time of interpretation on  03/15/2019 at 4:35 pm to provider [facility] , who verbally acknowledged these results. Electronically Signed   By: Marlan Palau M.D.   On: 03/15/2019 16:37   Vas [place] Lower Extremity Venous (dvt)  Result Date: 03/16/2019  Lower Venous Study Indications: Stroke, and embolic.  Comparison Study: No prior. Performing Technologist: Marilynne Halsted RDMS, RVT  Examination Guidelines: A complete evaluation includes B-mode imaging, spectral Doppler, color Doppler, and power Doppler as needed of all accessible portions of each vessel. Bilateral testing is considered an integral part of a complete examination. Limited examinations for reoccurring indications may be performed as noted.  +---------+---------------+---------+-----------+----------+--------------+ RIGHT    CompressibilityPhasicitySpontaneityPropertiesThrombus Aging +---------+---------------+---------+-----------+----------+--------------+ CFV      Full           Yes      Yes                                 +---------+---------------+---------+-----------+----------+--------------+ SFJ      Full                                                        +---------+---------------+---------+-----------+----------+--------------+ FV Prox  Full                                                        +---------+---------------+---------+-----------+----------+--------------+ FV Mid   Full                                                        +---------+---------------+---------+-----------+----------+--------------+ FV  DistalFull                                                        +---------+---------------+---------+-----------+----------+--------------+ PFV      Full                                                        +---------+---------------+---------+-----------+----------+--------------+ POP      Full           Yes      Yes                                 +---------+---------------+---------+-----------+----------+--------------+ PTV      Full                                                        +---------+---------------+---------+-----------+----------+--------------+ PERO  Full                                                        +---------+---------------+---------+-----------+----------+--------------+   +---------+---------------+---------+-----------+----------+--------------+ LEFT     CompressibilityPhasicitySpontaneityPropertiesThrombus Aging +---------+---------------+---------+-----------+----------+--------------+ CFV      Full           Yes      Yes                                 +---------+---------------+---------+-----------+----------+--------------+ SFJ      Full                                                        +---------+---------------+---------+-----------+----------+--------------+ FV Prox  Full                                                        +---------+---------------+---------+-----------+----------+--------------+ FV Mid   Full                                                        +---------+---------------+---------+-----------+----------+--------------+ FV DistalFull                                                        +---------+---------------+---------+-----------+----------+--------------+ PFV      Full                                                        +---------+---------------+---------+-----------+----------+--------------+ POP      Full           Yes      Yes                                  +---------+---------------+---------+-----------+----------+--------------+ PTV      Full                                                        +---------+---------------+---------+-----------+----------+--------------+ PERO     Full                                                        +---------+---------------+---------+-----------+----------+--------------+  Summary: Right: There is no evidence of deep vein thrombosis in the lower extremity. No cystic structure found in the popliteal fossa. Left: There is no evidence of deep vein thrombosis in the lower extremity. No cystic structure found in the popliteal fossa.  *See table(s) above for measurements and observations. Electronically signed by Gretta Began MD on 03/16/2019 at 4:35:43 PM.    Final       Discharge Exam: Vitals:   03/24/19 0802 03/24/19 1138  BP: (!) 169/55 (!) 156/51  Pulse: (!) 52 (!) 51  Resp: 16 17  Temp: 98 F (36.7 C) 98.5 F (36.9 C)  SpO2: 98% 98%   Vitals:   03/23/19 2345 03/24/19 0354 03/24/19 0802 03/24/19 1138  BP: (!) 137/38 (!) 127/35 (!) 169/55 (!) 156/51  Pulse: (!) 50 (!) 51 (!) 52 (!) 51  Resp:  16 16 17   Temp:  97.6 F (36.4 C) 98 F (36.7 C) 98.5 F (36.9 C)  TempSrc:  Oral Oral Oral  SpO2:  99% 98% 98%  Weight:  52.8 kg    Height:        General: Pt is alert, awake, not in acute distress Cardiovascular: RRR, S1/S2 +, no rubs, no gallops Respiratory: CTA bilaterally, no wheezing, no rhonchi Abdominal: Soft, NT, ND, bowel sounds + Extremities: no edema, no cyanosis.   Neuro exam: 4/5 power in right upper and lower extremity.  Some dysarthria.   The results of significant diagnostics from this hospitalization (including imaging, microbiology, ancillary and laboratory) are listed below for reference.     Microbiology: Recent Results (from the past 240 hour(s))  SARS CORONAVIRUS 2 (TAT 6-24 HRS) Nasopharyngeal Nasopharyngeal Swab     Status: None    Collection Time: 03/15/19  1:46 PM   Specimen: Nasopharyngeal Swab  Result Value Ref Range Status   SARS Coronavirus 2 NEGATIVE NEGATIVE Final    Comment: (NOTE) SARS-CoV-2 target nucleic acids are NOT DETECTED. The SARS-CoV-2 RNA is generally detectable in upper and lower respiratory specimens during the acute phase of infection. Negative results do not preclude SARS-CoV-2 infection, do not rule out co-infections with other pathogens, and should not be used as the sole basis for treatment or other patient management decisions. Negative results must be combined with clinical observations, patient history, and epidemiological information. The expected result is Negative. Fact Sheet for Patients: 05/15/19 Fact Sheet for Healthcare Providers: HairSlick.no This test is not yet approved or cleared by the [place] FDA and  has been authorized for detection and/or diagnosis of SARS-CoV-2 by FDA under an Emergency Use Authorization (EUA). This EUA will remain  in effect (meaning this test can be used) for the duration of the COVID-19 declaration under Section 56 4(b)(1) of the Act, 21 U.S.C. section 360bbb-3(b)(1), unless the authorization is terminated or revoked sooner. Performed at [facility], [address], [place], [place]   SARS CORONAVIRUS 2 (TAT 6-24 HRS) Nasopharyngeal Nasopharyngeal Swab     Status: None   Collection Time: 03/18/19 10:13 AM   Specimen: Nasopharyngeal Swab  Result Value Ref Range Status   SARS Coronavirus 2 NEGATIVE NEGATIVE Final    Comment: (NOTE) SARS-CoV-2 target nucleic acids are NOT DETECTED. The SARS-CoV-2 RNA is generally detectable in upper and lower respiratory specimens during the acute phase of infection. Negative results do not preclude SARS-CoV-2 infection, do not rule out co-infections with other pathogens, and should not be used as the sole basis for  treatment or other patient management decisions.  Negative results must be combined with clinical observations, patient history, and epidemiological information. The expected result is Negative. Fact Sheet for Patients: HairSlick.no Fact Sheet for Healthcare Providers: quierodirigir.com This test is not yet approved or cleared by the [place] FDA and  has been authorized for detection and/or diagnosis of SARS-CoV-2 by FDA under an Emergency Use Authorization (EUA). This EUA will remain  in effect (meaning this test can be used) for the duration of the COVID-19 declaration under Section 56 4(b)(1) of the Act, 21 U.S.C. section 360bbb-3(b)(1), unless the authorization is terminated or revoked sooner. Performed at [facility], [address] [place], [place] [address]   Novel Coronavirus, NAA (Hosp order, Send-out to Ref Lab; TAT 18-24 hrs     Status: None   Collection Time: 03/21/19  4:28 PM   Specimen: Nasopharyngeal Swab; Respiratory  Result Value Ref Range Status   SARS-CoV-2, NAA NOT DETECTED NOT DETECTED Final    Comment: (NOTE) This nucleic acid amplification test was developed and its performance characteristics determined by [employer]. Nucleic acid amplification tests include PCR and TMA. This test has not been FDA cleared or approved. This test has been authorized by FDA under an Emergency Use Authorization (EUA). This test is only authorized for the duration of time the declaration that circumstances exist justifying the authorization of the emergency use of in vitro diagnostic tests for detection of SARS-CoV-2 virus and/or diagnosis of COVID-19 infection under section 564(b)(1) of the Act, 21 U.S.C. 604VWU-9(W) (1), unless the authorization is terminated or revoked sooner. When diagnostic testing is negative, the possibility of a false negative result should be considered in the context of a  patient's recent exposures and the presence of clinical signs and symptoms consistent with COVID-19. An individual without symptoms of COVID- 19 and who is not shedding SARS-CoV-2 vi rus would expect to have a negative (not detected) result in this assay. Performed At: [facility] [address] [place], [place] [id number] Jolene Schimke MD FA:[id number]    Coronavirus Source NASOPHARYNGEAL  Final    Comment: Performed at [facility], [address], [place], [place] [address]     Labs: BNP (last 3 results) No results for input(s): BNP in the last 8760 hours. Basic Metabolic Panel: Recent Labs  Lab 03/22/19 0354 03/23/19 0845 03/24/19 0913  NA 138 141 137  K 3.8 3.8 4.2  CL 107 111 110  CO2 20* 22 20*  GLUCOSE 142* 118* 189*  BUN 32* 26* 23  CREATININE 1.10* 0.97 0.93  CALCIUM 9.7 9.5 9.6  MG  --   --  1.8   Liver Function Tests: No results for input(s): AST, ALT, ALKPHOS, BILITOT, PROT, ALBUMIN in the last 168 hours. No results for input(s): LIPASE, AMYLASE in the last 168 hours. No results for input(s): AMMONIA in the last 168 hours. CBC: Recent Labs  Lab 03/22/19 0354 03/24/19 0913  WBC 7.7 6.9  NEUTROABS 4.5 3.5  HGB 12.6 13.3  HCT 37.5 40.1  MCV 85.0 86.2  PLT 228 292   Cardiac Enzymes: No results for input(s): CKTOTAL, CKMB, CKMBINDEX, TROPONINI in the last 168 hours. BNP: Invalid input(s): POCBNP CBG: Recent Labs  Lab 03/23/19 0616 03/23/19 1143 03/23/19 1607 03/23/19 2135 03/24/19 1139  GLUCAP 144* 124* 117* 117* 109*   D-Dimer No results for input(s): DDIMER in the last 72 hours. Hgb A1c No results for input(s): HGBA1C in the last 72 hours. Lipid Profile No results for input(s): CHOL, HDL,  LDLCALC, TRIG, CHOLHDL, LDLDIRECT in the last 72 hours. Thyroid function studies No results for input(s): TSH, T4TOTAL, T3FREE, THYROIDAB in the last 72 hours.  Invalid input(s): FREET3 Anemia work up No results for input(s):  VITAMINB12, FOLATE, FERRITIN, TIBC, IRON, RETICCTPCT in the last 72 hours. Urinalysis    Component Value Date/Time   COLORURINE YELLOW 03/15/2019 1500   APPEARANCEUR CLEAR 03/15/2019 1500   LABSPEC 1.010 03/15/2019 1500   PHURINE 6.0 03/15/2019 1500   GLUCOSEU >=500 (A) 03/15/2019 1500   HGBUR NEGATIVE 03/15/2019 1500   BILIRUBINUR NEGATIVE 03/15/2019 1500   KETONESUR 15 (A) 03/15/2019 1500   PROTEINUR NEGATIVE 03/15/2019 1500   NITRITE NEGATIVE 03/15/2019 1500   LEUKOCYTESUR NEGATIVE 03/15/2019 1500   Sepsis Labs Invalid input(s): PROCALCITONIN,  WBC,  LACTICIDVEN Microbiology Recent Results (from the past 240 hour(s))  SARS CORONAVIRUS 2 (TAT 6-24 HRS) Nasopharyngeal Nasopharyngeal Swab     Status: None   Collection Time: 03/15/19  1:46 PM   Specimen: Nasopharyngeal Swab  Result Value Ref Range Status   SARS Coronavirus 2 NEGATIVE NEGATIVE Final    Comment: (NOTE) SARS-CoV-2 target nucleic acids are NOT DETECTED. The SARS-CoV-2 RNA is generally detectable in upper and lower respiratory specimens during the acute phase of infection. Negative results do not preclude SARS-CoV-2 infection, do not rule out co-infections with other pathogens, and should not be used as the sole basis for treatment or other patient management decisions. Negative results must be combined with clinical observations, patient history, and epidemiological information. The expected result is Negative. Fact Sheet for Patients: HairSlick.no Fact Sheet for Healthcare Providers: quierodirigir.com This test is not yet approved or cleared by the [place] FDA and  has been authorized for detection and/or diagnosis of SARS-CoV-2 by FDA under an Emergency Use Authorization (EUA). This EUA will remain  in effect (meaning this test can be used) for the duration of the COVID-19 declaration under Section 56 4(b)(1) of the Act, 21 U.S.C. section  360bbb-3(b)(1), unless the authorization is terminated or revoked sooner. Performed at [facility], [address], [place], [place] [address]   SARS CORONAVIRUS 2 (TAT 6-24 HRS) Nasopharyngeal Nasopharyngeal Swab     Status: None   Collection Time: 03/18/19 10:13 AM   Specimen: Nasopharyngeal Swab  Result Value Ref Range Status   SARS Coronavirus 2 NEGATIVE NEGATIVE Final    Comment: (NOTE) SARS-CoV-2 target nucleic acids are NOT DETECTED. The SARS-CoV-2 RNA is generally detectable in upper and lower respiratory specimens during the acute phase of infection. Negative results do not preclude SARS-CoV-2 infection, do not rule out co-infections with other pathogens, and should not be used as the sole basis for treatment or other patient management decisions. Negative results must be combined with clinical observations, patient history, and epidemiological information. The expected result is Negative. Fact Sheet for Patients: HairSlick.no Fact Sheet for Healthcare Providers: quierodirigir.com This test is not yet approved or cleared by the [place] FDA and  has been authorized for detection and/or diagnosis of SARS-CoV-2 by FDA under an Emergency Use Authorization (EUA). This EUA will remain  in effect (meaning this test can be used) for the duration of the COVID-19 declaration under Section 56 4(b)(1) of the Act, 21 U.S.C. section 360bbb-3(b)(1), unless the authorization is terminated or revoked sooner. Performed at [facility], [address] [place], [place] [address]   Novel Coronavirus, NAA (Hosp order, Send-out to Ref Lab; TAT 18-24 hrs     Status: None   Collection  Time: 03/21/19  4:28 PM   Specimen: Nasopharyngeal Swab; Respiratory  Result Value Ref Range Status   SARS-CoV-2, NAA NOT DETECTED NOT DETECTED Final    Comment: (NOTE) This nucleic acid amplification test was developed and  its performance characteristics determined by [facility]. Nucleic acid amplification tests include PCR and TMA. This test has not been FDA cleared or approved. This test has been authorized by FDA under an Emergency Use Authorization (EUA). This test is only authorized for the duration of time the declaration that circumstances exist justifying the authorization of the emergency use of in vitro diagnostic tests for detection of SARS-CoV-2 virus and/or diagnosis of COVID-19 infection under section 564(b)(1) of the Act, 21 U.S.C. 161WRU-0(A360bbb-3(b) (1), unless the authorization is terminated or revoked sooner. When diagnostic testing is negative, the possibility of a false negative result should be considered in the context of a patient's recent exposures and the presence of clinical signs and symptoms consistent with COVID-19. An individual without symptoms of COVID- 19 and who is not shedding SARS-CoV-2 vi rus would expect to have a negative (not detected) result in this assay. Performed At: [facility] [address] [place], [place] [id number] Jolene SchimkeNagendra Sanjai MD YN:[id number]:[phone]    Coronavirus Source NASOPHARYNGEAL  Final    Comment: Performed at [facility], [address] [place], [place] [address]     Time coordinating discharge: Over 30 minutes  SIGNED:   Hughie Clossavi , MD  [facility] 03/24/2019, 12:42 PM  If 7PM-7AM, please contact night-coverage [email or web address] Password [email or web address]

## 2019-03-24 NOTE — TOC Transition Note (Signed)
Transition of Care [facility]) - CM/SW Discharge Note   Patient Details  Name: Rebecca Gallagher MRN: [id number] Date of Birth: [birth year]  Transition [facility]) CM/SW Contact:  Pollie Friar, RN Phone Number: 03/24/2019, 1:29 PM   Clinical Narrative:    PASAR received. [employer] extended auth until Monday Oct 19th. [employer] updated. Pt will d/c to [facility] today and transport via [facility]. Bedside RN updated and d/c packet at the desk.  CM called Sherri and updated her (daughter).  Room: 506 Number for report: [phone]   Final next level of care: Skilled Nursing Facility Barriers to Discharge: No Barriers Identified   Patient Goals and CMS Choice   CMS Medicare.gov Compare Post Acute Care list provided to:: Patient Represenative (must comment) Choice offered to / list presented to : Adult Children  Discharge Placement PASRR number recieved: 03/24/19            Patient [facility] bed at: [facility] Patient to be transferred to facility by: [facility] Name of family member notified: Sherri--daughter Patient and family notified of of transfer: 03/24/19  Discharge Plan and Services In-house Referral: Clinical Social Work Discharge Planning Services: [employer] Consult Post Acute Care Choice: [facility]                               Social Determinants of Health (SDOH) Interventions     Readmission Risk Interventions No flowsheet data found.

## 2019-03-24 NOTE — Progress Notes (Signed)
Physical Therapy Treatment Patient Details Name: Rebecca Gallagher MRN: [id number] DOB: 30-Nov-1945 Today's Date: 03/24/2019    History of Present Illness  73 y.o. female with medical history significant of diabetes, hypertension, depression is a transfer from [place] for evaluation of TIA/stroke. 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    PT Comments    Pt tolerated treatment well, increasing ambulation tolerance and decreasing PT assistance requirements. Pt with improved R knee control throughout gait cycle. Pt will continue to benefit from PT POC to improve LE strength and power, normalize gait pattern, and improve transfer technique.    Follow Up Recommendations  SNF     Equipment Recommendations  3in1 (PT)    Recommendations for Other Services       Precautions / Restrictions Precautions Precautions: Fall Restrictions Weight Bearing Restrictions: No    Mobility  Bed Mobility Overal bed mobility: Needs Assistance Bed Mobility: Supine to Sit     Supine to sit: Supervision;HOB elevated        Transfers Overall transfer level: Needs assistance Equipment used: Hemi-walker Transfers: Sit to/from [employer] Transfers Sit to Stand: Min guard Stand pivot transfers: Min assist          Ambulation/Gait Ambulation/Gait assistance: Min assist;Min guard Gait Distance (Feet): 50 Feet Assistive device: Hemi-walker Gait Pattern/deviations: Step-to pattern;Decreased stance time - right;Trunk flexed Gait velocity: reduced Gait velocity interpretation: <1.31 ft/sec, indicative of household ambulator General Gait Details: PT initially providing minA for R knee block to prevent hyperextension during stance phase, however pt with improved R knee control with increased ambulation distance, progressing to [employer] Mobility    Modified Rankin (Stroke Patients Only) Modified Rankin (Stroke Patients Only) Pre-Morbid  Rankin Score: No symptoms Modified Rankin: Moderately severe disability     Balance Overall balance assessment: Needs assistance Sitting-balance support: No upper extremity supported;Feet supported Sitting balance-Leahy Scale: Fair Sitting balance - Comments: supervision for static sitting balance   Standing balance support: Single extremity supported Standing balance-Leahy Scale: Fair Standing balance comment: close supervision with LUE support of hemiwalker                            Cognition Arousal/Alertness: Awake/alert Behavior During Therapy: WFL for tasks assessed/performed Overall Cognitive Status: Impaired/Different from baseline Area of Impairment: Memory                     Memory: Decreased short-term memory       Problem Solving: Slow processing        Exercises      General Comments        Pertinent Vitals/Pain Pain Assessment: No/denies pain    Home Living                      Prior Function            PT Goals (current goals can now be found in the care plan section) Acute Rehab PT Goals Patient Stated Goal: to regain function Progress towards PT goals: Progressing toward goals    Frequency    Min 3X/week      PT Plan Current plan remains appropriate    Co-evaluation              AM-PAC PT "6 Clicks" Mobility   Outcome Measure  Help needed turning from  your back to your side while in a flat bed without using bedrails?: None Help needed moving from lying on your back to sitting on the side of a flat bed without using bedrails?: None Help needed moving to and from a bed to a chair (including a wheelchair)?: A Little Help needed standing up from a chair using your arms (e.g., wheelchair or bedside chair)?: A Little Help needed to walk in hospital room?: A Little Help needed climbing 3-5 steps with a railing? : Total 6 Click Score: 18    End of Session Equipment Utilized During Treatment: Gait  belt Activity Tolerance: Patient tolerated treatment well Patient left: in chair;with call bell/phone within reach Nurse Communication: Mobility status PT Visit Diagnosis: Other abnormalities of gait and mobility (R26.89)     Time: 1829-9371 PT Time Calculation (min) (ACUTE ONLY): 25 min  Charges:  $Gait Training: 8-22 mins $Therapeutic Activity: 8-22 mins                     Arlyss Gandy, PT, DPT Acute Rehabilitation Pager: [phone]    Arlyss Gandy 03/24/2019, 2:03 PM

## 2019-03-24 NOTE — Progress Notes (Signed)
Pt discharged to [facility]. IV removed with no bleeding noted. Report called and received from [place], Therapist, sports at [employer]. Pt transported via stretcher by [facility] with all of her belongings.

## 2019-03-25 ENCOUNTER — Encounter: Payer: Self-pay | Admitting: Internal Medicine

## 2019-03-25 ENCOUNTER — Non-Acute Institutional Stay (SKILLED_NURSING_FACILITY): Payer: Medicare HMO | Admitting: Internal Medicine

## 2019-03-25 DIAGNOSIS — G40909 Epilepsy, unspecified, not intractable, without status epilepticus: Secondary | ICD-10-CM | POA: Diagnosis not present

## 2019-03-25 DIAGNOSIS — F32A Depression, unspecified: Secondary | ICD-10-CM

## 2019-03-25 DIAGNOSIS — I639 Cerebral infarction, unspecified: Secondary | ICD-10-CM

## 2019-03-25 DIAGNOSIS — I10 Essential (primary) hypertension: Secondary | ICD-10-CM | POA: Diagnosis not present

## 2019-03-25 DIAGNOSIS — K219 Gastro-esophageal reflux disease without esophagitis: Secondary | ICD-10-CM

## 2019-03-25 DIAGNOSIS — F329 Major depressive disorder, single episode, unspecified: Secondary | ICD-10-CM

## 2019-03-25 DIAGNOSIS — E1159 Type 2 diabetes mellitus with other circulatory complications: Secondary | ICD-10-CM

## 2019-03-25 DIAGNOSIS — E785 Hyperlipidemia, unspecified: Secondary | ICD-10-CM

## 2019-03-25 NOTE — Progress Notes (Signed)
: Provider:  Margit Hanks., MD Location:  [facility] Room Number: 506-P Place of Service:  SNF [phone])  PCP: Margit Hanks, MD Patient Care Team: Margit Hanks, MD as PCP - General (Internal Medicine)  Extended Emergency Contact Information Primary Emergency Contact: Orlin Hilding Address: [address]          [place], [place] [address] [place] Mobile Phone: [phone] Relation: Daughter     Allergies: Hydrocodone-acetaminophen, Pregabalin, Valsartan, Yellow dye, Niacin, Acetaminophen, Interferons, Nisoldipine, and Losartan  Chief Complaint  Patient presents with   New Admit To SNF    New admission to [facility]   HPI: Patient is a 73 y.o. female with diabetes, hypertension, depression, who presented to med [facility] for evaluation of TIA/stroke.  Admits that her [place] her blood pressure was in the 200s.  Neurology was called and recommended a stat CTA and perfusion which showed a subacute infarct on the left occipital parietal cortex.  Patient was then transferred to [facility] for further evaluation and management.  Patient was admitted to [facility] from 10/6 - 15 where MRI of the brain showed an acute infarct of the left posterior MCA territory without hemorrhage.  There were chronic microvascular ischemic changes in the white matter.  Echo demonstrated EF of 60 to 65% with diastolic dysfunction and LVH.patient did complain of right-sided weakness.  Patient was started on aspirin and Plavix per neurology.  EEG demonstrated left parietal lobe [place] and neurology started the patient on Keppra for seizure precautions.  Patient is recommended for skilled nursing facility.  Where she will be on aspirin and Plavix for 3 weeks with a stop date for the Plavix of 10/29 and she will continue aspirin after that.  While at skilled nursing facility patient will be followed for GERD treated with  Prilosec, hyperlipidemia treated with Crestor and depression treated with Zoloft. :  Allergies as of 03/25/2019      Reactions   Hydrocodone-acetaminophen Palpitations   Pregabalin Palpitations   Valsartan Palpitations   Yellow Dye Itching   MVH:QIONGE Dye+nisoldipine   Niacin Swelling   Acetaminophen    Reported by daughter    Interferons    Beta-1b   Nisoldipine    Losartan Palpitations      Medication List       Accurate as of March 25, 2019 11:59 PM. If you have any questions, ask your nurse or doctor.        aspirin 81 MG EC tablet Take 1 tablet (81 mg total) by mouth daily.   carvedilol 3.125 MG tablet Commonly known as: COREG Take 1 tablet (3.125 mg total) by mouth 2 (two) times daily.   cetirizine 10 MG tablet Commonly known as: ZYRTEC Take 10 mg by mouth daily. For angioedema   clopidogrel 75 MG tablet Commonly known as: PLAVIX Take 1 tablet (75 mg total) by mouth daily for 14 days.   EYE DROPS OP Place 2 drops into both eyes daily.   Invokana 100 MG Tabs tablet Generic drug: canagliflozin Take 150 mg by mouth daily. Before the first meal of the day   levETIRAcetam 500 MG tablet Commonly known as: KEPPRA Take 1 tablet (500 mg total) by mouth 2 (two) times daily.   olmesartan 40 MG tablet Commonly known as: BENICAR Take 40 mg by mouth daily.   omeprazole 20 MG capsule Commonly known as: PRILOSEC Take 20 mg by mouth  daily. 30 minutes before morning meal   rosuvastatin 20 MG tablet Commonly known as: CRESTOR Take 20 mg by mouth daily.   sertraline 50 MG tablet Commonly known as: ZOLOFT Take 50 mg by mouth at bedtime.       No orders of the defined types were placed in this encounter.   Immunization History  Administered Date(s) Administered   Influenza, High Dose Seasonal PF 03/01/2019    Social History   Tobacco Use   Smoking status: Former Smoker   Smokeless tobacco: Never Used  Substance Use Topics   Alcohol use: Yes      Comment: occ    Family history is   2000    Family History  Medical History Relation Name Comments  Hyperlipidemia Brother    Hypertension Brother    Stroke Brother    Cancer Father  colon  Heart disease Father  MI, massive  Hyperlipidemia Father    Hypertension Father    Heart disease Mother  CHF  Hyperlipidemia Mother    Hypertension Mother      History reviewed. No pertinent family history.    Review of Systems  DATA OBTAINED: from patient GENERAL:  no fevers, fatigue, appetite changes SKIN: No itching, or rash EYES: No eye pain, redness, discharge EARS: No earache, tinnitus, change in hearing NOSE: No congestion, drainage or bleeding  MOUTH/THROAT: No mouth or tooth pain, No sore throat RESPIRATORY: No cough, wheezing, SOB CARDIAC: No chest pain, palpitations, lower extremity edema  GI: No abdominal pain, No N/V/D or constipation, No heartburn or reflux  GU: No dysuria, frequency or urgency, or incontinence  MUSCULOSKELETAL: No unrelieved bone/joint pain NEUROLOGIC: No headache, dizziness; right-sided weakness which is improving PSYCHIATRIC: No c/o anxiety or sadness   Vitals:   03/25/19 1221  BP: 130/60  Pulse: 60  Resp: 18  Temp: (!) 97.1 F (36.2 C)  SpO2: 99%    SpO2 Readings from Last 1 Encounters:  03/25/19 99%   Body mass index is 23.41 kg/m.     Physical Exam  GENERAL APPEARANCE: Alert, conversant,  No acute distress.  SKIN: No diaphoresis rash HEAD: Normocephalic, atraumatic  EYES: Conjunctiva/lids clear. Pupils round, reactive. EOMs intact.  EARS: External exam WNL, canals clear. Hearing grossly normal.  NOSE: No deformity or discharge.  MOUTH/THROAT: Lips w/o lesions  RESPIRATORY: Breathing is even, unlabored. Lung sounds are clear   CARDIOVASCULAR: Heart RRR no murmurs, rubs or gallops. No peripheral edema.   GASTROINTESTINAL: Abdomen is soft, non-tender, not distended w/ normal bowel sounds. GENITOURINARY:  Bladder non tender, not distended  MUSCULOSKELETAL: No abnormal joints or musculature NEUROLOGIC:  Cranial nerves 2-12 grossly intact. Moves all extremities with the right side upper and lower extremity weakness PSYCHIATRIC: Mood and affect appropriate to situation, no behavioral issues  Patient Active Problem List   Diagnosis Date Noted   Seizure disorder ([place]) 03/17/2019   TIA (transient ischemic attack) 03/15/2019   Hypertension 03/15/2019   Hyperlipidemia 03/15/2019   Diabetes mellitus ([place]) 03/15/2019   Hypokalemia 03/15/2019   Dehydration 03/15/2019   Elevated troponin 03/15/2019   Ischemic stroke ([place]) 03/15/2019      Labs reviewed: Basic Metabolic Panel:    Component Value Date/Time   NA 137 03/24/2019 0913   K 4.2 03/24/2019 0913   CL 110 03/24/2019 0913   CO2 20 (L) 03/24/2019 0913   GLUCOSE 189 (H) 03/24/2019 0913   BUN 23 03/24/2019 0913   CREATININE 0.93 03/24/2019 0913   CALCIUM 9.6 03/24/2019 0913  GFRNONAA >60 03/24/2019 0913   GFRAA >60 03/24/2019 0913    Recent Labs    03/22/19 0354 03/23/19 0845 03/24/19 0913  NA 138 141 137  K 3.8 3.8 4.2  CL 107 111 110  CO2 20* 22 20*  GLUCOSE 142* 118* 189*  BUN 32* 26* 23  CREATININE 1.10* 0.97 0.93  CALCIUM 9.7 9.5 9.6  MG  --   --  1.8   Liver Function Tests: No results for input(s): AST, ALT, ALKPHOS, BILITOT, PROT, ALBUMIN in the last 8760 hours. No results for input(s): LIPASE, AMYLASE in the last 8760 hours. No results for input(s): AMMONIA in the last 8760 hours. CBC: Recent Labs    03/15/19 1158 03/15/19 1722 03/22/19 0354 03/24/19 0913  WBC 8.0 7.9 7.7 6.9  NEUTROABS 5.5  --  4.5 3.5  HGB 14.1 13.4 12.6 13.3  HCT 42.4 38.7 37.5 40.1  MCV 84.1 83.6 85.0 86.2  PLT 264 255 228 292   Lipid Recent Labs    03/16/19 0500  CHOL 216*  HDL 51  LDLCALC 144*  TRIG 106    Cardiac Enzymes: No results for input(s): CKTOTAL, CKMB, CKMBINDEX, TROPONINI in the last 8760  hours. BNP: No results for input(s): BNP in the last 8760 hours. No results found for: [facility] Lab Results  Component Value Date   HGBA1C 7.5 (H) 03/15/2019   No results found for: TSH No results found for: VITAMINB12 No results found for: FOLATE No results found for: IRON, TIBC, FERRITIN  Imaging and Procedures obtained prior to SNF admission: Ct Angio Head W Or Wo Contrast  Result Date: 03/15/2019 CLINICAL DATA:  Focal neuro deficit.  Rule out stroke.  Fall. EXAM: CT ANGIOGRAPHY HEAD AND NECK CT PERFUSION BRAIN TECHNIQUE: Multidetector CT imaging of the head and neck was performed using the standard protocol during bolus administration of intravenous contrast. Multiplanar CT image reconstructions and MIPs were obtained to evaluate the vascular anatomy. Carotid stenosis measurements (when applicable) are obtained utilizing NASCET criteria, using the distal internal carotid diameter as the denominator. Multiphase CT imaging of the brain was performed following IV bolus contrast injection. Subsequent parametric perfusion maps were calculated using RAPID software. CONTRAST:  OMNIPAQUE IOHEXOL 350 MG/ML SOLN COMPARISON:  CT head 03/15/2019 FINDINGS: CTA NECK FINDINGS Aortic arch: Standard branching. Imaged portion shows no evidence of aneurysm or dissection. No significant stenosis of the major arch vessel origins. Atherosclerotic calcification aortic arch and proximal great vessels. Right carotid system: Mild atherosclerotic calcification right carotid bifurcation without significant stenosis Left carotid system: Mild atherosclerotic calcification left carotid bifurcation without significant stenosis Vertebral arteries: Mild stenosis origin of right vertebral artery. Right vertebral artery is patent to the basilar without additional stenosis Left vertebral artery widely patent the basilar without stenosis. Skeleton: Cervical spondylosis.  No acute skeletal abnormality. Other neck: Small  thyroid nodules.  No soft tissue mass in the neck. Upper chest: Lung apices clear bilaterally. Review of the MIP images confirms the above findings CTA HEAD FINDINGS Anterior circulation: Extensive atherosclerotic calcification in the cavernous carotid bilaterally causing moderate stenosis bilaterally. Anterior and middle cerebral arteries patent bilaterally. Mild stenosis left MCA bifurcation. Posterior circulation: Both vertebral arteries patent to the basilar. PICA patent bilaterally. Basilar widely patent. Superior cerebellar and posterior cerebral arteries patent bilaterally. Moderate stenosis proximal PCA bilaterally. Venous sinuses: Normal venous enhancement Anatomic variants: None Review of the MIP images confirms the above findings CT Brain Perfusion Findings: ASPECTS: 9 CBF (<30%) Volume: 58mL Perfusion (Tmax>6.0s) volume: 7mL  Mismatch Volume: 0mL Infarction Location:None IMPRESSION: 1. CT perfusion negative for acute infarct or ischemia 2. CT head demonstrates hypodensity left occipital parietal lobe suggestive of subacute infarct. 3. Atherosclerotic calcification carotid bifurcation bilaterally without significant stenosis. Mild stenosis origin of right vertebral artery. Left vertebral artery widely patent 4. 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 Mild stenosis left MCA bifurcation 5. Moderate stenosis posterior cerebral artery bilaterally Electronically Signed   By: Marlan Palau M.D.   On: 03/15/2019 16:47   Dg Chest 1 View  Result Date: 03/15/2019 CLINICAL DATA:  Right hip pain secondary to a fall this morning. Weakness. Hypertension. EXAM: CHEST  1 VIEW COMPARISON:  Chest x-ray dated 12/12/2009 FINDINGS: Heart size and pulmonary vascularity are normal. Aortic atherosclerosis. Lungs are clear. No effusions. No acute bone abnormality. IMPRESSION: 1. No acute abnormalities. 2. Aortic atherosclerosis. Electronically Signed   By: Francene Boyers M.D.   On: 03/15/2019 12:01    Ct Head Wo Contrast  Result Date: 03/15/2019 CLINICAL DATA:  Status post fall. EXAM: CT HEAD WITHOUT CONTRAST CT CERVICAL SPINE WITHOUT CONTRAST TECHNIQUE: Multidetector CT imaging of the head and cervical spine was performed following the standard protocol without intravenous contrast. Multiplanar CT image reconstructions of the cervical spine were also generated. COMPARISON:  December 14, 2009. FINDINGS: CT HEAD FINDINGS Brain: Mild chronic ischemic white matter disease is noted. No mass effect or midline shift is noted. Ventricular size is within normal limits. There is no evidence of mass lesion, hemorrhage or acute infarction. Vascular: No hyperdense vessel or unexpected calcification. Skull: Normal. Negative for fracture or focal lesion. Sinuses/Orbits: No acute finding. Other: None. CT CERVICAL SPINE FINDINGS Alignment: Minimal grade 1 retrolisthesis of C4-5 is noted. Skull base and vertebrae: No acute fracture. No primary bone lesion or focal pathologic process. Soft tissues and spinal canal: No prevertebral fluid or swelling. No visible canal hematoma. Disc levels: Moderate degenerative disc disease is noted at C3-4, C4-5 and C5-6. Upper chest: Negative. Other: Mild degenerative changes are seen involving posterior facet joints bilaterally. IMPRESSION: Mild chronic ischemic white matter disease. No acute intracranial abnormality seen. Moderate multilevel degenerative disc disease. No acute abnormality seen in the cervical spine. Electronically Signed   By: Lupita Raider M.D.   On: 03/15/2019 11:45   Ct Angio Neck W Or Wo Contrast  Result Date: 03/15/2019 CLINICAL DATA:  Focal neuro deficit.  Rule out stroke.  Fall. EXAM: CT ANGIOGRAPHY HEAD AND NECK CT PERFUSION BRAIN TECHNIQUE: Multidetector CT imaging of the head and neck was performed using the standard protocol during bolus administration of intravenous contrast. Multiplanar CT image reconstructions and MIPs were obtained to evaluate the vascular  anatomy. Carotid stenosis measurements (when applicable) are obtained utilizing NASCET criteria, using the distal internal carotid diameter as the denominator. Multiphase CT imaging of the brain was performed following IV bolus contrast injection. Subsequent parametric perfusion maps were calculated using RAPID software. CONTRAST:  OMNIPAQUE IOHEXOL 350 MG/ML SOLN COMPARISON:  CT head 03/15/2019 FINDINGS: CTA NECK FINDINGS Aortic arch: Standard branching. Imaged portion shows no evidence of aneurysm or dissection. No significant stenosis of the major arch vessel origins. Atherosclerotic calcification aortic arch and proximal great vessels. Right carotid system: Mild atherosclerotic calcification right carotid bifurcation without significant stenosis Left carotid system: Mild atherosclerotic calcification left carotid bifurcation without significant stenosis Vertebral arteries: Mild stenosis origin of right vertebral artery. Right vertebral artery is patent to the basilar without additional stenosis Left vertebral artery widely patent the basilar without stenosis.  Skeleton: Cervical spondylosis.  No acute skeletal abnormality. Other neck: Small thyroid nodules.  No soft tissue mass in the neck. Upper chest: Lung apices clear bilaterally. Review of the MIP images confirms the above findings CTA HEAD FINDINGS Anterior circulation: Extensive atherosclerotic calcification in the cavernous carotid bilaterally causing moderate stenosis bilaterally. Anterior and middle cerebral arteries patent bilaterally. Mild stenosis left MCA bifurcation. Posterior circulation: Both vertebral arteries patent to the basilar. PICA patent bilaterally. Basilar widely patent. Superior cerebellar and posterior cerebral arteries patent bilaterally. Moderate stenosis proximal PCA bilaterally. Venous sinuses: Normal venous enhancement Anatomic variants: None Review of the MIP images confirms the above findings CT Brain Perfusion Findings:  ASPECTS: 9 CBF (<30%) Volume: 0mL Perfusion (Tmax>6.0s) volume: 0mL Mismatch Volume: 0mL Infarction Location:None IMPRESSION: 1. CT perfusion negative for acute infarct or ischemia 2. CT head demonstrates hypodensity left occipital parietal lobe suggestive of subacute infarct. 3. Atherosclerotic calcification carotid bifurcation bilaterally without significant stenosis. Mild stenosis origin of right vertebral artery. Left vertebral artery widely patent 4. 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 Mild stenosis left MCA bifurcation 5. Moderate stenosis posterior cerebral artery bilaterally Electronically Signed   By: Marlan Palau M.D.   On: 03/15/2019 16:47   Ct Cervical Spine Wo Contrast  Result Date: 03/15/2019 CLINICAL DATA:  Status post fall. EXAM: CT HEAD WITHOUT CONTRAST CT CERVICAL SPINE WITHOUT CONTRAST TECHNIQUE: Multidetector CT imaging of the head and cervical spine was performed following the standard protocol without intravenous contrast. Multiplanar CT image reconstructions of the cervical spine were also generated. COMPARISON:  December 14, 2009. FINDINGS: CT HEAD FINDINGS Brain: Mild chronic ischemic white matter disease is noted. No mass effect or midline shift is noted. Ventricular size is within normal limits. There is no evidence of mass lesion, hemorrhage or acute infarction. Vascular: No hyperdense vessel or unexpected calcification. Skull: Normal. Negative for fracture or focal lesion. Sinuses/Orbits: No acute finding. Other: None. CT CERVICAL SPINE FINDINGS Alignment: Minimal grade 1 retrolisthesis of C4-5 is noted. Skull base and vertebrae: No acute fracture. No primary bone lesion or focal pathologic process. Soft tissues and spinal canal: No prevertebral fluid or swelling. No visible canal hematoma. Disc levels: Moderate degenerative disc disease is noted at C3-4, C4-5 and C5-6. Upper chest: Negative. Other: Mild degenerative changes are seen involving posterior  facet joints bilaterally. IMPRESSION: Mild chronic ischemic white matter disease. No acute intracranial abnormality seen. Moderate multilevel degenerative disc disease. No acute abnormality seen in the cervical spine. Electronically Signed   By: Lupita Raider M.D.   On: 03/15/2019 11:45   Mr Brain Wo Contrast  Result Date: 03/15/2019 CLINICAL DATA:  Altered level of consciousness.  Stroke. EXAM: MRI HEAD WITHOUT CONTRAST TECHNIQUE: Multiplanar, multiecho pulse sequences of the brain and surrounding structures were obtained without intravenous contrast. COMPARISON:  CT head and CTA head 03/15/2019 FINDINGS: Brain: Acute infarct left parietal lobe. Cortical infarct in the left posterior parietal lobe extending into the high left parietal lobe with numerous small areas of cortical infarct extending into the left frontal lobe. No other acute infarct. Negative for hemorrhage or mass. Chronic microvascular ischemic changes in the white matter. Ventricle size normal. Vascular: Normal arterial flow voids. Skull and upper cervical spine: Negative Sinuses/Orbits: Mild mucosal edema paranasal sinuses. Bilateral cataract surgery Other: None IMPRESSION: Acute infarct left posterior MCA territory without hemorrhage Chronic microvascular ischemic change in the white matter. Electronically Signed   By: Marlan Palau M.D.   On: 03/15/2019 19:32   Ct Hip  Right Wo Contrast  Result Date: 03/15/2019 CLINICAL DATA:  Right hip pain.  Twisting injury. EXAM: CT OF THE RIGHT HIP WITHOUT CONTRAST TECHNIQUE: Multidetector CT imaging of the right hip was performed according to the standard protocol. Multiplanar CT image reconstructions were also generated. COMPARISON:  Radiographs, same date. FINDINGS: The right hip is normally located. Mild age related degenerative changes. No acute fracture or evidence of AVN. Mild insertional spurring changes noted along the greater trochanter. The acetabulum is intact. The visualized right  hemipelvis is intact. The right SI joint appears normal. No significant muscular abnormality.  No obvious tear or hematoma. No significant intrapelvic abnormalities are identified. Moderate vascular calcifications. No inguinal mass or hernia. IMPRESSION: 1. Mild right hip joint degenerative changes but no fracture or AVN. 2. The visualized right hemipelvis is intact. Electronically Signed   By: Rudie Meyer M.D.   On: 03/15/2019 12:30   Ct Cerebral Perfusion W Contrast  Result Date: 03/15/2019 CLINICAL DATA:  Focal neuro deficit.  Rule out stroke.  Fall. EXAM: CT ANGIOGRAPHY HEAD AND NECK CT PERFUSION BRAIN TECHNIQUE: Multidetector CT imaging of the head and neck was performed using the standard protocol during bolus administration of intravenous contrast. Multiplanar CT image reconstructions and MIPs were obtained to evaluate the vascular anatomy. Carotid stenosis measurements (when applicable) are obtained utilizing NASCET criteria, using the distal internal carotid diameter as the denominator. Multiphase CT imaging of the brain was performed following IV bolus contrast injection. Subsequent parametric perfusion maps were calculated using RAPID software. CONTRAST:  OMNIPAQUE IOHEXOL 350 MG/ML SOLN COMPARISON:  CT head 03/15/2019 FINDINGS: CTA NECK FINDINGS Aortic arch: Standard branching. Imaged portion shows no evidence of aneurysm or dissection. No significant stenosis of the major arch vessel origins. Atherosclerotic calcification aortic arch and proximal great vessels. Right carotid system: Mild atherosclerotic calcification right carotid bifurcation without significant stenosis Left carotid system: Mild atherosclerotic calcification left carotid bifurcation without significant stenosis Vertebral arteries: Mild stenosis origin of right vertebral artery. Right vertebral artery is patent to the basilar without additional stenosis Left vertebral artery widely patent the basilar without stenosis.  Skeleton: Cervical spondylosis.  No acute skeletal abnormality. Other neck: Small thyroid nodules.  No soft tissue mass in the neck. Upper chest: Lung apices clear bilaterally. Review of the MIP images confirms the above findings CTA HEAD FINDINGS Anterior circulation: Extensive atherosclerotic calcification in the cavernous carotid bilaterally causing moderate stenosis bilaterally. Anterior and middle cerebral arteries patent bilaterally. Mild stenosis left MCA bifurcation. Posterior circulation: Both vertebral arteries patent to the basilar. PICA patent bilaterally. Basilar widely patent. Superior cerebellar and posterior cerebral arteries patent bilaterally. Moderate stenosis proximal PCA bilaterally. Venous sinuses: Normal venous enhancement Anatomic variants: None Review of the MIP images confirms the above findings CT Brain Perfusion Findings: ASPECTS: 9 CBF (<30%) Volume: 0mL Perfusion (Tmax>6.0s) volume: 0mL Mismatch Volume: 0mL Infarction Location:None IMPRESSION: 1. CT perfusion negative for acute infarct or ischemia 2. CT head demonstrates hypodensity left occipital parietal lobe suggestive of subacute infarct. 3. Atherosclerotic calcification carotid bifurcation bilaterally without significant stenosis. Mild stenosis origin of right vertebral artery. Left vertebral artery widely patent 4. 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 Mild stenosis left MCA bifurcation 5. Moderate stenosis posterior cerebral artery bilaterally Electronically Signed   By: Marlan Palau M.D.   On: 03/15/2019 16:47   Dg Hip Unilat With Pelvis 2-3 Views Right  Result Date: 03/15/2019 CLINICAL DATA:  Right hip pain secondary to a fall this morning. EXAM: DG HIP (WITH  OR WITHOUT PELVIS) 2-3V RIGHT COMPARISON:  None. FINDINGS: On the AP view of the pelvis there is suggestion of a hairline fracture of the right femoral neck. This is not visible on the other views in this could represent an overlying soft  tissue fold. CT scan of the right hip recommended for further evaluation. IMPRESSION: 1. Possible hairline fracture of the right femoral neck. CT scan of the right hip recommended for further evaluation. 2. No other significant abnormalities of the right hip are seen. Electronically Signed   By: Francene BoyersJames  Maxwell M.D.   On: 03/15/2019 12:00   Ct Head Code Stroke Wo Contrast  Result Date: 03/15/2019 CLINICAL DATA:  Code stroke.  Stroke.  Fall. EXAM: CT HEAD WITHOUT CONTRAST TECHNIQUE: Contiguous axial images were obtained from the base of the skull through the vertex without intravenous contrast. COMPARISON:  CT head earlier today, proximally 5 hours previously FINDINGS: Brain: Ill-defined hypodensity left occipital parietal cortex is similar the prior study and most compatible with subacute infarct. Generalized atrophy. Bilateral white matter disease most compatible with chronic microvascular ischemia. Small chronic infarct right thalamus. Bilateral basal ganglia chronic small infarcts. Negative for hemorrhage or mass. Vascular: Negative for hyperdense vessel Skull: Negative Sinuses/Orbits: Mild mucosal edema paranasal sinuses. Bilateral ocular surgery. Other: None ASPECTS ([place] Stroke Program Early CT Score) - Ganglionic level infarction (caudate, lentiform nuclei, internal capsule, insula, M1-M3 cortex): 6 - Supraganglionic infarction (M4-M6 cortex): 3 Total score (0-10 with 10 being normal): 9 IMPRESSION: 1. Hypodensity left occipital parietal cortex is unchanged from earlier today. The appearance is most consistent with subacute infarct. Recommend MRI to confirm. 2. Moderate to advanced chronic microvascular ischemic change. No acute hemorrhage 3. ASPECTS is 9 4. These results were called by telephone at the time of interpretation on 03/15/2019 at 4:35 pm to provider [facility] , who verbally acknowledged these results. Electronically Signed   By: Marlan Palauharles  Clark M.D.   On: 03/15/2019 16:37   Vas [place] Lower  Extremity Venous (dvt)  Result Date: 03/16/2019  Lower Venous Study Indications: Stroke, and embolic.  Comparison Study: No prior. Performing Technologist: Marilynne Halstedita Sturdivant RDMS, RVT  Examination Guidelines: A complete evaluation includes B-mode imaging, spectral Doppler, color Doppler, and power Doppler as needed of all accessible portions of each vessel. Bilateral testing is considered an integral part of a complete examination. Limited examinations for reoccurring indications may be performed as noted.  +---------+---------------+---------+-----------+----------+--------------+  RIGHT     Compressibility Phasicity Spontaneity Properties Thrombus Aging  +---------+---------------+---------+-----------+----------+--------------+  CFV       Full            Yes       Yes                                    +---------+---------------+---------+-----------+----------+--------------+  SFJ       Full                                                             +---------+---------------+---------+-----------+----------+--------------+  FV Prox   Full                                                             +---------+---------------+---------+-----------+----------+--------------+  FV Mid    Full                                                             +---------+---------------+---------+-----------+----------+--------------+  FV Distal Full                                                             +---------+---------------+---------+-----------+----------+--------------+  PFV       Full                                                             +---------+---------------+---------+-----------+----------+--------------+  POP       Full            Yes       Yes                                    +---------+---------------+---------+-----------+----------+--------------+  PTV       Full                                                              +---------+---------------+---------+-----------+----------+--------------+  PERO      Full                                                             +---------+---------------+---------+-----------+----------+--------------+   +---------+---------------+---------+-----------+----------+--------------+  LEFT      Compressibility Phasicity Spontaneity Properties Thrombus Aging  +---------+---------------+---------+-----------+----------+--------------+  CFV       Full            Yes       Yes                                    +---------+---------------+---------+-----------+----------+--------------+  SFJ       Full                                                             +---------+---------------+---------+-----------+----------+--------------+  FV Prox   Full                                                             +---------+---------------+---------+-----------+----------+--------------+    FV Mid    Full                                                             +---------+---------------+---------+-----------+----------+--------------+  FV Distal Full                                                             +---------+---------------+---------+-----------+----------+--------------+  PFV       Full                                                             +---------+---------------+---------+-----------+----------+--------------+  POP       Full            Yes       Yes                                    +---------+---------------+---------+-----------+----------+--------------+  PTV       Full                                                             +---------+---------------+---------+-----------+----------+--------------+  PERO      Full                                                             +---------+---------------+---------+-----------+----------+--------------+     Summary: Right: There is no evidence of deep vein thrombosis in the lower extremity. No cystic structure found in the  popliteal fossa. Left: There is no evidence of deep vein thrombosis in the lower extremity. No cystic structure found in the popliteal fossa.  *See table(s) above for measurements and observations. Electronically signed by Gretta Began MD on 03/16/2019 at 4:35:43 PM.    Final      Not all labs, radiology exams or other studies done during hospitalization come through on my EPIC note; however they are reviewed by me.    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  Patient was started on aspirin and Plavix by neurology.  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  Neurology recommended aspirin and Plavix combination for total of 3 weeks with a stop date of 1029 for the Plavix after which the aspirin will be continued.  Patient is admitted to skilled nursing facility for OT/PT.  While at skilled nursing facility patient will be followed for GERD treated with Prilosec, hyperlipidemia treated with Crestor and depression treated with Zoloft. SNF-continue ASA 81 mg indefinitely and continue Plavix 75 mg for 14 days with an end date of 10/29  Malignant hypertension with systolic blood pressures in the 200s SNF-continue Coreg 3.125 mg twice daily, and Benicar 40 mg daily; will monitor  GERD SNF-not stated as uncontrolled; continue to Prilosec 20 mg daily  Hyperlipidemia SNF-not stated as uncontrolled; continue Crestor 20 mg daily  Depression SNF-appears controlled; continue Zoloft 50 mg nightly  Diabetes mellitus SNF-not stated as uncontrolled; continue Invokana ([employer] flow 7 (150 mg every morning  Time spent greater than 45 minutes;> 50% of time with patient was spent reviewing records, labs, tests and studies,  counseling and developing plan of care  Margit Hanks MD

## 2019-03-26 ENCOUNTER — Encounter: Payer: Self-pay | Admitting: Internal Medicine

## 2019-03-26 DIAGNOSIS — F32A Depression, unspecified: Secondary | ICD-10-CM | POA: Insufficient documentation

## 2019-03-26 DIAGNOSIS — F329 Major depressive disorder, single episode, unspecified: Secondary | ICD-10-CM | POA: Insufficient documentation

## 2019-03-26 DIAGNOSIS — I10 Essential (primary) hypertension: Secondary | ICD-10-CM | POA: Insufficient documentation

## 2019-03-26 DIAGNOSIS — K219 Gastro-esophageal reflux disease without esophagitis: Secondary | ICD-10-CM | POA: Insufficient documentation

## 2019-03-29 ENCOUNTER — Ambulatory Visit: Payer: Medicare HMO

## 2019-03-31 ENCOUNTER — Non-Acute Institutional Stay (SKILLED_NURSING_FACILITY): Payer: Medicare HMO | Admitting: Internal Medicine

## 2019-03-31 DIAGNOSIS — I63512 Cerebral infarction due to unspecified occlusion or stenosis of left middle cerebral artery: Secondary | ICD-10-CM

## 2019-03-31 DIAGNOSIS — I10 Essential (primary) hypertension: Secondary | ICD-10-CM

## 2019-03-31 DIAGNOSIS — E870 Hyperosmolality and hypernatremia: Secondary | ICD-10-CM | POA: Diagnosis not present

## 2019-03-31 LAB — BASIC METABOLIC PANEL
BUN: 39 — AB (ref 4–21)
Creatinine: 1.1 (ref 0.5–1.1)
Glucose: 124
Potassium: 4.6 (ref 3.4–5.3)
Sodium: 145 (ref 137–147)

## 2019-03-31 LAB — CBC AND DIFFERENTIAL
HCT: 38 (ref 36–46)
Hemoglobin: 12.5 (ref 12.0–16.0)
Neutrophils Absolute: 2
Platelets: 253 (ref 150–399)
WBC: 6.3

## 2019-04-01 ENCOUNTER — Encounter: Payer: Self-pay | Admitting: Internal Medicine

## 2019-04-01 NOTE — Progress Notes (Signed)
This is an acute visit.  Level of care skilled.  [facility]  Chief complaint-acute visit secondary to follow-up labs including minimally elevated calcium-high normal sodium.  History of present illness.  Patient is a very pleasant 74 year old female who is here for rehab after sustaining CVA-with right-sided weakness which has improved.  She also has a diagnosis of GERD as well as hypertension diabetes depression hyperlipidemia.  Patient has improved and gained strength she has been doing well-per nursing.  Routine labs were done which have shown relative stability however her sodium is borderline at 145-calcium is minimally elevated 10.3 she is not on any calcium.  Calciums in the hospital appear to be more in the nines range-.  Per nursing she is eating and drinking well and patient feels she is doing this as well.  Vital signs are stable she has no complaints today actually when I saw her she was eating her dinner and appeared to have a decent appetite.    Past Medical History:  Diagnosis Date  . Depression   . Diabetes mellitus without complication ([place])   . Hypertension          Past Surgical History:  Procedure Laterality Date  . ABDOMINAL HYSTERECTOMY    . CATARACT EXTRACTION    . EYE SURGERY    . TOE AMPUTATION       reports that she has quit smoking. She has never used smokeless tobacco. She reports current alcohol use. She reports that she does not use drugs.     Allergies as of 03/25/2019      Reactions   Hydrocodone-acetaminophen Palpitations   Pregabalin Palpitations   Valsartan Palpitations   Yellow Dye Itching   HEN:IDPOEU Dye+nisoldipine   Niacin Swelling   Acetaminophen    Reported by daughter    Interferons    Beta-1b   Nisoldipine    Losartan Palpitations         Medication List             aspirin 81 MG EC tablet Take 1 tablet (81 mg total) by mouth daily.   carvedilol  3.125 MG tablet Commonly known as: COREG Take 1 tablet (3.125 mg total) by mouth 2 (two) times daily.   cetirizine 10 MG tablet Commonly known as: ZYRTEC Take 10 mg by mouth daily. For angioedema   clopidogrel 75 MG tablet Commonly known as: PLAVIX Take 1 tablet (75 mg total) by mouth daily for 14 days.   EYE DROPS OP Place 2 drops into both eyes daily.   Invokana 100 MG Tabs tablet Generic drug: canagliflozin Take 150 mg by mouth daily. Before the first meal of the day   levETIRAcetam 500 MG tablet Commonly known as: KEPPRA Take 1 tablet (500 mg total) by mouth 2 (two) times daily.   olmesartan 40 MG tablet Commonly known as: BENICAR Take 40 mg by mouth daily.   omeprazole 20 MG capsule Commonly known as: PRILOSEC Take 20 mg by mouth daily. 30 minutes before morning meal   rosuvastatin 20 MG tablet Commonly known as: CRESTOR Take 20 mg by mouth daily.   sertraline 50 MG tablet Commonly known as: ZOLOFT Take 50 mg by mouth at bedtime.       No orders of the defined types were placed in this encounter.       Immunization History  Administered Date(s) Administered  . Influenza, High Dose Seasonal PF 03/01/2019    Social History  Tobacco Use  . Smoking status: Former Games developermoker  . Smokeless tobacco: Never Used  Substance Use Topics  . Alcohol use: Yes    Comment: occ    Family history is   [id number]    Family History  Medical History Relation Name Comments  Hyperlipidemia Brother    Hypertension Brother    Stroke Brother    Cancer Father  colon  Heart disease Father  MI, massive  Hyperlipidemia Father    Hypertension Father    Heart disease Mother  CHF  Hyperlipidemia Mother    Hypertension Mother      Review of systems.  In general she not complaining of any fever chills feels she is getting stronger.  Skin does not complain of rashes or itching.  Head ears eyes nose mouth and throat is not  complaining of any visual changes or sore throat.  Respiratory does not complain of shortness of breath or cough.  Cardiac denies chest pain or edema.  GI does not complain of abdominal discomfort nausea vomiting diarrhea constipation.  GU no complaints of dysuria. Or back pain    Musculoskeletal she does have some residual right-sided weakness but this has improved she is able to use a walker requires supervision.--She is not complaining of pain  Neurologic as noted above she is not complaining of syncope numbness headache or dizziness.  And psych does not complain of being depressed or anxious she does have a history depression but this appears to be stable continues to be in good spirits with a positive outlook    Physical exam.  Temperature is 97.1 pulse 68 respirations 18 blood pressure 110/65.  In general this is a very pleasant elderly female no distress.  Her skin is warm and dry.  Eyes visual acuity appears to be intact sclera and conjunctive are clear.  Oropharynx is clear mucous membranes moist.  Chest is clear to auscultation there is no labored breathing.  Heart is regular rate and rhythm she does not really have significant lower extremity edema.  Abdomen is soft nontender with positive bowel sounds.  Musculoskeletal is able to move all extremities x4 appears to have some slight right-sided weakness compared to left this is the upper and lower right extremities.  Neurologic again does have some residual right-sided weakness as noted above cranial nerves appear to be grossly intact her speech is clear.  Psych she is alert and oriented very pleasant and appropriate   Assessment and plan.  1.  Mildly elevated calcium -- at this point will monitor she is not on calcium supplementation will recheck another metabolic panel in 1 week-it appears baseline has been more in the nines per review of previous labs-this may be slight slight lab variation but will recheck  She is not complaining of any significant pain or discomfort.  2.-Borderline hypernatremia-again this is high normal at 145-I did speak with her and encouraged her to drink her fluids-we will have this rechecked as well next week.  3.  History of CVA with residual right-sided weakness-this appears to be improving she is on aspirin and Plavix Plavix --she will be on the Plavix until October 29 and then she will be on aspirin-she appears to be doing well clinically making progress  #4 history of hypertension this appears to be stable on Coreg 3.125 mg twice daily and Benicar 40 mg a day-recent blood pressures 110/65-103/64.  5.  History of diabetes continues on Invokana  appears to be fairly stable her blood sugar this morning  was 120--hemoglobin A1c was 7.5 in the hospital-at this point continue to monitor   [phone]

## 2019-04-07 ENCOUNTER — Non-Acute Institutional Stay (SKILLED_NURSING_FACILITY): Payer: Medicare HMO | Admitting: Internal Medicine

## 2019-04-07 DIAGNOSIS — I63512 Cerebral infarction due to unspecified occlusion or stenosis of left middle cerebral artery: Secondary | ICD-10-CM

## 2019-04-07 DIAGNOSIS — I10 Essential (primary) hypertension: Secondary | ICD-10-CM

## 2019-04-07 DIAGNOSIS — F32A Depression, unspecified: Secondary | ICD-10-CM

## 2019-04-07 DIAGNOSIS — G40909 Epilepsy, unspecified, not intractable, without status epilepticus: Secondary | ICD-10-CM | POA: Diagnosis not present

## 2019-04-07 DIAGNOSIS — E1149 Type 2 diabetes mellitus with other diabetic neurological complication: Secondary | ICD-10-CM | POA: Diagnosis not present

## 2019-04-07 DIAGNOSIS — F329 Major depressive disorder, single episode, unspecified: Secondary | ICD-10-CM

## 2019-04-07 LAB — BASIC METABOLIC PANEL
BUN: 28 — AB (ref 4–21)
Creatinine: 0.8 (ref 0.5–1.1)
Glucose: 116
Potassium: 4.3 (ref 3.4–5.3)
Sodium: 144 (ref 137–147)

## 2019-04-08 ENCOUNTER — Encounter: Payer: Self-pay | Admitting: Internal Medicine

## 2019-04-08 ENCOUNTER — Other Ambulatory Visit: Payer: Self-pay | Admitting: Internal Medicine

## 2019-04-08 DIAGNOSIS — E1149 Type 2 diabetes mellitus with other diabetic neurological complication: Secondary | ICD-10-CM | POA: Insufficient documentation

## 2019-04-08 MED ORDER — LEVETIRACETAM 500 MG PO TABS: 500.0000 mg | ORAL_TABLET | Freq: Two times a day (BID) | ORAL | 0 refills | Status: DC

## 2019-04-08 MED ORDER — INVOKANA 100 MG PO TABS: ORAL_TABLET | ORAL | 0 refills | Status: AC

## 2019-04-08 MED ORDER — CARVEDILOL 3.125 MG PO TABS: 3.1250 mg | ORAL_TABLET | Freq: Two times a day (BID) | ORAL | 0 refills | Status: DC

## 2019-04-08 MED ORDER — OLMESARTAN MEDOXOMIL 40 MG PO TABS: 40.0000 mg | ORAL_TABLET | Freq: Every day | ORAL | 0 refills | Status: AC

## 2019-04-08 MED ORDER — ROSUVASTATIN CALCIUM 20 MG PO TABS: 20.0000 mg | ORAL_TABLET | Freq: Every day | ORAL | 0 refills | Status: AC

## 2019-04-08 MED ORDER — SERTRALINE HCL 50 MG PO TABS: 50.0000 mg | ORAL_TABLET | Freq: Every day | ORAL | 0 refills | Status: AC

## 2019-04-08 MED ORDER — OMEPRAZOLE 20 MG PO CPDR: 20.0000 mg | DELAYED_RELEASE_CAPSULE | Freq: Every day | ORAL | 0 refills | Status: AC

## 2019-04-08 MED ORDER — CETIRIZINE HCL 10 MG PO TABS: 10.0000 mg | ORAL_TABLET | Freq: Every day | ORAL | 0 refills | Status: DC

## 2019-04-08 NOTE — Progress Notes (Addendum)
Location:   [facility] Room Number: 101/P Place of Service:  SNF (31)  Provider: Estill Batten  PCP: Margit Hanks, MD Patient Care Team: Margit Hanks, MD as PCP - General (Internal Medicine)  Extended Emergency Contact Information Primary Emergency Contact: Jordan,Sherri Address: [address]          [place], [place] [address] [place] Mobile Phone: [phone] Relation: Daughter  Code Status: DNR Goals of care:  Advanced Directive information Advanced Directives 04/07/2019  Does Patient Have a Medical Advance Directive? Yes  Type of Advance Directive Out of facility DNR (pink MOST or yellow form)  Does patient want to make changes to medical advance directive? -  Pre-existing out of facility DNR order (yellow form or pink MOST form) Yellow form placed in chart (order not valid for inpatient use)     Allergies  Allergen Reactions   Hydrocodone-Acetaminophen Palpitations   Pregabalin Palpitations   Valsartan Palpitations   Yellow Dye Itching    VHQ:IONGEX Dye+nisoldipine   Niacin Swelling   Acetaminophen     Reported by daughter    Interferons     Beta-1b   Nisoldipine    Losartan Palpitations    Chief Complaint  Patient presents with   Discharge Note    Discharge Visit    HPI:  73 y.o. female seen for discharge from facility possibly this weekend.  Currently patient and family is filing an appeal with the insurance company but if declined suspect she may be going home this weekend.  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  She also has a history of GERD as well as hypertension diabetes depression and hyperlipidemia.  Her stay here has been fairly unremarkable-but she feels she would gain more strength by working with therapy here-again awaiting insurance company input.  She does not really have any acute complaints we did  order a metabolic panel secondary to a minimally elevated calcium of 10.3-she is not on any calcium supplementation-calcium level on today's lab is stable again at 10.3.  In regards to her CVA she has been on aspirin and Plavix she completed her course of Plavix as of today and will be on aspirin she is also on Keppra 500 mg twice daily for seizure prophylaxis.  In regards to diabetes she is on Ivokana 150 mg a day and per nursing this has been stable and well-controlled.  She does have a history of hypertension she is on Coreg 3.125 mg twice daily and Benicar 40 mg a day this is been stable as well blood pressure today is 140/64.  She also continues on Zoloft for depression which appears to be well-controlled she continues to be in good spirits.  She is on Crestor 20 mg a day with a history of hyperlipidemia.  Currently she is sitting in her chair comfortably does not really have any complaints again she says she would like to stay a bit longer if possible.      Past Medical History:  Diagnosis Date   Depression    Diabetes mellitus without complication (HCC)    Hypertension     Past Surgical History:  Procedure Laterality Date   ABDOMINAL HYSTERECTOMY     CATARACT EXTRACTION     EYE SURGERY     LOOP RECORDER INSERTION N/A 03/17/2019   Procedure: LOOP RECORDER INSERTION;  Surgeon: Hillis Range, MD;  Location: [facility]AS[facility];  Service: Cardiovascular;  Laterality: N/A;   TOE AMPUTATION        reports that she has quit smoking. She has never used smokeless tobacco. She reports current alcohol use. She reports that she does not use drugs. Social History   Socioeconomic History   Marital status: Single    Spouse name: Not on file   Number of children: Not on file   Years of education: Not on file   Highest education level: Not on file  Occupational History   Not on file  Social Needs   Financial resource strain: Not on file   Food insecurity    Worry:  Not on file    Inability: Not on file   Transportation needs    Medical: Not on file    Non-medical: Not on file  Tobacco Use   Smoking status: Former Smoker   Smokeless tobacco: Never Used  Substance and Sexual Activity   Alcohol use: Yes    Comment: occ   Drug use: Never   Sexual activity: Not on file  Lifestyle   Physical activity    Days per week: Not on file    Minutes per session: Not on file   Stress: Not on file  Relationships   Social connections    Talks on phone: Not on file    Gets together: Not on file    Attends religious service: Not on file    Active member of club or organization: Not on file    Attends meetings of clubs or organizations: Not on file    Relationship status: Not on file   Intimate partner violence    Fear of current or ex partner: Not on file    Emotionally abused: Not on file    Physically abused: Not on file    Forced sexual activity: Not on file  Other Topics Concern   Not on file  Social History Narrative   Former smoker.  Single.  Occasional drinker.    Functional Status Survey:    Allergies  Allergen Reactions   Hydrocodone-Acetaminophen Palpitations   Pregabalin Palpitations   Valsartan Palpitations   Yellow Dye Itching    BJY:NWGNFA Dye+nisoldipine   Niacin Swelling   Acetaminophen     Reported by daughter    Interferons     Beta-1b   Nisoldipine    Losartan Palpitations    Pertinent  Health Maintenance Due  Topic Date Due   FOOT EXAM  06/11/1955   OPHTHALMOLOGY EXAM  06/11/1955   MAMMOGRAM  06/11/1995   COLONOSCOPY  06/11/1995   DEXA SCAN  06/10/2010   PNA vac Low Risk Adult (1 of 2 - PCV13) 06/10/2010   HEMOGLOBIN A1C  09/13/2019   INFLUENZA VACCINE  Completed    Medications: Allergies as of 04/07/2019      Reactions   Hydrocodone-acetaminophen Palpitations   Pregabalin Palpitations   Valsartan Palpitations   Yellow Dye Itching   OZH:YQMVHQ Dye+nisoldipine   Niacin  Swelling   Acetaminophen    Reported by daughter    Interferons    Beta-1b   Nisoldipine    Losartan Palpitations      Medication List       Accurate as of April 07, 2019 11:59 PM. If you have any questions, ask your nurse or doctor.        STOP taking these medications   clopidogrel 75 MG tablet Commonly known as: PLAVIX     TAKE these medications   ARTIFICIAL TEARS OP  Place 2 drops into both eyes daily.   aspirin 81 MG EC tablet Take 1 tablet (81 mg total) by mouth daily.   carvedilol 3.125 MG tablet Commonly known as: COREG Take 1 tablet (3.125 mg total) by mouth 2 (two) times daily.   cetirizine 10 MG tablet Commonly known as: ZYRTEC Take 10 mg by mouth daily. For angioedema   Invokana 100 MG Tabs tablet Generic drug: canagliflozin Take 150 mg by mouth daily. Before the first meal of the day   levETIRAcetam 500 MG tablet Commonly known as: KEPPRA Take 1 tablet (500 mg total) by mouth 2 (two) times daily.   olmesartan 40 MG tablet Commonly known as: BENICAR Take 40 mg by mouth daily.   omeprazole 20 MG capsule Commonly known as: PRILOSEC Take 20 mg by mouth daily. 30 minutes before morning meal   rosuvastatin 20 MG tablet Commonly known as: CRESTOR Take 20 mg by mouth daily.   sertraline 50 MG tablet Commonly known as: ZOLOFT Take 50 mg by mouth at bedtime.       Review of Systems   In general she is not complaining of any fever or chills.  Skin is not complaining of rashes or itching.  Head ears eyes nose mouth and throat is not complain of visual changes or sore throat.  Respiratory does not complain of shortness of breath or cough.  Cardiac does not complain of chest pain or palpitations or edema.  GI is not complaining of abdominal discomfort nausea vomiting diarrhea constipation.  GU is not complaining of dysuria  Musculoskeletal is not complaining of joint pain she does have some continued weakness.  Neurologic still has some  mild right-sided weakness compared to the left does not complain of headache dizziness or numbness.  Psych does not complain of overt depression or anxiety I do note she is on Zoloft  Vitals:   04/08/19 1346  BP: 135/62  Pulse: 62  Resp: 18  Temp: 97.9 F (36.6 C)  TempSrc: Oral  SpO2: 99%  Weight: 112 lb 12.8 oz (51.2 kg)  Height: 4\' 8"  (1.422 m)  Vital signs April 07, 2019  Blood pressure 140/64-pulse 62 respirations 18 temperature 97.1   Body mass index is 25.29 kg/m. Physical Exam   General this is a pleasant elderly female no distress.  Her skin is warm and dry.  Eyes visual acuity appears to be intact sclera and conjunctive are clear.  Oropharynx is clear mucous membranes moist.  Chest is clear to auscultation there is no labored breathing.  Heart is regular rhythm-on auscultation got a rate in the mid 50s-there is no significant lower extremity edema.  Abdomen is soft nontender with positive bowel sounds.  Musculoskeletal is able to move all extremities x4 has some slight right-sided weakness compared to the left.  Neurologic as noted above her speech is clear cranial nerves appear grossly intact she has some mild right-sided weakness  Psych she is alert and oriented very pleasant and appropriate.       Labs reviewed:  April 07, 2019.  Sodium 144 potassium 4.3 BUN 27.8 creatinine 0.84.  Calcium was 10.3.  March 31, 2019.  WBC 6.3 hemoglobin 12.5 platelets 253.   Basic Metabolic Panel: Recent Labs    03/22/19 0354 03/23/19 0845 03/24/19 0913  NA 138 141 137  K 3.8 3.8 4.2  CL 107 111 110  CO2 20* 22 20*  GLUCOSE 142* 118* 189*  BUN 32* 26* 23  CREATININE 1.10* 0.97 0.93  CALCIUM 9.7 9.5 9.6  MG  --   --  1.8   Liver Function Tests: No results for input(s): AST, ALT, ALKPHOS, BILITOT, PROT, ALBUMIN in the last 8760 hours. No results for input(s): LIPASE, AMYLASE in the last 8760 hours. No results for input(s): AMMONIA in the  last 8760 hours. CBC: Recent Labs    03/15/19 1158 03/15/19 1722 03/22/19 0354 03/24/19 0913  WBC 8.0 7.9 7.7 6.9  NEUTROABS 5.5  --  4.5 3.5  HGB 14.1 13.4 12.6 13.3  HCT 42.4 38.7 37.5 40.1  MCV 84.1 83.6 85.0 86.2  PLT 264 255 228 292   Cardiac Enzymes: No results for input(s): CKTOTAL, CKMB, CKMBINDEX, TROPONINI in the last 8760 hours. BNP: Invalid input(s): POCBNP CBG: Recent Labs    03/23/19 1607 03/23/19 2135 03/24/19 1139  GLUCAP 117* 117* 109*    Procedures and Imaging Studies During Stay: Ct Angio Head W Or Wo Contrast  Result Date: 03/15/2019 CLINICAL DATA:  Focal neuro deficit.  Rule out stroke.  Fall. EXAM: CT ANGIOGRAPHY HEAD AND NECK CT PERFUSION BRAIN TECHNIQUE: Multidetector CT imaging of the head and neck was performed using the standard protocol during bolus administration of intravenous contrast. Multiplanar CT image reconstructions and MIPs were obtained to evaluate the vascular anatomy. Carotid stenosis measurements (when applicable) are obtained utilizing NASCET criteria, using the distal internal carotid diameter as the denominator. Multiphase CT imaging of the brain was performed following IV bolus contrast injection. Subsequent parametric perfusion maps were calculated using RAPID software. CONTRAST:  OMNIPAQUE IOHEXOL 350 MG/ML SOLN COMPARISON:  CT head 03/15/2019 FINDINGS: CTA NECK FINDINGS Aortic arch: Standard branching. Imaged portion shows no evidence of aneurysm or dissection. No significant stenosis of the major arch vessel origins. Atherosclerotic calcification aortic arch and proximal great vessels. Right carotid system: Mild atherosclerotic calcification right carotid bifurcation without significant stenosis Left carotid system: Mild atherosclerotic calcification left carotid bifurcation without significant stenosis Vertebral arteries: Mild stenosis origin of right vertebral artery. Right vertebral artery is patent to the basilar without  additional stenosis Left vertebral artery widely patent the basilar without stenosis. Skeleton: Cervical spondylosis.  No acute skeletal abnormality. Other neck: Small thyroid nodules.  No soft tissue mass in the neck. Upper chest: Lung apices clear bilaterally. Review of the MIP images confirms the above findings CTA HEAD FINDINGS Anterior circulation: Extensive atherosclerotic calcification in the cavernous carotid bilaterally causing moderate stenosis bilaterally. Anterior and middle cerebral arteries patent bilaterally. Mild stenosis left MCA bifurcation. Posterior circulation: Both vertebral arteries patent to the basilar. PICA patent bilaterally. Basilar widely patent. Superior cerebellar and posterior cerebral arteries patent bilaterally. Moderate stenosis proximal PCA bilaterally. Venous sinuses: Normal venous enhancement Anatomic variants: None Review of the MIP images confirms the above findings CT Brain Perfusion Findings: ASPECTS: 9 CBF (<30%) Volume: 0mL Perfusion (Tmax>6.0s) volume: 0mL Mismatch Volume: 0mL Infarction Location:None IMPRESSION: 1. CT perfusion negative for acute infarct or ischemia 2. CT head demonstrates hypodensity left occipital parietal lobe suggestive of subacute infarct. 3. Atherosclerotic calcification carotid bifurcation bilaterally without significant stenosis. Mild stenosis origin of right vertebral artery. Left vertebral artery widely patent 4. 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 Mild stenosis left MCA bifurcation 5. Moderate stenosis posterior cerebral artery bilaterally Electronically Signed   By: Marlan Palau M.D.   On: 03/15/2019 16:47   Dg Chest 1 View  Result Date: 03/15/2019 CLINICAL DATA:  Right hip pain secondary to a fall this morning. Weakness. Hypertension. EXAM: CHEST  1 VIEW COMPARISON:  Chest x-ray dated 12/12/2009 FINDINGS: Heart size and pulmonary vascularity are normal. Aortic atherosclerosis. Lungs are clear. No effusions.  No acute bone abnormality. IMPRESSION: 1. No acute abnormalities. 2. Aortic atherosclerosis. Electronically Signed   By: Francene Boyers M.D.   On: 03/15/2019 12:01   Ct Head Wo Contrast  Result Date: 03/15/2019 CLINICAL DATA:  Status post fall. EXAM: CT HEAD WITHOUT CONTRAST CT CERVICAL SPINE WITHOUT CONTRAST TECHNIQUE: Multidetector CT imaging of the head and cervical spine was performed following the standard protocol without intravenous contrast. Multiplanar CT image reconstructions of the cervical spine were also generated. COMPARISON:  December 14, 2009. FINDINGS: CT HEAD FINDINGS Brain: Mild chronic ischemic white matter disease is noted. No mass effect or midline shift is noted. Ventricular size is within normal limits. There is no evidence of mass lesion, hemorrhage or acute infarction. Vascular: No hyperdense vessel or unexpected calcification. Skull: Normal. Negative for fracture or focal lesion. Sinuses/Orbits: No acute finding. Other: None. CT CERVICAL SPINE FINDINGS Alignment: Minimal grade 1 retrolisthesis of C4-5 is noted. Skull base and vertebrae: No acute fracture. No primary bone lesion or focal pathologic process. Soft tissues and spinal canal: No prevertebral fluid or swelling. No visible canal hematoma. Disc levels: Moderate degenerative disc disease is noted at C3-4, C4-5 and C5-6. Upper chest: Negative. Other: Mild degenerative changes are seen involving posterior facet joints bilaterally. IMPRESSION: Mild chronic ischemic white matter disease. No acute intracranial abnormality seen. Moderate multilevel degenerative disc disease. No acute abnormality seen in the cervical spine. Electronically Signed   By: Lupita Raider M.D.   On: 03/15/2019 11:45   Ct Angio Neck W Or Wo Contrast  Result Date: 03/15/2019 CLINICAL DATA:  Focal neuro deficit.  Rule out stroke.  Fall. EXAM: CT ANGIOGRAPHY HEAD AND NECK CT PERFUSION BRAIN TECHNIQUE: Multidetector CT imaging of the head and neck was performed  using the standard protocol during bolus administration of intravenous contrast. Multiplanar CT image reconstructions and MIPs were obtained to evaluate the vascular anatomy. Carotid stenosis measurements (when applicable) are obtained utilizing NASCET criteria, using the distal internal carotid diameter as the denominator. Multiphase CT imaging of the brain was performed following IV bolus contrast injection. Subsequent parametric perfusion maps were calculated using RAPID software. CONTRAST:  OMNIPAQUE IOHEXOL 350 MG/ML SOLN COMPARISON:  CT head 03/15/2019 FINDINGS: CTA NECK FINDINGS Aortic arch: Standard branching. Imaged portion shows no evidence of aneurysm or dissection. No significant stenosis of the major arch vessel origins. Atherosclerotic calcification aortic arch and proximal great vessels. Right carotid system: Mild atherosclerotic calcification right carotid bifurcation without significant stenosis Left carotid system: Mild atherosclerotic calcification left carotid bifurcation without significant stenosis Vertebral arteries: Mild stenosis origin of right vertebral artery. Right vertebral artery is patent to the basilar without additional stenosis Left vertebral artery widely patent the basilar without stenosis. Skeleton: Cervical spondylosis.  No acute skeletal abnormality. Other neck: Small thyroid nodules.  No soft tissue mass in the neck. Upper chest: Lung apices clear bilaterally. Review of the MIP images confirms the above findings CTA HEAD FINDINGS Anterior circulation: Extensive atherosclerotic calcification in the cavernous carotid bilaterally causing moderate stenosis bilaterally. Anterior and middle cerebral arteries patent bilaterally. Mild stenosis left MCA bifurcation. Posterior circulation: Both vertebral arteries patent to the basilar. PICA patent bilaterally. Basilar widely patent. Superior cerebellar and posterior cerebral arteries patent bilaterally. Moderate stenosis proximal  PCA bilaterally. Venous sinuses: Normal venous enhancement Anatomic variants: None Review of the MIP images confirms the above findings CT Brain Perfusion Findings:  ASPECTS: 9 CBF (<30%) Volume: 0mL Perfusion (Tmax>6.0s) volume: 0mL Mismatch Volume: 0mL Infarction Location:None IMPRESSION: 1. CT perfusion negative for acute infarct or ischemia 2. CT head demonstrates hypodensity left occipital parietal lobe suggestive of subacute infarct. 3. Atherosclerotic calcification carotid bifurcation bilaterally without significant stenosis. Mild stenosis origin of right vertebral artery. Left vertebral artery widely patent 4. 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 Mild stenosis left MCA bifurcation 5. Moderate stenosis posterior cerebral artery bilaterally Electronically Signed   By: Marlan Palau M.D.   On: 03/15/2019 16:47   Ct Cervical Spine Wo Contrast  Result Date: 03/15/2019 CLINICAL DATA:  Status post fall. EXAM: CT HEAD WITHOUT CONTRAST CT CERVICAL SPINE WITHOUT CONTRAST TECHNIQUE: Multidetector CT imaging of the head and cervical spine was performed following the standard protocol without intravenous contrast. Multiplanar CT image reconstructions of the cervical spine were also generated. COMPARISON:  December 14, 2009. FINDINGS: CT HEAD FINDINGS Brain: Mild chronic ischemic white matter disease is noted. No mass effect or midline shift is noted. Ventricular size is within normal limits. There is no evidence of mass lesion, hemorrhage or acute infarction. Vascular: No hyperdense vessel or unexpected calcification. Skull: Normal. Negative for fracture or focal lesion. Sinuses/Orbits: No acute finding. Other: None. CT CERVICAL SPINE FINDINGS Alignment: Minimal grade 1 retrolisthesis of C4-5 is noted. Skull base and vertebrae: No acute fracture. No primary bone lesion or focal pathologic process. Soft tissues and spinal canal: No prevertebral fluid or swelling. No visible canal hematoma. Disc  levels: Moderate degenerative disc disease is noted at C3-4, C4-5 and C5-6. Upper chest: Negative. Other: Mild degenerative changes are seen involving posterior facet joints bilaterally. IMPRESSION: Mild chronic ischemic white matter disease. No acute intracranial abnormality seen. Moderate multilevel degenerative disc disease. No acute abnormality seen in the cervical spine. Electronically Signed   By: Lupita Raider M.D.   On: 03/15/2019 11:45   Mr Brain Wo Contrast  Result Date: 03/15/2019 CLINICAL DATA:  Altered level of consciousness.  Stroke. EXAM: MRI HEAD WITHOUT CONTRAST TECHNIQUE: Multiplanar, multiecho pulse sequences of the brain and surrounding structures were obtained without intravenous contrast. COMPARISON:  CT head and CTA head 03/15/2019 FINDINGS: Brain: Acute infarct left parietal lobe. Cortical infarct in the left posterior parietal lobe extending into the high left parietal lobe with numerous small areas of cortical infarct extending into the left frontal lobe. No other acute infarct. Negative for hemorrhage or mass. Chronic microvascular ischemic changes in the white matter. Ventricle size normal. Vascular: Normal arterial flow voids. Skull and upper cervical spine: Negative Sinuses/Orbits: Mild mucosal edema paranasal sinuses. Bilateral cataract surgery Other: None IMPRESSION: Acute infarct left posterior MCA territory without hemorrhage Chronic microvascular ischemic change in the white matter. Electronically Signed   By: Marlan Palau M.D.   On: 03/15/2019 19:32   Ct Hip Right Wo Contrast  Result Date: 03/15/2019 CLINICAL DATA:  Right hip pain.  Twisting injury. EXAM: CT OF THE RIGHT HIP WITHOUT CONTRAST TECHNIQUE: Multidetector CT imaging of the right hip was performed according to the standard protocol. Multiplanar CT image reconstructions were also generated. COMPARISON:  Radiographs, same date. FINDINGS: The right hip is normally located. Mild age related degenerative changes.  No acute fracture or evidence of AVN. Mild insertional spurring changes noted along the greater trochanter. The acetabulum is intact. The visualized right hemipelvis is intact. The right SI joint appears normal. No significant muscular abnormality.  No obvious tear or hematoma. No significant intrapelvic abnormalities are identified. Moderate vascular calcifications.  No inguinal mass or hernia. IMPRESSION: 1. Mild right hip joint degenerative changes but no fracture or AVN. 2. The visualized right hemipelvis is intact. Electronically Signed   By: Rudie Meyer M.D.   On: 03/15/2019 12:30   Ct Cerebral Perfusion W Contrast  Result Date: 03/15/2019 CLINICAL DATA:  Focal neuro deficit.  Rule out stroke.  Fall. EXAM: CT ANGIOGRAPHY HEAD AND NECK CT PERFUSION BRAIN TECHNIQUE: Multidetector CT imaging of the head and neck was performed using the standard protocol during bolus administration of intravenous contrast. Multiplanar CT image reconstructions and MIPs were obtained to evaluate the vascular anatomy. Carotid stenosis measurements (when applicable) are obtained utilizing NASCET criteria, using the distal internal carotid diameter as the denominator. Multiphase CT imaging of the brain was performed following IV bolus contrast injection. Subsequent parametric perfusion maps were calculated using RAPID software. CONTRAST:  OMNIPAQUE IOHEXOL 350 MG/ML SOLN COMPARISON:  CT head 03/15/2019 FINDINGS: CTA NECK FINDINGS Aortic arch: Standard branching. Imaged portion shows no evidence of aneurysm or dissection. No significant stenosis of the major arch vessel origins. Atherosclerotic calcification aortic arch and proximal great vessels. Right carotid system: Mild atherosclerotic calcification right carotid bifurcation without significant stenosis Left carotid system: Mild atherosclerotic calcification left carotid bifurcation without significant stenosis Vertebral arteries: Mild stenosis origin of right vertebral  artery. Right vertebral artery is patent to the basilar without additional stenosis Left vertebral artery widely patent the basilar without stenosis. Skeleton: Cervical spondylosis.  No acute skeletal abnormality. Other neck: Small thyroid nodules.  No soft tissue mass in the neck. Upper chest: Lung apices clear bilaterally. Review of the MIP images confirms the above findings CTA HEAD FINDINGS Anterior circulation: Extensive atherosclerotic calcification in the cavernous carotid bilaterally causing moderate stenosis bilaterally. Anterior and middle cerebral arteries patent bilaterally. Mild stenosis left MCA bifurcation. Posterior circulation: Both vertebral arteries patent to the basilar. PICA patent bilaterally. Basilar widely patent. Superior cerebellar and posterior cerebral arteries patent bilaterally. Moderate stenosis proximal PCA bilaterally. Venous sinuses: Normal venous enhancement Anatomic variants: None Review of the MIP images confirms the above findings CT Brain Perfusion Findings: ASPECTS: 9 CBF (<30%) Volume: 0mL Perfusion (Tmax>6.0s) volume: 0mL Mismatch Volume: 0mL Infarction Location:None IMPRESSION: 1. CT perfusion negative for acute infarct or ischemia 2. CT head demonstrates hypodensity left occipital parietal lobe suggestive of subacute infarct. 3. Atherosclerotic calcification carotid bifurcation bilaterally without significant stenosis. Mild stenosis origin of right vertebral artery. Left vertebral artery widely patent 4. 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 Mild stenosis left MCA bifurcation 5. Moderate stenosis posterior cerebral artery bilaterally Electronically Signed   By: Marlan Palau M.D.   On: 03/15/2019 16:47   Dg Hip Unilat With Pelvis 2-3 Views Right  Result Date: 03/15/2019 CLINICAL DATA:  Right hip pain secondary to a fall this morning. EXAM: DG HIP (WITH OR WITHOUT PELVIS) 2-3V RIGHT COMPARISON:  None. FINDINGS: On the AP view of the pelvis  there is suggestion of a hairline fracture of the right femoral neck. This is not visible on the other views in this could represent an overlying soft tissue fold. CT scan of the right hip recommended for further evaluation. IMPRESSION: 1. Possible hairline fracture of the right femoral neck. CT scan of the right hip recommended for further evaluation. 2. No other significant abnormalities of the right hip are seen. Electronically Signed   By: Francene Boyers M.D.   On: 03/15/2019 12:00   Ct Head Code Stroke Wo Contrast  Result Date: 03/15/2019 CLINICAL DATA:  Code stroke.  Stroke.  Fall. EXAM: CT HEAD WITHOUT CONTRAST TECHNIQUE: Contiguous axial images were obtained from the base of the skull through the vertex without intravenous contrast. COMPARISON:  CT head earlier today, proximally 5 hours previously FINDINGS: Brain: Ill-defined hypodensity left occipital parietal cortex is similar the prior study and most compatible with subacute infarct. Generalized atrophy. Bilateral white matter disease most compatible with chronic microvascular ischemia. Small chronic infarct right thalamus. Bilateral basal ganglia chronic small infarcts. Negative for hemorrhage or mass. Vascular: Negative for hyperdense vessel Skull: Negative Sinuses/Orbits: Mild mucosal edema paranasal sinuses. Bilateral ocular surgery. Other: None ASPECTS (Alberta Stroke Program Early CT Score) - Ganglionic level infarction (caudate, lentiform nuclei, internal capsule, insula, M1-M3 cortex): 6 - Supraganglionic infarction (M4-M6 cortex): 3 Total score (0-10 with 10 being normal): 9 IMPRESSION: 1. Hypodensity left occipital parietal cortex is unchanged from earlier today. The appearance is most consistent with subacute infarct. Recommend MRI to confirm. 2. Moderate to advanced chronic microvascular ischemic change. No acute hemorrhage 3. ASPECTS is 9 4. These results were called by telephone at the time of interpretation on 03/15/2019 at 4:35 pm to  provider [facility] , who verbally acknowledged these results. Electronically Signed   By: Marlan Palau M.D.   On: 03/15/2019 16:37   Vas [place] Lower Extremity Venous (dvt)  Result Date: 03/16/2019  Lower Venous Study Indications: Stroke, and embolic.  Comparison Study: No prior. Performing Technologist: Marilynne Halsted RDMS, RVT  Examination Guidelines: A complete evaluation includes B-mode imaging, spectral Doppler, color Doppler, and power Doppler as needed of all accessible portions of each vessel. Bilateral testing is considered an integral part of a complete examination. Limited examinations for reoccurring indications may be performed as noted.  +---------+---------------+---------+-----------+----------+--------------+  RIGHT     Compressibility Phasicity Spontaneity Properties Thrombus Aging  +---------+---------------+---------+-----------+----------+--------------+  CFV       Full            Yes       Yes                                    +---------+---------------+---------+-----------+----------+--------------+  SFJ       Full                                                             +---------+---------------+---------+-----------+----------+--------------+  FV Prox   Full                                                             +---------+---------------+---------+-----------+----------+--------------+  FV Mid    Full                                                             +---------+---------------+---------+-----------+----------+--------------+  FV Distal Full                                                             +---------+---------------+---------+-----------+----------+--------------+  PFV       Full                                                             +---------+---------------+---------+-----------+----------+--------------+  POP       Full            Yes       Yes                                     +---------+---------------+---------+-----------+----------+--------------+  PTV       Full                                                             +---------+---------------+---------+-----------+----------+--------------+  PERO      Full                                                             +---------+---------------+---------+-----------+----------+--------------+   +---------+---------------+---------+-----------+----------+--------------+  LEFT      Compressibility Phasicity Spontaneity Properties Thrombus Aging  +---------+---------------+---------+-----------+----------+--------------+  CFV       Full            Yes       Yes                                    +---------+---------------+---------+-----------+----------+--------------+  SFJ       Full                                                             +---------+---------------+---------+-----------+----------+--------------+  FV Prox   Full                                                             +---------+---------------+---------+-----------+----------+--------------+  FV Mid    Full                                                             +---------+---------------+---------+-----------+----------+--------------+  FV Distal Full                                                             +---------+---------------+---------+-----------+----------+--------------+  PFV       Full                                                             +---------+---------------+---------+-----------+----------+--------------+  POP       Full            Yes       Yes                                    +---------+---------------+---------+-----------+----------+--------------+  PTV       Full                                                             +---------+---------------+---------+-----------+----------+--------------+  PERO      Full                                                              +---------+---------------+---------+-----------+----------+--------------+     Summary: Right: There is no evidence of deep vein thrombosis in the lower extremity. No cystic structure found in the popliteal fossa. Left: There is no evidence of deep vein thrombosis in the lower extremity. No cystic structure found in the popliteal fossa.  *See table(s) above for measurements and observations. Electronically signed by Gretta Beganodd Early MD on 03/16/2019 at 4:35:43 PM.    Final     Assessment/Plan:     #1 history of ischemic CVA-she had been on aspirin and Plavix again she has completed her course of Plavix and continues on aspirin 81 mg a day-she is also on Keppra 500 mg twice daily for seizure prophylaxis-she has gained strength here but would benefit from continued PT and OT family has appealed to insurance company-if she is discharged she will need PT OT and ST she also will need a rolling walker. She also continues on Crestor  .  2.  History of hypertension she continues on Coreg 3.125 mg twice daily and Benicar 40 mg a day.  This appears to be stable-occasionally she will have a pulse in the 50s but appears to be asymptomatic-would be hesitant to change medications right before discharge since she has been stable clinically.  3.  History of type 2 diabetes she is on Ivokana--and CBGs have indicate stability.  4.  History of hyperlipidemia she is on Crestor 20 mg a day since her stay here was quite sure it was not aggressive pursuing a lipid panel this will need follow-up by primary care provider.  5.-History of minimally elevated calcium this will warrant follow-up by primary care provider it has shown stability at 10.3 which is minimal elevation she is not on any calcium supplementation.  6.  History of angioedema she continues on Zyrtec 10 mg a day.  She has been asymptomatic during her stay here  7.-History of GERD she is on Prilosec  20 mg a day I do not believe this is been an issue during her  stay here either  If she goes home she will be with her daughter who has been very supportive again and will need continued PT OT ST and a rolling walker secondary to continued weakness   CPT 99316-of note greater than 30 minutes spent on this discharge summary-greater than 50% of time spent coordinating a plan of care for numerous diagnoses     April 11, 2019 ADDENDUM------ patient has won her appeal and will be staying a few extra days at facility so she did not go home over the weekend-- I suspect she will need an updated discharge summary since she did not go home over the weekend

## 2019-04-12 ENCOUNTER — Non-Acute Institutional Stay (SKILLED_NURSING_FACILITY): Payer: Medicare HMO | Admitting: Internal Medicine

## 2019-04-12 ENCOUNTER — Encounter: Payer: Self-pay | Admitting: Internal Medicine

## 2019-04-12 DIAGNOSIS — I63512 Cerebral infarction due to unspecified occlusion or stenosis of left middle cerebral artery: Secondary | ICD-10-CM

## 2019-04-12 DIAGNOSIS — E785 Hyperlipidemia, unspecified: Secondary | ICD-10-CM

## 2019-04-12 DIAGNOSIS — I10 Essential (primary) hypertension: Secondary | ICD-10-CM

## 2019-04-12 DIAGNOSIS — K219 Gastro-esophageal reflux disease without esophagitis: Secondary | ICD-10-CM | POA: Diagnosis not present

## 2019-04-12 DIAGNOSIS — E1149 Type 2 diabetes mellitus with other diabetic neurological complication: Secondary | ICD-10-CM | POA: Diagnosis not present

## 2019-04-12 DIAGNOSIS — G40909 Epilepsy, unspecified, not intractable, without status epilepticus: Secondary | ICD-10-CM

## 2019-04-12 DIAGNOSIS — F32A Depression, unspecified: Secondary | ICD-10-CM

## 2019-04-12 DIAGNOSIS — F329 Major depressive disorder, single episode, unspecified: Secondary | ICD-10-CM

## 2019-04-12 NOTE — Progress Notes (Signed)
Location:  [facility] Room Number: 101-P Place of Service:  SNF [id number]  PCP: Margit Hanks, MD Patient Care Team: Margit Hanks, MD as PCP - General (Internal Medicine)  Extended Emergency Contact Information Primary Emergency Contact: Orlin Hilding Address: [address]          [place], [place] [address] [place] Mobile Phone: [phone] Relation: Daughter  Allergies  Allergen Reactions   Hydrocodone-Acetaminophen Palpitations   Pregabalin Palpitations   Valsartan Palpitations   Yellow Dye Itching    BJS:EGBTDV Dye+nisoldipine   Niacin Swelling   Acetaminophen     Reported by daughter    Interferons     Beta-1b   Nisoldipine    Losartan Palpitations    Chief Complaint  Patient presents with   Discharge Note    Patient is seen for discharge.     HPI:  73 y.o. female with diabetes, hypertension, depression, who presented to med [facility] for evaluation of TIA/stroke.  It was noted that her systolic blood pressure was in the 200s.  Neurology was called and recommended stat CTA and perfusion which showed a subacute infarct in left occipital parietal complex.  Patient was then transferred to [facility] for further evaluation and management.  Patient was admitted [facility] from 10/6-15 where the MRI of the brain showed an acute infarct of the left posterior MCA territoryWithout hemorrhage.  There were some chronic microvascular ischemic changes on the white matter.  Echo demonstrated EF of 60 to 65% with diastolic dysfunction and LVH.  Patient did complain of right-sided weakness.  Patient was started on aspirin and Plavix per neurology.  EEG demonstrated left parietal lobe activity and neurology started patient on Keppra for seizure precautions.  Patient was recommended to skilled nursing facility and now ready to be discharged to home    Past Medical History:  Diagnosis Date    Depression    Diabetes mellitus without complication (HCC)    Hypertension     Past Surgical History:  Procedure Laterality Date   ABDOMINAL HYSTERECTOMY     CATARACT EXTRACTION     EYE SURGERY     LOOP RECORDER INSERTION N/A 03/17/2019   Procedure: LOOP RECORDER INSERTION;  Surgeon: Hillis Range, MD;  Location: [facility] INVASIVE CV LAB;  Service: Cardiovascular;  Laterality: N/A;   TOE AMPUTATION       reports that she has quit smoking. She has never used smokeless tobacco. She reports current alcohol use. She reports that she does not use drugs. Social History   Socioeconomic History   Marital status: Single    Spouse name: Not on file   Number of children: Not on file   Years of education: Not on file   Highest education level: Not on file  Occupational History   Not on file  Social Needs   Financial resource strain: Not on file   Food insecurity    Worry: Not on file    Inability: Not on file   Transportation needs    Medical: Not on file    Non-medical: Not on file  Tobacco Use   Smoking status: Former Smoker   Smokeless tobacco: Never Used  Substance and Sexual Activity   Alcohol use: Yes    Comment: occ   Drug use: Never   Sexual activity: Not on file  Lifestyle   Physical activity    Days per week: Not on file    Minutes per  session: Not on file   Stress: Not on file  Relationships   Social connections    Talks on phone: Not on file    Gets together: Not on file    Attends religious service: Not on file    Active member of club or organization: Not on file    Attends meetings of clubs or organizations: Not on file    Relationship status: Not on file   Intimate partner violence    Fear of current or ex partner: Not on file    Emotionally abused: Not on file    Physically abused: Not on file    Forced sexual activity: Not on file  Other Topics Concern   Not on file  Social History Narrative   Former smoker.  Single.  Occasional  drinker.     Pertinent  Health Maintenance Due  Topic Date Due   FOOT EXAM  06/11/1955   OPHTHALMOLOGY EXAM  06/11/1955   MAMMOGRAM  06/11/1995   COLONOSCOPY  06/11/1995   DEXA SCAN  06/10/2010   PNA vac Low Risk Adult (1 of 2 - PCV13) 06/10/2010   HEMOGLOBIN A1C  09/13/2019   INFLUENZA VACCINE  Completed    Medications: Allergies as of 04/12/2019      Reactions   Hydrocodone-acetaminophen Palpitations   Pregabalin Palpitations   Valsartan Palpitations   Yellow Dye Itching   TKZ:SWFUXN Dye+nisoldipine   Niacin Swelling   Acetaminophen    Reported by daughter    Interferons    Beta-1b   Nisoldipine    Losartan Palpitations      Medication List       Accurate as of April 12, 2019 11:59 PM. If you have any questions, ask your nurse or doctor.        ARTIFICIAL TEARS OP Place 2 drops into both eyes daily.   aspirin 81 MG EC tablet Take 1 tablet (81 mg total) by mouth daily.   carvedilol 3.125 MG tablet Commonly known as: COREG Take 1 tablet (3.125 mg total) by mouth 2 (two) times daily.   cetirizine 10 MG tablet Commonly known as: ZYRTEC Take 1 tablet (10 mg total) by mouth daily. For angioedema   Invokana 100 MG Tabs tablet Generic drug: canagliflozin Take 1-1/2 tablets equal 150 mg p.o. daily before the first meal of the day   levETIRAcetam 500 MG tablet Commonly known as: KEPPRA Take 1 tablet (500 mg total) by mouth 2 (two) times daily.   olmesartan 40 MG tablet Commonly known as: BENICAR Take 1 tablet (40 mg total) by mouth daily.   omeprazole 20 MG capsule Commonly known as: PRILOSEC Take 1 capsule (20 mg total) by mouth daily. 30 minutes before morning meal   rosuvastatin 20 MG tablet Commonly known as: CRESTOR Take 1 tablet (20 mg total) by mouth daily.   sertraline 50 MG tablet Commonly known as: ZOLOFT Take 1 tablet (50 mg total) by mouth at bedtime.        Vitals:   04/12/19 1458  BP: 125/70  Pulse: 66  Resp: 18    Temp: 98 F (36.7 C)  TempSrc: Oral  Weight: 112 lb 12.8 oz (51.2 kg)  Height: 4\' 8"  (1.422 m)   Body mass index is 25.29 kg/m.  Physical Exam  GENERAL APPEARANCE: Alert, conversant. No acute distress.  HEENT: Unremarkable. RESPIRATORY: Breathing is even, unlabored. Lung sounds are clear   CARDIOVASCULAR: Heart RRR no murmurs, rubs or gallops. No peripheral edema.  GASTROINTESTINAL: Abdomen is soft,  non-tender, not distended w/ normal bowel sounds.  NEUROLOGIC: Cranial nerves 2-12 grossly intact. Moves all extremities with some right-sided weakness   Labs reviewed: Basic Metabolic Panel: Recent Labs    03/22/19 0354 03/23/19 0845 03/24/19 0913 03/31/19 04/07/19  NA 138 141 137 145 144  K 3.8 3.8 4.2 4.6 4.3  CL 107 111 110  --   --   CO2 20* 22 20*  --   --   GLUCOSE 142* 118* 189*  --   --   BUN 32* 26* 23 39* 28*  CREATININE 1.10* 0.97 0.93 1.1 0.8  CALCIUM 9.7 9.5 9.6  --   --   MG  --   --  1.8  --   --    No results found for: [facility] Liver Function Tests: No results for input(s): AST, ALT, ALKPHOS, BILITOT, PROT, ALBUMIN in the last 8760 hours. No results for input(s): LIPASE, AMYLASE in the last 8760 hours. No results for input(s): AMMONIA in the last 8760 hours. CBC: Recent Labs    03/15/19 1722 03/22/19 0354 03/24/19 0913 03/31/19  WBC 7.9 7.7 6.9 6.3  NEUTROABS  --  4.5 3.5 2  HGB 13.4 12.6 13.3 12.5  HCT 38.7 37.5 40.1 38  MCV 83.6 85.0 86.2  --   PLT 255 228 292 253   Lipid Recent Labs    03/16/19 0500  CHOL 216*  HDL 51  LDLCALC 144*  TRIG 106   Cardiac Enzymes: No results for input(s): CKTOTAL, CKMB, CKMBINDEX, TROPONINI in the last 8760 hours. BNP: No results for input(s): BNP in the last 8760 hours. CBG: Recent Labs    03/23/19 1607 03/23/19 2135 03/24/19 1139  GLUCAP 117* 117* 109*    Procedures and Imaging Studies During Stay: Vas [place] Lower Extremity Venous (dvt)  Result Date: 03/16/2019  Lower Venous Study  Indications: Stroke, and embolic.  Comparison Study: No prior. Performing Technologist: Marilynne Halsted RDMS, RVT  Examination Guidelines: A complete evaluation includes B-mode imaging, spectral Doppler, color Doppler, and power Doppler as needed of all accessible portions of each vessel. Bilateral testing is considered an integral part of a complete examination. Limited examinations for reoccurring indications may be performed as noted.  +---------+---------------+---------+-----------+----------+--------------+  RIGHT     Compressibility Phasicity Spontaneity Properties Thrombus Aging  +---------+---------------+---------+-----------+----------+--------------+  CFV       Full            Yes       Yes                                    +---------+---------------+---------+-----------+----------+--------------+  SFJ       Full                                                             +---------+---------------+---------+-----------+----------+--------------+  FV Prox   Full                                                             +---------+---------------+---------+-----------+----------+--------------+  FV Mid  Full                                                             +---------+---------------+---------+-----------+----------+--------------+  FV Distal Full                                                             +---------+---------------+---------+-----------+----------+--------------+  PFV       Full                                                             +---------+---------------+---------+-----------+----------+--------------+  POP       Full            Yes       Yes                                    +---------+---------------+---------+-----------+----------+--------------+  PTV       Full                                                             +---------+---------------+---------+-----------+----------+--------------+  PERO      Full                                                              +---------+---------------+---------+-----------+----------+--------------+   +---------+---------------+---------+-----------+----------+--------------+  LEFT      Compressibility Phasicity Spontaneity Properties Thrombus Aging  +---------+---------------+---------+-----------+----------+--------------+  CFV       Full            Yes       Yes                                    +---------+---------------+---------+-----------+----------+--------------+  SFJ       Full                                                             +---------+---------------+---------+-----------+----------+--------------+  FV Prox   Full                                                             +---------+---------------+---------+-----------+----------+--------------+  FV Mid    Full                                                             +---------+---------------+---------+-----------+----------+--------------+  FV Distal Full                                                             +---------+---------------+---------+-----------+----------+--------------+  PFV       Full                                                             +---------+---------------+---------+-----------+----------+--------------+  POP       Full            Yes       Yes                                    +---------+---------------+---------+-----------+----------+--------------+  PTV       Full                                                             +---------+---------------+---------+-----------+----------+--------------+  PERO      Full                                                             +---------+---------------+---------+-----------+----------+--------------+     Summary: Right: There is no evidence of deep vein thrombosis in the lower extremity. No cystic structure found in the popliteal fossa. Left: There is no evidence of deep vein thrombosis in the lower extremity. No cystic structure found in the popliteal  fossa.  *See table(s) above for measurements and observations. Electronically signed by Gretta Began MD on 03/16/2019 at 4:35:43 PM.    Final     Assessment/Plan:   Acute ischemic left middle cerebral artery (MCA) stroke (HCC)  Essential hypertension  Gastroesophageal reflux disease without esophagitis  Type 2 diabetes mellitus with neurological complications (HCC)  Seizure disorder (HCC)  Depression, unspecified depression type  Hyperlipidemia, unspecified hyperlipidemia type   Patient is being discharged with the following home health services: None OT/PT/ST  Patient is being discharged with the following durable medical equipment: Rolling walker  Patient has been advised to f/u with their PCP in 1-2 weeks to bring them up to date on their rehab stay.  Social services at facility was responsible for arranging this appointment.  Pt was provided with a 30 day supply of prescriptions for medications and refills  must be obtained from their PCP.  For controlled substances, a more limited supply may be provided adequate until PCP appointment only.   Time spent greater than 35 minutes;> 50% of time with patient was spent reviewing records, labs, tests and studies, counseling and developing plan of care  Margit HanksAnne D Kambri Dismore MD

## 2019-04-14 DIAGNOSIS — I69351 Hemiplegia and hemiparesis following cerebral infarction affecting right dominant side: Secondary | ICD-10-CM | POA: Diagnosis not present

## 2019-04-14 DIAGNOSIS — I69320 Aphasia following cerebral infarction: Secondary | ICD-10-CM | POA: Diagnosis not present

## 2019-04-14 DIAGNOSIS — E119 Type 2 diabetes mellitus without complications: Secondary | ICD-10-CM | POA: Diagnosis not present

## 2019-04-14 DIAGNOSIS — I10 Essential (primary) hypertension: Secondary | ICD-10-CM | POA: Diagnosis not present

## 2019-04-14 DIAGNOSIS — K219 Gastro-esophageal reflux disease without esophagitis: Secondary | ICD-10-CM

## 2019-04-14 DIAGNOSIS — F329 Major depressive disorder, single episode, unspecified: Secondary | ICD-10-CM

## 2019-04-14 DIAGNOSIS — E785 Hyperlipidemia, unspecified: Secondary | ICD-10-CM

## 2019-04-14 DIAGNOSIS — G40909 Epilepsy, unspecified, not intractable, without status epilepticus: Secondary | ICD-10-CM

## 2019-04-15 ENCOUNTER — Encounter: Payer: Self-pay | Admitting: Internal Medicine

## 2019-04-28 ENCOUNTER — Ambulatory Visit (INDEPENDENT_AMBULATORY_CARE_PROVIDER_SITE_OTHER): Payer: Medicare HMO | Admitting: Adult Health

## 2019-04-28 ENCOUNTER — Other Ambulatory Visit: Payer: Self-pay

## 2019-04-28 ENCOUNTER — Encounter: Payer: Self-pay | Admitting: Adult Health

## 2019-04-28 VITALS — BP 138/62 | HR 53 | Temp 97.0°F | Ht <= 58 in | Wt 110.0 lb

## 2019-04-28 DIAGNOSIS — E785 Hyperlipidemia, unspecified: Secondary | ICD-10-CM

## 2019-04-28 DIAGNOSIS — I69328 Other speech and language deficits following cerebral infarction: Secondary | ICD-10-CM

## 2019-04-28 DIAGNOSIS — I10 Essential (primary) hypertension: Secondary | ICD-10-CM

## 2019-04-28 DIAGNOSIS — I63512 Cerebral infarction due to unspecified occlusion or stenosis of left middle cerebral artery: Secondary | ICD-10-CM | POA: Diagnosis not present

## 2019-04-28 DIAGNOSIS — I69359 Hemiplegia and hemiparesis following cerebral infarction affecting unspecified side: Secondary | ICD-10-CM

## 2019-04-28 DIAGNOSIS — E1159 Type 2 diabetes mellitus with other circulatory complications: Secondary | ICD-10-CM

## 2019-04-28 MED ORDER — LEVETIRACETAM 500 MG PO TABS: 500.0000 mg | ORAL_TABLET | Freq: Two times a day (BID) | ORAL | 3 refills | Status: DC

## 2019-04-28 NOTE — Progress Notes (Signed)
[facility] [address] [place]. 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  History obtained from patient, daughter and chart review. Reviewed all radiology images and labs personally.  Ms. [name] is a 73 y.o. female with history of HTN, HLD, DM, depression presented on 03/15/2019 to [facility] following a presumed fall who developed mixed aphasia and SBP 200s while awaiting admission.  She was transferred to [facility] for further work-up.  Stroke work-up revealed left posterior MCA infarct embolic as evidenced on MRI secondary to unknown source concerning for occult A. fib.  CTA showed hypodensity left occipital parietal lobe, bilateral ICA bifurcation arthrosclerosis, bilateral cavernous moderate stenosis, left MCA bifurcation mild stenosis and right VA origin with mild stenosis.  CT perfusion negative.  In addition to acute infarct, MRI also showed evidence of small vessel disease.  LE Doppler negative DVT.  2D echo normal EF without cardiac source of embolus identified.  Loop recorder placed to rule out atrial fibrillation.  EEG showed left temporal sharps likely due to left MCA infarct and initiated Keppra 500 mg twice daily for seizure prophylaxis.  LDL 144.  A1c 7.5.  Recommended DAPT for 3 weeks and aspirin alone.  HTN stabilized and recommended long-term BP goal normotensive range.  Previously on Crestor and change to atorvastatin 40 mg daily due to continued elevated LDL.  Uncontrolled DM and recommend close PCP  follow-up.  Other stroke risk factors include advanced age and EtOH use but no prior history of stroke.  Therapies recommended CIR but was discharged to SNF due to family wishes for ongoing therapy with residual right hemiparesis, facial droop and speech difficulty.  Rebecca Gallagher is a 73 year old female who is being seen today for stroke follow-up.  She has since been discharged from SNF and has returned home on 04/12/2019.  She has made great improvements with residual mild decreased right hand dexterity and hip flexor weakness and mild dysarthria.  She has been working with home health physical therapy and potentially occupational therapy.  Evaluated by speech therapy who did not feel as though speech therapy indicated.  She continues to ambulate with rolling walker without any recent falls.  Continues to reside with her daughter.  Currently on sertraline 50 mg daily with depression stable.  Completed 3 weeks DAPT and continues on aspirin alone without bleeding or bruising.  Continues on Crestor 20 mg daily without myalgias.  Blood pressure today 130/62.  She has continued on Keppra 500 mg twice daily without seizure activity or side effects.  Daughter questions need of ongoing use of Keppra.  Loop recorder in place but unfortunately missed cardiology follow-up visit as daughter was not aware of appointment.  No reports on loop recorder at this time.  No further concerns at this time.       ROS:   14 system review of systems performed and negative with exception of murmur, ringing in ears, snoring, allergies, runny nose, weakness and change in appetite  PMH:  Past Medical  History:  Diagnosis Date   Depression    Diabetes mellitus without complication (HCC)    High cholesterol    Hypertension    Seizures (HCC)    Stroke (HCC)     PSH:  Past Surgical History:  Procedure Laterality Date   ABDOMINAL HYSTERECTOMY     CATARACT EXTRACTION     EYE SURGERY     LOOP RECORDER INSERTION N/A  03/17/2019   Procedure: LOOP RECORDER INSERTION;  Surgeon: Hillis RangeAllred, James, MD;  Location: [facility]AS[facility];  Service: Cardiovascular;  Laterality: N/A;   TOE AMPUTATION      Social History:  Social History   Socioeconomic History   Marital status: Single    Spouse name: Not on file   Number of children: 3   Years of education: Not on file   Highest education level: GED or equivalent  Occupational History   Not on file  Social Needs   Financial resource strain: Not on file   Food insecurity    Worry: Not on file    Inability: Not on file   Transportation needs    Medical: Not on file    Non-medical: Not on file  Tobacco Use   Smoking status: Former Smoker    Quit date: 04/27/1969    Years since quitting: 50.0   Smokeless tobacco: Never Used  Substance and Sexual Activity   Alcohol use: Yes    Comment: occ   Drug use: Never   Sexual activity: Not on file  Lifestyle   Physical activity    Days per week: Not on file    Minutes per session: Not on file   Stress: Not on file  Relationships   Social connections    Talks on phone: Not on file    Gets together: Not on file    Attends religious service: Not on file    Active member of club or organization: Not on file    Attends meetings of clubs or organizations: Not on file    Relationship status: Not on file   Intimate partner violence    Fear of current or ex partner: Not on file    Emotionally abused: Not on file    Physically abused: Not on file    Forced sexual activity: Not on file  Other Topics Concern   Not on file  Social History Narrative   04/28/19 living with dgtr   Former smoker.  Single.  Occasional drinker.    Coffee 1 c daily    Family History:  Family History  Problem Relation Age of Onset   Heart failure Mother    Hypertension Mother    Stroke Mother    Heart attack Father    Hypertension Father    Hypertension Sister    Hypertension Brother    Stroke Brother      Heart attack Brother     Medications:   Current Outpatient Medications on File Prior to Visit  Medication Sig Dispense Refill   aspirin EC 81 MG tablet Take 81 mg by mouth daily.     carvedilol (COREG) 3.125 MG tablet Take 1 tablet (3.125 mg total) by mouth 2 (two) times daily. 60 tablet 0   cetirizine (ZYRTEC) 10 MG tablet Take 1 tablet (10 mg total) by mouth daily. For angioedema 30 tablet 0   INVOKANA 100 MG TABS tablet Take 1-1/2 tablets equal 150 mg p.o. daily before the first meal of the day 45 tablet 0   olmesartan (BENICAR)  40 MG tablet Take 1 tablet (40 mg total) by mouth daily. 30 tablet 0   omeprazole (PRILOSEC) 20 MG capsule Take 1 capsule (20 mg total) by mouth daily. 30 minutes before morning meal 30 capsule 0   rosuvastatin (CRESTOR) 20 MG tablet Take 1 tablet (20 mg total) by mouth daily. 30 tablet 0   sertraline (ZOLOFT) 50 MG tablet Take 1 tablet (50 mg total) by mouth at bedtime. 30 tablet 0   Carboxymethylcellulose Sodium (ARTIFICIAL TEARS OP) Place 2 drops into both eyes daily.     No current facility-administered medications on file prior to visit.     Allergies:   Allergies  Allergen Reactions   Hydrocodone-Acetaminophen Palpitations   Pregabalin Palpitations   Valsartan Palpitations   Yellow Dye Itching    ZOX:WRUEAV Dye+nisoldipine   Niacin Swelling   Acetaminophen     Reported by daughter    Interferons     Beta-1b   Nisoldipine    Losartan Palpitations     Physical Exam  Vitals:   04/28/19 0754  BP: 138/62  Pulse: (!) 53  Temp: (!) 97 F (36.1 C)  Weight: 110 lb (49.9 kg)  Height:  (1.422 m)   Body mass index is 24.66 kg/m. No exam data present  Depression screen [facility] 2/9 04/28/2019  Decreased Interest 0  Down, Depressed, Hopeless 1  PHQ - 2 Score 1  Altered sleeping 0  Tired, decreased energy 1  Change in appetite 0  Feeling bad or failure about yourself  0  Trouble concentrating 1  Moving slowly or  fidgety/restless 0  Suicidal thoughts 0  PHQ-9 Score 3  Difficult doing work/chores Not difficult at all     General: well developed, well nourished,  pleasant elderly Caucasian female, seated, in no evident distress Head: head normocephalic and atraumatic.   Neck: supple with no carotid or supraclavicular bruits Cardiovascular: regular rate and rhythm, no murmurs Musculoskeletal: no deformity Skin:  no rash/petichiae Vascular:  Normal pulses all extremities   Neurologic Exam Mental Status: Awake and fully alert.   Very mild dysarthria with occasional speech hesitancy.  Oriented to place and time. Recent and remote memory intact with only occasional difficulty. Attention span, concentration and fund of knowledge appropriate. Mood and affect appropriate.  Cranial Nerves: Fundoscopic exam reveals sharp disc margins. Pupils equal, briskly reactive to light. Extraocular movements full without nystagmus. Visual fields full to confrontation. Hearing intact. Facial sensation intact.  Mild right lower facial weakness Motor: Normal bulk and tone. RUE: 5/5 with decreased finger dexterity RLE: 4/5 hip flexor but 5/5 with knee extension/flexion and ankle dorsiflexion/plantarflexion Sensory.: intact to touch , pinprick , position and vibratory sensation.  Coordination: Rapid alternating movements normal in all extremities except mildly decreased right finger dexterity. Finger-to-nose shows mild right hand ataxia and heel-to-shin performed accurately bilaterally. Gait and Station: Arises from chair without difficulty. Stance is normal. Gait demonstrates normal stride length and balance with use of rolling walker Reflexes: 1+ and symmetric. Toes downgoing.     NIHSS  3 Modified Rankin  3    Diagnostic Data (Labs, Imaging, Testing)  CT HEAD WO CONTRAST 03/15/2019 IMPRESSION: 1. Hypodensity left occipital parietal cortex is unchanged from earlier today. The appearance is most consistent with  subacute infarct. Recommend MRI to confirm. 2. Moderate to advanced chronic microvascular ischemic change. No acute hemorrhage 3. ASPECTS is 9  CT ANGIO HEAD W OR WO CONTRAST CT ANGIO NECK W OR WO CONTRAST CT CEREBRAL PERFUSION W CONTRAST  03/15/2019 IMPRESSION: 1. CT perfusion negative for acute infarct or ischemia 2. CT head demonstrates hypodensity left occipital parietal lobe suggestive of subacute infarct. 3. Atherosclerotic calcification carotid bifurcation bilaterally without significant stenosis. Mild stenosis origin of right vertebral artery. Left vertebral artery widely patent 4. 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 Mild stenosis left MCA bifurcation 5. Moderate stenosis posterior cerebral artery bilaterally  MR BRAIN WO CONTRAST 03/15/2019 IMPRESSION: Acute infarct left posterior MCA territory without hemorrhage Chronic microvascular ischemic change in the white matter.  ECHOCARDIOGRAM 03/16/2019 IMPRESSIONS  1. Left ventricular ejection fraction, by visual estimation, is 60 to 65%. The left ventricle has normal function. Normal left ventricular size. Left ventricular septal wall thickness was moderately increased. There is moderately increased left  ventricular hypertrophy.  2. Left ventricular diastolic Doppler parameters are consistent with impaired relaxation pattern of LV diastolic filling.  3. Global right ventricle has normal systolic function.The right ventricular size is normal. No increase in right ventricular wall thickness.  4. Left atrial size was normal.  5. Right atrial size was normal.  6. Moderate calcification of the mitral valve leaflet(s).  7. Moderate mitral annular calcification.  8. Moderate thickening of the mitral valve leaflet(s).  9. The mitral valve is normal in structure. Mild mitral valve regurgitation. No evidence of mitral stenosis. 10. The tricuspid valve is normal in structure. Tricuspid valve regurgitation  was not visualized by color flow Doppler. 11. The aortic valve is tricuspid Aortic valve regurgitation is mild by color flow Doppler. Moderate aortic valve stenosis. 12. The pulmonic valve was normal in structure. Pulmonic valve regurgitation is not visualized by color flow Doppler. 13. The inferior vena cava is normal in size with greater than 50% respiratory variability, suggesting right atrial pressure of 3 mmHg.  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 No seizures  were seen throughout the recording.       ASSESSMENT: Berdena Cisek is a 73 y.o. year old female presented after a fall who developed mixed aphasia and elevated BP on 03/15/2019 with stroke work-up revealing left posterior MCA infarct embolic secondary to unknown source concerning for occult A. fib therefore loop recorder placed. Vascular risk factors include HTN, HLD, DM, intracranial stenosis and abnormal EEG.  Has made great improvement since discharge with residual mild decreased right hand dexterity, right hip flexor weakness and mild dysarthria    PLAN:  1. Left MCA stroke: Continue aspirin 81 mg daily  and atorvastatin for secondary stroke prevention. Maintain strict control of hypertension with blood pressure goal below 130/90, diabetes with hemoglobin A1c goal below 6.5% and cholesterol with LDL cholesterol (bad cholesterol) goal below 70 mg/dL.  I also advised the patient to eat a healthy diet with plenty of whole grains, cereals, fruits and vegetables, exercise regularly with at least 30 minutes of continuous activity daily and maintain ideal body weight.  Advised to follow-up with cardiology with office number provided regarding loop recorder for long-term monitoring for potential atrial fibrillation 2. HTN: Advised to continue current treatment regimen.  Today's BP 138/62.  Advised to continue to monitor at home along with continued follow-up with  PCP for management 3. HLD: Advised to continue current treatment regimen along with continued follow-up with PCP for future prescribing and monitoring of lipid panel 4. DMII: Advised to continue to monitor glucose levels at home along with continued follow-up with PCP for management and monitoring 5. Abnormal EEG: Discussion regarding high risk of possible post stroke  seizure activity due to area of stroke.  Discussion regarding abnormal EEG during admission and reasoning for initiating Keppra.  Recommend continuation of Keppra at this time and will discuss repeat EEG at follow-up visit with potentially tapering/discontinuing Keppra 6. Mild right-sided weakness and dysarthria: Continue to work with home health therapies with potential transition to outpatient therapies once completed.  Daughter is unsure regarding Occupational Therapy and advised to contact advanced home care in regards to need of occupational therapy.  Discussed exercises to do on her own regarding residual mild speech deficits 7. Discussed potential participation in [place] trial due to cryptogenic stroke the patient and daughter declined interest at this time due to feeling overwhelmed by therapy and doctor appointments.  Advised to call office if interested over the next 1 to 2 months for potential participation    Follow up in 3 months or call earlier if needed   Greater than 50% of time during this 45 minute visit was spent on counseling, explanation of diagnosis of left MCA stroke, reviewing risk factor management of HTN, HLD and DM, discussion regarding potential seizure activity with abnormal EEG, planning of further management along with potential future management, and discussion with patient and family answering all questions.    Ihor Austin, AGNP-BC  [facility] [address] [place], [place] [address]  Phone [phone] Fax [phone] Note: This document was prepared with  digital dictation and possible smart phrase technology. Any transcriptional errors that result from this process are unintentional.

## 2019-04-28 NOTE — Patient Instructions (Addendum)
Continue to work with home health therapies for ongoing improvement with potential transition to outpatient therapies once completed.  Please call office if you need further assistance regarding occupational therapy  Continue aspirin 81 mg daily  and Crestor 20 mg daily for secondary stroke prevention  Continue Keppra 500 mg daily for seizure prevention  Continue to follow up with PCP regarding cholesterol, blood pressure and diabetes management   Please reschedule cardiology follow-up regarding your loop recorder Office number [phone]  Continue to monitor blood pressure at home  Maintain strict control of hypertension with blood pressure goal below 130/90, diabetes with hemoglobin A1c goal below 6.5% and cholesterol with LDL cholesterol (bad cholesterol) goal below 70 mg/dL. I also advised the patient to eat a healthy diet with plenty of whole grains, cereals, fruits and vegetables, exercise regularly and maintain ideal body weight.  Followup in the future with me in 3 months or call earlier if needed       Thank you for coming to see [place] at [facility]. I hope we have been able to provide you high quality care today.  You may receive a patient satisfaction survey over the next few weeks. We would appreciate your feedback and comments so that we may continue to improve ourselves and the health of our patients.

## 2019-04-29 ENCOUNTER — Telehealth: Payer: Self-pay | Admitting: Emergency Medicine

## 2019-04-29 ENCOUNTER — Telehealth: Payer: Self-pay | Admitting: Internal Medicine

## 2019-04-29 NOTE — Progress Notes (Signed)
I agree with the above plan 

## 2019-04-29 NOTE — Telephone Encounter (Signed)
Daughter stated  "Pam" in our office asked her to send picture of LINQ II implant site and then she would not have to come into office for visit. She missed her wound check because she was in a rehab facility at the time. Arranged for picture to be sent and remote transmission  in system for 05/02/19.

## 2019-04-29 NOTE — Telephone Encounter (Signed)
Loop wound site well healed. No redness , drainage or edema. Will call if she develops any signs of infection.

## 2019-04-29 NOTE — Telephone Encounter (Signed)
New message:    Patient states that someone told her to take a picture of the device and doesn't know where or how to send.

## 2019-05-01 LAB — CUP PACEART REMOTE DEVICE CHECK
Date Time Interrogation Session: 20201121115912
Implantable Pulse Generator Implant Date: 20201008

## 2019-05-02 ENCOUNTER — Ambulatory Visit (INDEPENDENT_AMBULATORY_CARE_PROVIDER_SITE_OTHER): Payer: Medicare HMO | Admitting: *Deleted

## 2019-05-02 DIAGNOSIS — I63512 Cerebral infarction due to unspecified occlusion or stenosis of left middle cerebral artery: Secondary | ICD-10-CM | POA: Diagnosis not present

## 2019-05-04 ENCOUNTER — Observation Stay (HOSPITAL_COMMUNITY)
Admission: EM | Admit: 2019-05-04 | Discharge: 2019-05-05 | Disposition: A | Discharge status: Home / Self Care | Payer: Medicare HMO | Attending: Internal Medicine | Admitting: Internal Medicine

## 2019-05-04 ENCOUNTER — Encounter (HOSPITAL_COMMUNITY): Payer: Self-pay | Admitting: Family Medicine

## 2019-05-04 ENCOUNTER — Observation Stay (HOSPITAL_COMMUNITY): Payer: Medicare HMO

## 2019-05-04 ENCOUNTER — Emergency Department (HOSPITAL_COMMUNITY): Payer: Medicare HMO

## 2019-05-04 DIAGNOSIS — Z885 Allergy status to narcotic agent status: Secondary | ICD-10-CM | POA: Insufficient documentation

## 2019-05-04 DIAGNOSIS — Z79899 Other long term (current) drug therapy: Secondary | ICD-10-CM | POA: Insufficient documentation

## 2019-05-04 DIAGNOSIS — I161 Hypertensive emergency: Secondary | ICD-10-CM | POA: Diagnosis not present

## 2019-05-04 DIAGNOSIS — E876 Hypokalemia: Secondary | ICD-10-CM | POA: Diagnosis not present

## 2019-05-04 DIAGNOSIS — I10 Essential (primary) hypertension: Secondary | ICD-10-CM | POA: Diagnosis not present

## 2019-05-04 DIAGNOSIS — E1149 Type 2 diabetes mellitus with other diabetic neurological complication: Secondary | ICD-10-CM | POA: Insufficient documentation

## 2019-05-04 DIAGNOSIS — E785 Hyperlipidemia, unspecified: Secondary | ICD-10-CM | POA: Diagnosis not present

## 2019-05-04 DIAGNOSIS — Z7982 Long term (current) use of aspirin: Secondary | ICD-10-CM | POA: Diagnosis not present

## 2019-05-04 DIAGNOSIS — Z20828 Contact with and (suspected) exposure to other viral communicable diseases: Secondary | ICD-10-CM | POA: Insufficient documentation

## 2019-05-04 DIAGNOSIS — G459 Transient cerebral ischemic attack, unspecified: Secondary | ICD-10-CM | POA: Diagnosis present

## 2019-05-04 DIAGNOSIS — Z886 Allergy status to analgesic agent status: Secondary | ICD-10-CM | POA: Insufficient documentation

## 2019-05-04 DIAGNOSIS — Z888 Allergy status to other drugs, medicaments and biological substances status: Secondary | ICD-10-CM | POA: Diagnosis not present

## 2019-05-04 DIAGNOSIS — I674 Hypertensive encephalopathy: Secondary | ICD-10-CM | POA: Insufficient documentation

## 2019-05-04 DIAGNOSIS — I69351 Hemiplegia and hemiparesis following cerebral infarction affecting right dominant side: Secondary | ICD-10-CM | POA: Insufficient documentation

## 2019-05-04 DIAGNOSIS — F32A Depression, unspecified: Secondary | ICD-10-CM | POA: Diagnosis present

## 2019-05-04 DIAGNOSIS — K219 Gastro-esophageal reflux disease without esophagitis: Secondary | ICD-10-CM | POA: Insufficient documentation

## 2019-05-04 DIAGNOSIS — G40909 Epilepsy, unspecified, not intractable, without status epilepticus: Secondary | ICD-10-CM | POA: Insufficient documentation

## 2019-05-04 DIAGNOSIS — I160 Hypertensive urgency: Secondary | ICD-10-CM | POA: Diagnosis present

## 2019-05-04 DIAGNOSIS — Z7984 Long term (current) use of oral hypoglycemic drugs: Secondary | ICD-10-CM | POA: Insufficient documentation

## 2019-05-04 DIAGNOSIS — R531 Weakness: Secondary | ICD-10-CM

## 2019-05-04 DIAGNOSIS — F329 Major depressive disorder, single episode, unspecified: Secondary | ICD-10-CM | POA: Diagnosis not present

## 2019-05-04 DIAGNOSIS — Z87891 Personal history of nicotine dependence: Secondary | ICD-10-CM | POA: Insufficient documentation

## 2019-05-04 DIAGNOSIS — R4781 Slurred speech: Secondary | ICD-10-CM | POA: Insufficient documentation

## 2019-05-04 DIAGNOSIS — E7800 Pure hypercholesterolemia, unspecified: Secondary | ICD-10-CM | POA: Insufficient documentation

## 2019-05-04 LAB — URINALYSIS, ROUTINE W REFLEX MICROSCOPIC
Bacteria, UA: NONE SEEN
Bilirubin Urine: NEGATIVE
Glucose, UA: >=500 mg/dL — AB
Ketones, ur: 20 mg/dL — AB
Leukocytes,Ua: NEGATIVE
Nitrite: NEGATIVE
Protein, ur: NEGATIVE mg/dL
Specific Gravity, Urine: 1.01 (ref 1.005–1.030)
pH: 6 (ref 5.0–8.0)

## 2019-05-04 LAB — CBC WITH DIFFERENTIAL/PLATELET
Abs Immature Granulocytes: 0.01 10*3/uL (ref 0.00–0.07)
Basophils Absolute: 0.1 10*3/uL (ref 0.0–0.1)
Basophils Relative: 1 %
Eosinophils Absolute: 0.4 10*3/uL (ref 0.0–0.5)
Eosinophils Relative: 6 %
HCT: 41.6 % (ref 36.0–46.0)
Hemoglobin: 14.4 g/dL (ref 12.0–15.0)
Immature Granulocytes: 0 %
Lymphocytes Relative: 35 %
Lymphs Abs: 2.5 10*3/uL (ref 0.7–4.0)
MCH: 28.2 pg (ref 26.0–34.0)
MCHC: 34.6 g/dL (ref 30.0–36.0)
MCV: 81.6 fL (ref 80.0–100.0)
Monocytes Absolute: 0.8 10*3/uL (ref 0.1–1.0)
Monocytes Relative: 12 %
Neutro Abs: 3.2 10*3/uL (ref 1.7–7.7)
Neutrophils Relative %: 46 %
Platelets: 245 10*3/uL (ref 150–400)
RBC: 5.1 MIL/uL (ref 3.87–5.11)
RDW: 13 % (ref 11.5–15.5)
WBC: 7 10*3/uL (ref 4.0–10.5)
nRBC: 0 % (ref 0.0–0.2)

## 2019-05-04 LAB — COMPREHENSIVE METABOLIC PANEL
ALT: 25 U/L (ref 0–44)
AST: 33 U/L (ref 15–41)
Albumin: 4 g/dL (ref 3.5–5.0)
Alkaline Phosphatase: 87 U/L (ref 38–126)
Anion gap: 18 — ABNORMAL HIGH (ref 5–15)
BUN: 10 mg/dL (ref 8–23)
CO2: 24 mmol/L (ref 22–32)
Calcium: 10.1 mg/dL (ref 8.9–10.3)
Chloride: 102 mmol/L (ref 98–111)
Creatinine, Ser: 1.17 mg/dL — ABNORMAL HIGH (ref 0.44–1.00)
GFR calc Af Amer: 54 mL/min — ABNORMAL LOW (ref >60)
GFR calc non Af Amer: 46 mL/min — ABNORMAL LOW (ref >60)
Glucose, Bld: 125 mg/dL — ABNORMAL HIGH (ref 70–99)
Potassium: 2.6 mmol/L — CL (ref 3.5–5.1)
Sodium: 144 mmol/L (ref 135–145)
Total Bilirubin: 0.9 mg/dL (ref 0.3–1.2)
Total Protein: 6.5 g/dL (ref 6.5–8.1)

## 2019-05-04 LAB — RAPID URINE DRUG SCREEN, HOSP PERFORMED
Amphetamines: NOT DETECTED
Barbiturates: NOT DETECTED
Benzodiazepines: NOT DETECTED
Cocaine: NOT DETECTED
Opiates: NOT DETECTED
Tetrahydrocannabinol: NOT DETECTED

## 2019-05-04 LAB — PROTIME-INR
INR: 1 (ref 0.8–1.2)
INR: 1.1 (ref 0.8–1.2)
Prothrombin Time: 13 seconds (ref 11.4–15.2)
Prothrombin Time: 13.6 seconds (ref 11.4–15.2)

## 2019-05-04 LAB — TROPONIN I (HIGH SENSITIVITY): Troponin I (High Sensitivity): 165 ng/L (ref <18)

## 2019-05-04 LAB — APTT: aPTT: 26 seconds (ref 24–36)

## 2019-05-04 LAB — MAGNESIUM: Magnesium: 1.8 mg/dL (ref 1.7–2.4)

## 2019-05-04 LAB — CBG MONITORING, ED: Glucose-Capillary: 123 mg/dL — ABNORMAL HIGH (ref 70–99)

## 2019-05-04 LAB — GLUCOSE, CAPILLARY: Glucose-Capillary: 91 mg/dL (ref 70–99)

## 2019-05-04 MED ORDER — AMLODIPINE BESYLATE 2.5 MG PO TABS
2.5000 mg | ORAL_TABLET | Freq: Every day | ORAL | Status: DC
  Administered 2019-05-04 – 2019-05-05 (×2): 2.5 mg via ORAL
  Filled 2019-05-04 (×2): qty 1

## 2019-05-04 MED ORDER — STROKE: EARLY STAGES OF RECOVERY BOOK
Freq: Once | Status: AC
  Administered 2019-05-04
  Filled 2019-05-04: qty 1

## 2019-05-04 MED ORDER — IRBESARTAN 300 MG PO TABS
300.0000 mg | ORAL_TABLET | Freq: Every day | ORAL | Status: DC
  Administered 2019-05-05: 300 mg via ORAL
  Filled 2019-05-04: qty 1

## 2019-05-04 MED ORDER — ACETAMINOPHEN 160 MG/5ML PO SOLN: 650.0000 mg | ORAL | Status: DC | PRN

## 2019-05-04 MED ORDER — ACETAMINOPHEN 325 MG PO TABS: 650.0000 mg | ORAL_TABLET | ORAL | Status: DC | PRN

## 2019-05-04 MED ORDER — INSULIN ASPART 100 UNIT/ML ~~LOC~~ SOLN: 0.0000 [IU] | Freq: Every day | SUBCUTANEOUS | Status: DC

## 2019-05-04 MED ORDER — LORATADINE 10 MG PO TABS
10.0000 mg | ORAL_TABLET | Freq: Every day | ORAL | Status: DC
  Administered 2019-05-05: 10 mg via ORAL
  Filled 2019-05-04: qty 1

## 2019-05-04 MED ORDER — LABETALOL HCL 5 MG/ML IV SOLN
10.0000 mg | Freq: Once | INTRAVENOUS | Status: AC
  Administered 2019-05-04: 10 mg via INTRAVENOUS
  Filled 2019-05-04: qty 4

## 2019-05-04 MED ORDER — POTASSIUM CHLORIDE CRYS ER 20 MEQ PO TBCR
40.0000 meq | EXTENDED_RELEASE_TABLET | Freq: Two times a day (BID) | ORAL | Status: AC
  Administered 2019-05-04 – 2019-05-05 (×2): 40 meq via ORAL
  Filled 2019-05-04 (×2): qty 2

## 2019-05-04 MED ORDER — ACETAMINOPHEN 650 MG RE SUPP: 650.0000 mg | RECTAL | Status: DC | PRN

## 2019-05-04 MED ORDER — ASPIRIN EC 81 MG PO TBEC
81.0000 mg | DELAYED_RELEASE_TABLET | Freq: Every day | ORAL | Status: DC
  Administered 2019-05-05: 81 mg via ORAL
  Filled 2019-05-04: qty 1

## 2019-05-04 MED ORDER — LORAZEPAM 2 MG/ML IJ SOLN
0.5000 mg | Freq: Once | INTRAMUSCULAR | Status: AC | PRN
  Administered 2019-05-04: 0.5 mg via INTRAVENOUS
  Filled 2019-05-04: qty 1

## 2019-05-04 MED ORDER — CANAGLIFLOZIN 100 MG PO TABS: 100.0000 mg | ORAL_TABLET | Freq: Every day | ORAL | Status: DC

## 2019-05-04 MED ORDER — PANTOPRAZOLE SODIUM 40 MG PO TBEC
40.0000 mg | DELAYED_RELEASE_TABLET | Freq: Every day | ORAL | Status: DC
  Administered 2019-05-05: 40 mg via ORAL
  Filled 2019-05-04: qty 1

## 2019-05-04 MED ORDER — ENOXAPARIN SODIUM 30 MG/0.3ML ~~LOC~~ SOLN
30.0000 mg | SUBCUTANEOUS | Status: DC
  Administered 2019-05-05: 30 mg via SUBCUTANEOUS
  Filled 2019-05-04 (×2): qty 0.3

## 2019-05-04 MED ORDER — INSULIN ASPART 100 UNIT/ML ~~LOC~~ SOLN: 0.0000 [IU] | Freq: Three times a day (TID) | SUBCUTANEOUS | Status: DC

## 2019-05-04 MED ORDER — POTASSIUM CHLORIDE 10 MEQ/100ML IV SOLN
10.0000 meq | INTRAVENOUS | Status: AC
  Administered 2019-05-04 (×2): 10 meq via INTRAVENOUS
  Filled 2019-05-04 (×2): qty 100

## 2019-05-04 MED ORDER — POTASSIUM CHLORIDE 20 MEQ/15ML (10%) PO SOLN
40.0000 meq | Freq: Once | ORAL | Status: AC
  Administered 2019-05-04: 40 meq via ORAL
  Filled 2019-05-04: qty 30

## 2019-05-04 MED ORDER — SENNOSIDES-DOCUSATE SODIUM 8.6-50 MG PO TABS: 1.0000 | ORAL_TABLET | Freq: Every evening | ORAL | Status: DC | PRN

## 2019-05-04 MED ORDER — LEVETIRACETAM 500 MG PO TABS
500.0000 mg | ORAL_TABLET | Freq: Two times a day (BID) | ORAL | Status: DC
  Administered 2019-05-04 – 2019-05-05 (×2): 500 mg via ORAL
  Filled 2019-05-04 (×2): qty 1

## 2019-05-04 MED ORDER — ROSUVASTATIN CALCIUM 20 MG PO TABS
20.0000 mg | ORAL_TABLET | Freq: Every day | ORAL | Status: DC
  Administered 2019-05-05: 20 mg via ORAL
  Filled 2019-05-04: qty 1

## 2019-05-04 MED ORDER — CARVEDILOL 3.125 MG PO TABS
3.1250 mg | ORAL_TABLET | Freq: Two times a day (BID) | ORAL | Status: DC
  Administered 2019-05-05: 3.125 mg via ORAL
  Filled 2019-05-04 (×2): qty 1

## 2019-05-04 MED ORDER — HYDRALAZINE HCL 10 MG PO TABS
10.0000 mg | ORAL_TABLET | Freq: Three times a day (TID) | ORAL | Status: DC | PRN
  Administered 2019-05-05: 10 mg via ORAL
  Filled 2019-05-04: qty 1

## 2019-05-04 MED ORDER — SERTRALINE HCL 50 MG PO TABS
50.0000 mg | ORAL_TABLET | Freq: Every day | ORAL | Status: DC
  Administered 2019-05-04: 50 mg via ORAL
  Filled 2019-05-04: qty 1

## 2019-05-04 NOTE — ED Provider Notes (Signed)
[facility] Provider Note   CSN: [id number] Arrival date & time: 05/04/19  1603     History   Chief Complaint Chief Complaint  Patient presents with   Hypertension    HPI Rebecca Gallagher is a 73 y.o. female.     73 y.o female with a PMH of HTN presents to the ED via EMS with a chief complaint of change in mental status. According to daughter patient was prescribed medication for blood pressure control she has been taking these incorrectly, only taking half of the dose.  According to daughter patient was ambulated with her walker, she reports she noted her to be somewhat confused, was speaking slower than usual and not acting herself.  This occurred around 2:20 PM.  Patient reports she is not having any discomfort, does not feel any weaker than usual.  She is alert to place, self and time.  She recently had a left posterior MCA on March 15, 2019.  According to chart, there is unknown source for the embolus as patient had a loop monitor which rule out any A. fib, had a negative DVT study. Daughter has provider history for me, states she feels patient's speech has treturned back to normal   The history is provided by the patient and medical records.  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/08/2019   Malignant hypertension 03/26/2019   GERD (gastroesophageal reflux disease) 03/26/2019   Depression 03/26/2019   Seizure disorder (HCC) 03/17/2019   TIA (transient ischemic attack) 03/15/2019   Hypertension 03/15/2019   Hyperlipidemia 03/15/2019   Diabetes mellitus (HCC) 03/15/2019   Hypokalemia 03/15/2019   Dehydration 03/15/2019   Elevated troponin  03/15/2019   Acute ischemic left middle cerebral artery (MCA) stroke (HCC) 03/15/2019    Past Surgical History:  Procedure Laterality Date   ABDOMINAL HYSTERECTOMY     CATARACT EXTRACTION     EYE SURGERY     LOOP RECORDER INSERTION N/A 03/17/2019   Procedure: LOOP RECORDER INSERTION;  Surgeon: Hillis RangeAllred, James, MD;  Location: [facility] INVASIVE CV LAB;  Service: Cardiovascular;  Laterality: N/A;   TOE AMPUTATION       OB History   No obstetric history on file.      Home Medications    Prior to Admission medications   Medication Sig Start Date End Date Taking? Authorizing Provider  aspirin EC 81 MG tablet Take 81 mg by mouth daily.    [provider]  Carboxymethylcellulose Sodium (ARTIFICIAL TEARS OP) Place 2 drops into both eyes daily.    [provider]  carvedilol (COREG) 3.125 MG tablet Take 1 tablet (3.125 mg total) by mouth 2 (two) times daily. 04/08/19 05/08/19  Edmon CrapeLassen, Arlo C, PA-C  cetirizine (ZYRTEC) 10 MG tablet Take 1 tablet (10 mg total) by mouth daily. For angioedema 04/08/19   Roena MaladyLassen, Arlo C, PA-C  INVOKANA 100 MG TABS tablet Take 1-1/2 tablets equal 150 mg p.o. daily before the first meal of the day 04/08/19   Edmon CrapeLassen, Arlo C, PA-C  levETIRAcetam (KEPPRA) 500 MG tablet Take 1 tablet (500 mg total) by mouth 2 (two) times daily. 04/28/19 05/28/19  Ihor AustinMcCue, Jessica, NP  olmesartan (BENICAR) 40 MG tablet Take 1 tablet (40 mg total) by mouth daily. 04/08/19   Edmon Crape C, PA-C  omeprazole (PRILOSEC) 20 MG capsule Take 1 capsule (20 mg total) by mouth daily. 30 minutes before morning meal 04/08/19   Lassen, Arlo C, PA-C  rosuvastatin (CRESTOR) 20 MG tablet Take 1 tablet (20 mg total) by mouth daily. 04/08/19   Edmon Crape C, PA-C  sertraline (ZOLOFT) 50 MG tablet Take 1 tablet (50 mg total) by mouth at bedtime. 04/08/19   Roena Malady, PA-C    Family History Family History  Problem Relation Age of Onset   Heart failure Mother    Hypertension Mother     Stroke Mother    Heart attack Father    Hypertension Father    Hypertension Sister    Hypertension Brother    Stroke Brother    Heart attack Brother     Social History Social History   Tobacco Use   Smoking status: Former Smoker    Quit date: 04/27/1969    Years since quitting: 50.0   Smokeless tobacco: Never Used  Substance Use Topics   Alcohol use: Yes    Comment: occ   Drug use: Never     Allergies   Hydrocodone-acetaminophen, Pregabalin, Valsartan, Yellow dye, Niacin, Acetaminophen, Interferons, Nisoldipine, and Losartan   Review of Systems Review of Systems  Unable to perform ROS: Mental status change     Physical Exam Updated Vital Signs BP (!) 188/65    Pulse (!) 50    Temp 99.4 F (37.4 C) (Oral)    Resp 15    Ht  (1.422 m)    Wt 49.9 kg    SpO2 99%    BMI 24.66 kg/m   Physical Exam Vitals signs and nursing note reviewed.  HENT:     Head: Normocephalic and atraumatic.     Nose: Nose normal.  Eyes:     Pupils: Pupils are equal, round, and reactive to light.  Neck:     Musculoskeletal: Normal range of motion and neck supple.  Cardiovascular:     Rate and Rhythm: Normal rate.     Pulses: Normal pulses.  Pulmonary:     Effort: Pulmonary effort is normal.     Breath sounds: Normal breath sounds. No wheezing or rhonchi.  Abdominal:     General: Abdomen is flat.     Tenderness: There is no abdominal tenderness.  Skin:    General: Skin is warm and dry.  Neurological:     Mental Status: She is alert and oriented to person, place, and time. Mental status is at baseline.     Cranial Nerves: No cranial nerve deficit, dysarthria or facial asymmetry.     Sensory: No sensory deficit.     Motor: No weakness.     Comments: Speech is slow.  No facial asymmetry, no dysarthria.   Right upper extremity slightly weaker than left, consistent with her previous neurological examination. Bilateral lower extremities with 5 out of 5 strength.       ED Treatments / Results  Labs (all labs ordered are listed, but only abnormal results are displayed) Labs Reviewed  COMPREHENSIVE METABOLIC PANEL - Abnormal; Notable for the following components:      Result Value   Potassium 2.6 (*)    Glucose, Bld 125 (*)    Creatinine, Ser 1.17 (*)    GFR calc non Af Amer 46 (*)    GFR calc Af Amer 54 (*)  Anion gap 18 (*)    All other components within normal limits  CBG MONITORING, ED - Abnormal; Notable for the following components:   Glucose-Capillary 123 (*)    All other components within normal limits  CBC WITH DIFFERENTIAL/PLATELET  PROTIME-INR  PROTIME-INR  APTT  RAPID URINE DRUG SCREEN, HOSP PERFORMED  URINALYSIS, ROUTINE W REFLEX MICROSCOPIC    EKG EKG Interpretation  Date/Time:  Wednesday May 04 2019 16:10:34 EST Ventricular Rate:  82 PR Interval:    QRS Duration: 158 QT Interval:  439 QTC Calculation: 513 R Axis:   23 Text Interpretation: Sinus rhythm Probable left atrial enlargement Left bundle branch block DOes not meet sgarbossa criteria for STEMI, ECG similar to Oct 2020 Confirmed by Octaviano Glow [phone]) on 05/04/2019 4:31:24 PM   Radiology Ct Head Wo Contrast  Result Date: 05/04/2019 CLINICAL DATA:  Altered level of consciousness EXAM: CT HEAD WITHOUT CONTRAST TECHNIQUE: Contiguous axial images were obtained from the base of the skull through the vertex without intravenous contrast. COMPARISON:  03/15/2019 FINDINGS: Brain: Area of decreased attenuation is noted in the left posterior parietal region consistent with the area of prior infarct from the prior study. No findings to suggest acute ischemia are seen. No hemorrhage or focal mass lesion is noted. Areas of prior lacunar infarcts are noted within the basal ganglia bilaterally. Vascular: No hyperdense vessel or unexpected calcification. Skull: Normal. Negative for fracture or focal lesion. Sinuses/Orbits: No acute finding. Other: None. IMPRESSION:  Changes of chronic ischemia without acute abnormality. Electronically Signed   By: Inez Catalina M.D.   On: 05/04/2019 19:08    Procedures Procedures (including critical care time)  Medications Ordered in ED Medications  potassium chloride 10 mEq in 100 mL IVPB (10 mEq Intravenous New Bag/Given 05/04/19 1816)  labetalol (NORMODYNE) injection 10 mg (10 mg Intravenous Given 05/04/19 1642)  potassium chloride 20 MEQ/15ML (10%) solution 40 mEq (40 mEq Oral Given 05/04/19 1808)     Initial Impression / Assessment and Plan / ED Course  I have reviewed the triage vital signs and the nursing notes.  Pertinent labs & imaging results that were available during my care of the patient were reviewed by me and considered in my medical decision making (see chart for details).  Clinical Course as of May 03 1948  Wed May 04, 2019  1652 Patient seen by myself as well as PA provider.  Briefly this is a 73 year old female with a hx of Subsequently MRI of the brain was obtained  infarct of left posterior MCA territory without hemorrhage seen on MRI in Oct 2020, with residual right sided weakness and initial dysarthria, presenting from home with family concerns for slurred speech.  The patient reports that she feels fine and has no complaints, and denies headache or chest pain or shortness of breath.  Patient's daughter at bedside reports that around 2 PM today while they were watching TV together, the patient told her "I will feel well" and began having slurred speech.  Daughter called 107 and brought the patient to the ED.  The time I was assessing the patient at the bedside, her speech had returned to baseline.  Her daughter no longer thinks it slurred.  My exam the patient has no obvious facial asymmetry, has clear speech, and is fully oriented.  She has some right-sided weakness which appears to be her baseline.  She has hypertensive with a blood pressure of 235 systolic.  Daughter reports that she did give her  dose of  Coreg 3.125 mg this morning, plus another half dose when her BP was elevated this afternoon.  Normal BP around 170's systolic per her daughter's report.     [MT]  1948 Potassium(!!): 2.6 [JS]    Clinical Course User Index [JS] Claude Manges, PA-C [MT] Renaye Rakers, Kermit Balo, MD        Patient with a recent left MCA presents to the ED via EMS for chief complaint of change in mental status.  According to daughter who was at the bedside patients which were significant different while they were watching TV.  She reports patient was looked like she felt unwell.  Patient recently had a left posterior MCA noted on CT angio head, she does have some right-sided weakness deficit from this however has been doing rehab.  According to daughter patient has not been taking her blood pressure medication as prescribed, she has been taking half of the dose.  Patient arrived in the ED, was difficult to understand her however has no new or weakness on my exam.  Daughter reports patient became back to baseline while arriving in the ED.  She arrived in the ED with a blood pressure of systolics in the 200s, she was given 10 mg of IV labetalol to help with her blood pressure.  Blood pressure has now changed to 188 systolic.  CMP remarkable for hypokalemia at 2.6, IV and oral K+ has been ordered.  She denies any diarrhea, vomiting.  Current function is slightly elevated.  CBC without any leukocytosis and otherwise unremarkable.PT/INR within normal limits. UDS is pending.  A CT of her head was obtained to further evaluate patient's symptoms.  This did not show any acute abnormalities,Changes of chronic ischemia without acute abnormality.  7:23 PM patient reassessed by me, she reports improvement in his symptoms, according to daughter she has returned to baseline.  I feel there is appropriate to touch base with neurology along with further admit patient for hypokalemia along with sudden onset of change in mental  status.  Discussed patient with Dr. Renaye Rakers who has also seen and evaluated patient and agrees with plan and management.  7:26 PM call placed for neurology for further recommendations. 7:30 PM Spoke to Dr. Laurence Slate who will see patient during hospital stay.  Called placed for hospitalist admission.  7:48 PM spoke to hospitalist Dr. Onalee Hua, appreciate his services.  Patient will be admitted for further management of her hypokalemia along with hypertensive urgency.  Portions of this note were generated with Scientist, clinical (histocompatibility and immunogenetics). Dictation errors may occur despite best attempts at proofreading.  Final Clinical Impressions(s) / ED Diagnoses   Final diagnoses:  Weakness  Hypokalemia  Hypertensive urgency    ED Discharge Orders    None       Claude Manges, Cordelia Poche 05/04/19 1949    Terald Sleeper, MD 05/05/19 1047

## 2019-05-04 NOTE — H&P (Signed)
History and Physical  Patient Name: Rebecca Gallagher     HYW:[id number]    DOB: 1946/01/06    DOA: 05/04/2019 PCP: Hayden Rasmussen, MD  Patient coming from: Home  Chief Complaint: Speech disturbance, hypertension      HPI: Rebecca Gallagher is a 73 y.o. F with hx DM, HTN, recent CVA with residual right-sided weakness who presents with abnormal speech, malaise.  The patient was discharged from rehab after her stroke 3 weeks ago, had been back at home, doing well until today around 2 PM she was watching television with her daughter when all of a sudden she said "I do not feel well", then subsequently was speaking in a garbled manner, and slurring her words.  She appeared unwell, and so the daughter called EMS.  In the ER, temp 90 64F, blood pressure 230/89, K2.6, WBC 7K, creatinine 1.17 (from baseline 0.8-1.1).  CT of the head was unremarkable.  UDS negative.  ECG showed old LBBB.  She was given IV labetalol and IV potassium in the hospital service were asked to evaluate for TIA.  Of note, the patient's carvedilol dose was cut in half during the nursing home stay due to bradycardia.  Two days prior to admission, [facility] PT had noticed her blood pressure was over 200.         ROS: Review of Systems  Constitutional: Positive for malaise/fatigue. Negative for chills and fever.  HENT: Negative for hearing loss.   Eyes: Negative for blurred vision.  Respiratory: Negative for cough, sputum production, shortness of breath and wheezing.   Cardiovascular: Negative for chest pain and leg swelling.  Neurological: Positive for speech change and weakness. Negative for dizziness, tingling, tremors, sensory change, focal weakness, seizures, loss of consciousness and headaches.  All other systems reviewed and are negative.         Past Medical History:  Diagnosis Date  . Depression   . Diabetes mellitus without complication ([place])   . High cholesterol   . Hypertension   . Seizures ([place])   . Stroke  [facility])     Past Surgical History:  Procedure Laterality Date  . ABDOMINAL HYSTERECTOMY    . CATARACT EXTRACTION    . EYE SURGERY    . LOOP RECORDER INSERTION N/A 03/17/2019   Procedure: LOOP RECORDER INSERTION;  Surgeon: Thompson Grayer, MD;  Location: [place] CV LAB;  Service: Cardiovascular;  Laterality: N/A;  . TOE AMPUTATION      Social History: Patient lives with her daughter.  The patient walks with a walker since her stroke.  Remote former smoker.  From [place].  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  Allergies  Allergen Reactions  . Hydrocodone-Acetaminophen Palpitations  . Pregabalin Palpitations  . Valsartan Palpitations  . Yellow Dye Itching    SWN:IOEVOJ Dye+nisoldipine  . Niacin Swelling  . Acetaminophen     Reported by daughter   . Interferons     Beta-1b  . Nisoldipine   . Losartan Palpitations    Family history: family history includes Heart attack in her brother and father; Heart failure in her mother; Hypertension in her brother, father, mother, and sister; Stroke in her brother and mother.  Prior to Admission medications   Medication Sig Start Date End Date Taking? Authorizing Provider  aspirin EC 81 MG tablet Take 81 mg by mouth daily.    [provider]  Carboxymethylcellulose Sodium (ARTIFICIAL TEARS OP) Place 2 drops into both eyes daily.    [provider]  carvedilol (COREG) 3.125  MG tablet Take 1 tablet (3.125 mg total) by mouth 2 (two) times daily. 04/08/19 05/08/19  Edmon CrapeLassen, Arlo C, PA-C  cetirizine (ZYRTEC) 10 MG tablet Take 1 tablet (10 mg total) by mouth daily. For angioedema 04/08/19   Roena MaladyLassen, Arlo C, PA-C  INVOKANA 100 MG TABS tablet Take 1-1/2 tablets equal 150 mg p.o. daily before the first meal of the day 04/08/19   Edmon CrapeLassen, Arlo C, PA-C  levETIRAcetam (KEPPRA) 500 MG tablet Take 1 tablet (500 mg total) by mouth 2 (two) times daily. 04/28/19 05/28/19  Ihor AustinMcCue, Jessica, NP  olmesartan (BENICAR) 40 MG tablet Take 1 tablet (40 mg total) by  mouth daily. 04/08/19   Edmon CrapeLassen, Arlo C, PA-C  omeprazole (PRILOSEC) 20 MG capsule Take 1 capsule (20 mg total) by mouth daily. 30 minutes before morning meal 04/08/19   Lassen, Arlo C, PA-C  rosuvastatin (CRESTOR) 20 MG tablet Take 1 tablet (20 mg total) by mouth daily. 04/08/19   Edmon CrapeLassen, Arlo C, PA-C  sertraline (ZOLOFT) 50 MG tablet Take 1 tablet (50 mg total) by mouth at bedtime. 04/08/19   Roena MaladyLassen, Arlo C, PA-C       Physical Exam: BP (!) 204/62   Pulse (!) 55   Temp 99.4 F (37.4 C) (Oral)   Resp 14   Ht 4\' 8"  (1.422 m)   Wt 49.9 kg   SpO2 100%   BMI 24.66 kg/m  General appearance: Small elderly adult female, alert and in no acute distress, lying in bed.   Eyes: Anicteric, conjunctiva inflamed on the right eye, normal in the left, lids and lashes normal. PERRL.    ENT: No nasal deformity, discharge, epistaxis.  Hearing slightly diminished. OP moist without lesions.   Skin: Warm and dry.  No jaundice.  No suspicious rashes or lesions. Cardiac: RRR, nl S1-S2, no murmurs appreciated.  Capillary refill is brisk.  JVP normal.  No LE edema.  Radial pulses 2+ and symmetric. Respiratory: Normal respiratory rate and rhythm.  CTAB without rales or wheezes. Abdomen: Abdomen soft.  No TTP or guarding. No ascites, distension, hepatosplenomegaly.   MSK: No deformities or effusions of the large joints of the upper or lower extremities bilaterally.  No cyanosis or clubbing. Neuro: Cranial nerves 3 through 12 intact.  Sensation intact to light touch. Speech is fluent.  Muscle strength slightly diminished on the right side, normal on the left for age.    Psych: Sensorium intact and responding to questions, attention normal.  Behavior appropriate.  Affect normal.  Judgment and insight appear normal.     Labs on Admission:  I have personally reviewed following labs and imaging studies: CBC: Recent Labs  Lab 05/04/19 1630  WBC 7.0  NEUTROABS 3.2  HGB 14.4  HCT 41.6  MCV 81.6  PLT 245    Basic Metabolic Panel: Recent Labs  Lab 05/04/19 1630  NA 144  K 2.6*  CL 102  CO2 24  GLUCOSE 125*  BUN 10  CREATININE 1.17*  CALCIUM 10.1   GFR: Estimated Creatinine Clearance: 28.2 mL/min (A) (by C-G formula based on SCr of 1.17 mg/dL (H)).  Liver Function Tests: Recent Labs  Lab 05/04/19 1630  AST 33  ALT 25  ALKPHOS 87  BILITOT 0.9  PROT 6.5  ALBUMIN 4.0    Coagulation Profile: Recent Labs  Lab 05/04/19 1630 05/04/19 1715  INR 1.0 1.1    CBG: Recent Labs  Lab 05/04/19 1609  GLUCAP 123*      Radiological Exams on Admission: Personally  reviewed CT head report, shows no acute intracranial process: Ct Head Wo Contrast  Result Date: 05/04/2019 CLINICAL DATA:  Altered level of consciousness EXAM: CT HEAD WITHOUT CONTRAST TECHNIQUE: Contiguous axial images were obtained from the base of the skull through the vertex without intravenous contrast. COMPARISON:  03/15/2019 FINDINGS: Brain: Area of decreased attenuation is noted in the left posterior parietal region consistent with the area of prior infarct from the prior study. No findings to suggest acute ischemia are seen. No hemorrhage or focal mass lesion is noted. Areas of prior lacunar infarcts are noted within the basal ganglia bilaterally. Vascular: No hyperdense vessel or unexpected calcification. Skull: Normal. Negative for fracture or focal lesion. Sinuses/Orbits: No acute finding. Other: None. IMPRESSION: Changes of chronic ischemia without acute abnormality. Electronically Signed   By: Alcide Clever M.D.   On: 05/04/2019 19:08    EKG: Independently reviewed.  LBBB, rate 82, change from prior.       Assessment/Plan    TIA versus hypertensive encephalopathy/hypertensive emergency Patient presents new neurologic deficit and blood pressure 230/89 mmHg.  This may have been an embolic TIA or new stroke, but I favor that it was hypertension-related dysfunction.  In light of that, and also her age and  recent stroke, I recommend in-hospital management.  Given her mental status is back to baseline, will use oral agents.  Goal reduction 25% overnight (to 170-180 systolic) then gradually reduce to normal.  -Obtain MRI brain -Consult neurology, appreciate recommendations -Monitor on telemetry, neurochecks, NIHSS scoring, swallow eval -Defer lipids, carotids and echo given recently done  -Start amlodipine 2.5 mg  -Continue carvedilol 3.125 mg BID (this is the reduced dose from prior, given HR) -Continue olmesartan 40 mg Daily  -PRN hydralazine for SBP > 180  -PT eval    Hypokalemia -Check mag -Continue potassium supplement  Diabetes -Continue Invokana -Sliding scale corrections as needed  Cerebrovascular disease Seizure prevention Had recent L MCA stroke with residual right sided weakness.  EEG during that admission showed epileptic focus, started on prophylactic Keppra -Continue aspirin, Crestor -Continue Keppra  Depression -Continue sertraline  Heartburn -Continue PPI  Allergic rhinitis -Continue loratadine   Renal insufficiency Cr slightly up from baseline 0.8-1.0.  -Trend Cr    DVT prophylaxis: Lovenox  Code Status: DO NOT RESUSCITATE  Family Communication: Daughter at bedside  Disposition Plan: Anticipate reducing blood pressure by 25% overnight and then continue BP management tomorrow, pending overnight course and MRI brain Consults called: Neurology Admission status: OBS   At the point of initial evaluation, it is my clinical opinion that admission for OBSERVATION is reasonable and necessary because the patient's presenting complaints in the context of their chronic conditions represent sufficient risk of deterioration or significant morbidity to constitute reasonable grounds for close observation in the hospital setting, but that the patient may be medically stable for discharge from the hospital within 24 to 48 hours.    Medical decision making:  Patient seen at 8:19 PM on 05/04/2019.  The patient was discussed with Claude Gallagher, PA_C.  What exists of the patient's chart was reviewed in depth and summarized above.  Clinical condition: stable.        Earl Lites [place] [facility] Please page though AMION or Epic secure chat:  For password, contact charge nurse

## 2019-05-04 NOTE — ED Notes (Signed)
ED TO INPATIENT HANDOFF REPORT  ED Nurse Name and Phone #: Mickeal Skinner [phone]  S Name/Age/Gender Rebecca Gallagher 73 y.o. female Room/Bed: 031C/031C  Code Status   Code Status: Prior  Home/SNF/Other Home Patient oriented to: self, place, time and situation Is this baseline? Yes   Triage Complete: Triage complete  Chief Complaint HTN  Triage Note Pt BIB [facility] for HTN. Per EMS patient's family called them after patient's blood pressure was high this afternoon. Family also reported that patient was talking differently than normal to them. Pt has only been taking half of her prescribed BP medication due to a "mix up" with the medication according to EMS. Pt had a CVA about a month ago and does have some residual right sided weakness that she was receiving rehab for. Pt arrives HTN at 226/197, oriented to person and place and disoriented to time. Family reports that patient last known well was at 1420.    Allergies Allergies  Allergen Reactions  . Hydrocodone-Acetaminophen Palpitations  . Pregabalin Palpitations  . Valsartan Palpitations  . Yellow Dye Itching    GUY:QIHKVQ Dye+nisoldipine  . Niacin Swelling  . Acetaminophen     Reported by daughter   . Interferons     Beta-1b  . Nisoldipine   . Losartan Palpitations    Level of Care/Admitting Diagnosis ED Disposition    ED Disposition Condition Comment   Admit  Hospital Area: [facility] [[id number]]  Level of Care: Telemetry Medical [104]  I expect the patient will be discharged within 24 hours: No (not a candidate for 5C-Observation unit)  Covid Evaluation: Asymptomatic Screening Protocol (No Symptoms)  Diagnosis: Hypertensive encephalopathy [437.2.ICD-9-CM]  Admitting Physician: Alberteen Sam [[id number]]  Attending Physician: Alberteen Sam [[id number]]  PT Class (Do Not Modify): Observation [104]  PT Acc Code (Do Not Modify): Observation [10022]       B Medical/Surgery History Past Medical  History:  Diagnosis Date  . Depression   . Diabetes mellitus without complication (HCC)   . High cholesterol   . Hypertension   . Seizures (HCC)   . Stroke [facility])    Past Surgical History:  Procedure Laterality Date  . ABDOMINAL HYSTERECTOMY    . CATARACT EXTRACTION    . EYE SURGERY    . LOOP RECORDER INSERTION N/A 03/17/2019   Procedure: LOOP RECORDER INSERTION;  Surgeon: Hillis Range, MD;  Location: [facility] INVASIVE CV LAB;  Service: Cardiovascular;  Laterality: N/A;  . TOE AMPUTATION       A IV Location/Drains/Wounds Patient Lines/Drains/Airways Status   Active Line/Drains/Airways    Name:   Placement date:   Placement time:   Site:   Days:   Peripheral IV 05/04/19 Right Antecubital   05/04/19    1800    Antecubital   less than 1   External Urinary Catheter   03/16/19    2030    -   49   External Urinary Catheter   05/04/19    1848    -   less than 1   Incision (Closed) 03/18/19 Chest Left   03/18/19    0400     47          Intake/Output Last 24 hours No intake or output data in the 24 hours ending 05/04/19 2029  Labs/Imaging Results for orders placed or performed during the hospital encounter of 05/04/19 (from the past 48 hour(s))  CBG monitoring, ED     Status: Abnormal   Collection Time: 05/04/19  4:09 PM  Result Value Ref Range   Glucose-Capillary 123 (H) 70 - 99 mg/dL  Comprehensive metabolic panel     Status: Abnormal   Collection Time: 05/04/19  4:30 PM  Result Value Ref Range   Sodium 144 135 - 145 mmol/L   Potassium 2.6 (LL) 3.5 - 5.1 mmol/L    Comment: CRITICAL RESULT CALLED TO, READ BACK BY AND VERIFIED WITH: J.KOPP,RN 1732 [id number] I.MANNING    Chloride 102 98 - 111 mmol/L   CO2 24 22 - 32 mmol/L   Glucose, Bld 125 (H) 70 - 99 mg/dL   BUN 10 8 - 23 mg/dL   Creatinine, Ser 1.17 (H) 0.44 - 1.00 mg/dL   Calcium 10.1 8.9 - 10.3 mg/dL   Total Protein 6.5 6.5 - 8.1 g/dL   Albumin 4.0 3.5 - 5.0 g/dL   AST 33 15 - 41 U/L   ALT 25 0 - 44 U/L   Alkaline  Phosphatase 87 38 - 126 U/L   Total Bilirubin 0.9 0.3 - 1.2 mg/dL   GFR calc non Af Amer 46 (L) >60 mL/min   GFR calc Af Amer 54 (L) >60 mL/min   Anion gap 18 (H) 5 - 15    Comment: Performed at [facility] [address], [place], [place] [address]  CBC WITH DIFFERENTIAL     Status: None   Collection Time: 05/04/19  4:30 PM  Result Value Ref Range   WBC 7.0 4.0 - 10.5 K/uL   RBC 5.10 3.87 - 5.11 MIL/uL   Hemoglobin 14.4 12.0 - 15.0 g/dL   HCT 41.6 36.0 - 46.0 %   MCV 81.6 80.0 - 100.0 fL   MCH 28.2 26.0 - 34.0 pg   MCHC 34.6 30.0 - 36.0 g/dL   RDW 13.0 11.5 - 15.5 %   Platelets 245 150 - 400 K/uL   nRBC 0.0 0.0 - 0.2 %   Neutrophils Relative % 46 %   Neutro Abs 3.2 1.7 - 7.7 K/uL   Lymphocytes Relative 35 %   Lymphs Abs 2.5 0.7 - 4.0 K/uL   Monocytes Relative 12 %   Monocytes Absolute 0.8 0.1 - 1.0 K/uL   Eosinophils Relative 6 %   Eosinophils Absolute 0.4 0.0 - 0.5 K/uL   Basophils Relative 1 %   Basophils Absolute 0.1 0.0 - 0.1 K/uL   Immature Granulocytes 0 %   Abs Immature Granulocytes 0.01 0.00 - 0.07 K/uL    Comment: Performed at [facility], [address], [place], [place] [address]  Protime-INR     Status: None   Collection Time: 05/04/19  4:30 PM  Result Value Ref Range   Prothrombin Time 13.0 11.4 - 15.2 seconds    Comment: SPECIMEN CLOTTED G KOPP,RN 1707 05/04/2019 [facility]    INR 1.0 0.8 - 1.2    Comment: SPECIMEN CLOTTED G KOPP,RN 1707 05/04/2019 [facility] (NOTE) INR goal varies based on device and disease states. Performed at [facility], [place] [address], [place], [place] [address]   Protime-INR     Status: None   Collection Time: 05/04/19  5:15 PM  Result Value Ref Range   Prothrombin Time 13.6 11.4 - 15.2 seconds   INR 1.1 0.8 - 1.2    Comment: (NOTE) INR goal varies based on device and disease states. Performed at [facility], [place] [address] [place], [place] [address]   APTT     Status: None   Collection Time: 05/04/19  5:15 PM  Result Value Ref Range   aPTT 26 24 - 36 seconds    Comment: Performed at [facility], [address], [place], [place] [address]  Urine rapid drug screen (hosp performed)     Status: None   Collection Time: 05/04/19  7:08 PM  Result Value Ref Range   Opiates NONE DETECTED NONE DETECTED   Cocaine NONE DETECTED NONE DETECTED   Benzodiazepines NONE DETECTED NONE DETECTED   Amphetamines NONE DETECTED NONE DETECTED   Tetrahydrocannabinol NONE DETECTED NONE DETECTED   Barbiturates NONE DETECTED NONE DETECTED    Comment: (NOTE) DRUG SCREEN FOR MEDICAL PURPOSES ONLY.  IF CONFIRMATION IS NEEDED FOR ANY PURPOSE, NOTIFY LAB WITHIN 5 DAYS. LOWEST DETECTABLE LIMITS FOR URINE DRUG SCREEN Drug Class                     Cutoff (ng/mL) Amphetamine and metabolites    1000 Barbiturate and metabolites    200 Benzodiazepine                 200 Tricyclics and metabolites     300 Opiates and metabolites        300 Cocaine and metabolites        300 THC                            50 Performed at [facility], [address], [place], [place] [address]   Urinalysis, Routine w reflex microscopic     Status: Abnormal   Collection Time: 05/04/19  7:09 PM  Result Value Ref Range   Color, Urine STRAW (A) YELLOW   APPearance CLEAR CLEAR   Specific Gravity, Urine 1.010 1.005 - 1.030   pH 6.0 5.0 - 8.0   Glucose, UA >=500 (A) NEGATIVE mg/dL   Hgb urine dipstick SMALL (A) NEGATIVE   Bilirubin Urine NEGATIVE NEGATIVE   Ketones, ur 20 (A) NEGATIVE mg/dL   Protein, ur NEGATIVE NEGATIVE mg/dL   Nitrite NEGATIVE NEGATIVE   Leukocytes,Ua NEGATIVE NEGATIVE   RBC / HPF 6-10 0 - 5 RBC/hpf   WBC, UA 0-5 0 - 5 WBC/hpf   Bacteria, UA NONE SEEN NONE SEEN   Squamous Epithelial / LPF 0-5 0 - 5   Mucus PRESENT     Comment: Performed at [facility], [address] [place], [place] [address]   Ct Head Wo Contrast  Result Date: 05/04/2019 CLINICAL DATA:  Altered level of  consciousness EXAM: CT HEAD WITHOUT CONTRAST TECHNIQUE: Contiguous axial images were obtained from the base of the skull through the vertex without intravenous contrast. COMPARISON:  03/15/2019 FINDINGS: Brain: Area of decreased attenuation is noted in the left posterior parietal region consistent with the area of prior infarct from the prior study. No findings to suggest acute ischemia are seen. No hemorrhage or focal mass lesion is noted. Areas of prior lacunar infarcts are noted within the basal ganglia bilaterally. Vascular: No hyperdense vessel or unexpected calcification. Skull: Normal. Negative for fracture or focal lesion. Sinuses/Orbits: No acute finding. Other: None. IMPRESSION: Changes of chronic ischemia without acute abnormality. Electronically Signed   By: Alcide Clever M.D.   On: 05/04/2019 19:08    Pending Labs Unresulted Labs (From admission, onward)    Start     Ordered   05/04/19 2022  Troponin T  Add-on,   AD     05/04/19 2021   Signed and  Held  Creatinine, serum  (enoxaparin (LOVENOX)    CrCl >/= 30 ml/min)  Weekly,   R    Comments: while on enoxaparin therapy    Signed and Held          Vitals/Pain Today's Vitals   05/04/19 1900 05/04/19 1930 05/04/19 1941 05/04/19 2000  BP: (!) 188/65 (!) 164/70  (!) 204/62  Pulse: (!) 50 (!) 54  (!) 55  Resp: 15 12  14   Temp:      TempSrc:      SpO2: 99% 97%  100%  Weight:      Height:      PainSc:   0-No pain     Isolation Precautions No active isolations  Medications Medications  amLODipine (NORVASC) tablet 2.5 mg (has no administration in time range)  LORazepam (ATIVAN) injection 0.5 mg (has no administration in time range)  labetalol (NORMODYNE) injection 10 mg (10 mg Intravenous Given 05/04/19 1642)  potassium chloride 10 mEq in 100 mL IVPB (10 mEq Intravenous New Bag/Given 05/04/19 1928)  potassium chloride 20 MEQ/15ML (10%) solution 40 mEq (40 mEq Oral Given 05/04/19 1808)    Mobility walks     Focused  Assessments Neuro Assessment Handoff:  Swallow screen pass?    NIH Stroke Scale ( + Modified Stroke Scale Criteria)  Interval: Shift assessment Level of Consciousness (1a.)   : Alert, keenly responsive LOC Questions (1b. )   +: Answers both questions correctly LOC Commands (1c. )   + : Performs both tasks correctly Best Gaze (2. )  +: Normal Visual (3. )  +: No visual loss Facial Palsy (4. )    : Normal symmetrical movements Motor Arm, Left (5a. )   +: No drift Motor Arm, Right (5b. )   +: Drift Motor Leg, Left (6a. )   +: No drift Motor Leg, Right (6b. )   +: Drift Limb Ataxia (7. ): Absent Sensory (8. )   +: Normal, no sensory loss Best Language (9. )   +: No aphasia Dysarthria (10. ): Normal Extinction/Inattention (11.)   +: No Abnormality Modified SS Total  +: 2 Complete NIHSS TOTAL: 2     Neuro Assessment: Exceptions to WDL Neuro Checks:   Initial (05/04/19 1617)  Last Documented NIHSS Modified Score: 2 (05/04/19 1938) Has TPA been given? No If patient is a Neuro Trauma and patient is going to OR before floor call report to 4N Charge nurse: [phone] or [phone]     R Recommendations: See Admitting Provider Note  Report given to:   Additional Notes:

## 2019-05-04 NOTE — ED Notes (Signed)
Per Main Lab, PTT clotted, this RN to redraw.

## 2019-05-04 NOTE — Progress Notes (Signed)
PHARMACIST - PHYSICIAN COMMUNICATION  CONCERNING:  Outpatient Invokana reordered for inpatient use   RECOMMENDATION: Invokana has been d/c'd d/t eGFR <45; current SCr 1.17, baseline 0.8.  May resume if elevated SCr resolves.  Wynona Neat, PharmD, BCPS 05/04/2019 11:13 PM

## 2019-05-04 NOTE — ED Notes (Signed)
Patient transported to CT 

## 2019-05-04 NOTE — ED Triage Notes (Signed)
Pt BIB [facility] for HTN. Per EMS patient's family called them after patient's blood pressure was high this afternoon. Family also reported that patient was talking differently than normal to them. Pt has only been taking half of her prescribed BP medication due to a "mix up" with the medication according to EMS. Pt had a CVA about a month ago and does have some residual right sided weakness that she was receiving rehab for. Pt arrives HTN at 226/197, oriented to person and place and disoriented to time. Family reports that patient last known well was at 1420.

## 2019-05-05 DIAGNOSIS — I161 Hypertensive emergency: Secondary | ICD-10-CM | POA: Diagnosis not present

## 2019-05-05 DIAGNOSIS — E876 Hypokalemia: Secondary | ICD-10-CM | POA: Diagnosis not present

## 2019-05-05 LAB — CBC
HCT: 38 % (ref 36.0–46.0)
Hemoglobin: 13 g/dL (ref 12.0–15.0)
MCH: 28.1 pg (ref 26.0–34.0)
MCHC: 34.2 g/dL (ref 30.0–36.0)
MCV: 82.1 fL (ref 80.0–100.0)
Platelets: 246 10*3/uL (ref 150–400)
RBC: 4.63 MIL/uL (ref 3.87–5.11)
RDW: 13.2 % (ref 11.5–15.5)
WBC: 7.5 10*3/uL (ref 4.0–10.5)
nRBC: 0 % (ref 0.0–0.2)

## 2019-05-05 LAB — BASIC METABOLIC PANEL
Anion gap: 15 (ref 5–15)
BUN: 12 mg/dL (ref 8–23)
CO2: 19 mmol/L — ABNORMAL LOW (ref 22–32)
Calcium: 9.4 mg/dL (ref 8.9–10.3)
Chloride: 110 mmol/L (ref 98–111)
Creatinine, Ser: 1.11 mg/dL — ABNORMAL HIGH (ref 0.44–1.00)
GFR calc Af Amer: 57 mL/min — ABNORMAL LOW (ref >60)
GFR calc non Af Amer: 49 mL/min — ABNORMAL LOW (ref >60)
Glucose, Bld: 93 mg/dL (ref 70–99)
Potassium: 3.8 mmol/L (ref 3.5–5.1)
Sodium: 144 mmol/L (ref 135–145)

## 2019-05-05 LAB — GLUCOSE, CAPILLARY
Glucose-Capillary: 77 mg/dL (ref 70–99)
Glucose-Capillary: 87 mg/dL (ref 70–99)

## 2019-05-05 LAB — SARS CORONAVIRUS 2 (TAT 6-24 HRS): SARS Coronavirus 2: NEGATIVE

## 2019-05-05 LAB — TROPONIN I (HIGH SENSITIVITY): Troponin I (High Sensitivity): 93 ng/L — ABNORMAL HIGH (ref <18)

## 2019-05-05 MED ORDER — LABETALOL HCL 5 MG/ML IV SOLN: 10.0000 mg | INTRAVENOUS | Status: DC | PRN

## 2019-05-05 MED ORDER — AMLODIPINE BESYLATE 2.5 MG PO TABS: 2.5000 mg | ORAL_TABLET | Freq: Every day | ORAL | 0 refills | Status: AC

## 2019-05-05 NOTE — Progress Notes (Signed)
Patient needs a bedside swallow eval-- if she passes, can have a diet-- not sure why this was not done in ER or last night on floor. JV

## 2019-05-05 NOTE — Progress Notes (Signed)
SLP Cancellation Note  Patient Details Name: Rebecca Gallagher MRN: [id number] DOB: April 03, 1946   Cancelled treatment:       Reason Eval/Treat Not Completed: Other (comment). RN to complete [facility] swallow screen. Will await orders if pt fails and SLP eval needed. Based on notes, pt likely back to baseline and tolerating PO.   Herbie [place], MA CCC-SLP  Acute Rehabilitation Services Pager [phone] Office [phone]  Lynann Beaver 05/05/2019, 11:26 AM

## 2019-05-05 NOTE — Plan of Care (Signed)

## 2019-05-05 NOTE — Progress Notes (Signed)
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 CT in the ER was unremarkable, SBP > 200, noted to have hypokalemia and admitted to medicine service. Neurology consulted for further recommendations.  Subjective: Patient is doing well today, reports she no longer feels fatigued or has difficulty with speech, or any other focal sxs. She is neurologically intact with mild RUE weakness from recent stroke as stated above. No complaints, at neuro baseline. BP has been around SBP 140-150.  Exam: Vitals:   05/05/19 0806 05/05/19 0859  BP: (!) 156/47 (!) 148/49  Pulse: (!) 53 (!) 57  Resp: 18 18  Temp: 97.9 F (36.6 C) 98.2 F (36.8 C)  SpO2: 98% 99%    ROS General ROS: negative for - chills, fatigue, fever, night sweats, weight gain or weight loss Psychological ROS: negative for - behavioral disorder, hallucinations, memory difficulties, mood swings or suicidal ideation Ophthalmic ROS: negative for - blurry vision, double vision, eye pain or loss of vision ENT ROS: negative for - epistaxis, nasal discharge, oral lesions, sore throat, tinnitus or vertigo Allergy and Immunology ROS: negative for - hives or itchy/watery eyes Hematological and Lymphatic ROS: negative for - bleeding problems, bruising or swollen lymph nodes Endocrine ROS: negative for - galactorrhea, hair pattern changes, polydipsia/polyuria or temperature intolerance Respiratory ROS: negative for - cough, hemoptysis, shortness of breath or wheezing Cardiovascular ROS: negative for - chest pain, dyspnea on exertion, edema or irregular heartbeat Gastrointestinal ROS: negative for - abdominal pain, diarrhea, hematemesis, nausea/vomiting or  stool incontinence Genito-Urinary ROS: negative for - dysuria, hematuria, incontinence or urinary frequency/urgency Musculoskeletal ROS: negative for - joint swelling. (+) RUE weakness Neurological ROS: as noted in HPI Dermatological ROS: negative for rash and skin lesion changes   Physical Exam  Constitutional: Appears well-developed and well-nourished.  Psych: Affect appropriate to situation Eyes: No scleral injection HENT: No OP obstrucion Head: Normocephalic.  Cardiovascular: Normal rate and regular rhythm.  Respiratory: Effort normal, non-labored breathing GI: Soft.  No distension. There is no tenderness.  Skin: WDI  Neuro:  Mental Status: Alert, oriented, thought content appropriate.  Speech fluent without evidence of aphasia.  Able to follow 3 step commands without difficulty.  Cranial Nerves: II:  Visual fields grossly normal,  III,IV, VI: ptosis not present, extra-ocular motions intact bilaterally pupils equal, round, reactive to light and accommodation V,VII: smile symmetric, facial light touch sensation normal bilaterally VIII: hearing normal bilaterally IX,X: Palate rises midline XI: bilateral shoulder shrug XII: midline tongue extension  Motor: Right : Upper extremity   4+/5    Left:     Upper extremity   5/5  Lower extremity   5/5     Lower extremity   5/5 Tone and bulk:normal tone throughout; no atrophy noted Sensory: Pinprick and light touch intact throughout, bilaterally Deep Tendon Reflexes: 2+ and symmetric throughout Plantars: Right: downgoing   Left: downgoing Cerebellar: normal finger-to-nose, normal rapid alternating movements and normal heel-to-shin test Gait: normal gait and station  General: Appears well-developed  Psych: Affect appropriate to situation Eyes: No scleral injection HENT: No OP obstrucion Head: Normocephalic.  Cardiovascular: Normal rate and regular rhythm.  Respiratory: Effort normal and breath sounds normal to anterior  ascultation GI: Soft. No distension. There is no tenderness.  Skin:  WDI  ASSESSMENT AND PLAN  73 y.o. 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  Patient presentation likely secondary to hypertensive emergency. - CT and MRI neg for acute infarct or findings suggestive of TIA - at neurologic baseline - BP goal < 180, ideally < 160, pt is hovering in SBP 140-150s today  Recommendations - discontinue neuro checks, from neuro stand point, pt can resume regular diet - Patient sxs resolved with improvement of BP, neuro team will sign off  Posey Pronto PA-C [facility] [phone]

## 2019-05-05 NOTE — Evaluation (Signed)
Physical Therapy Evaluation Patient Details Name: Rebecca Gallagher MRN: [id number] DOB: February 04, 1946 Today's Date: 05/05/2019   History of Present Illness  72 y.o. F with hx DM, HTN, recent CVA with residual right-sided weakness who presents with abnormal speech, malaise. Pt recently discharged from rehab and had been ambulating with RW.  Clinical Impression  Pt demonstrates deficits in strength, power, gait, functional mobility, and balance compared to pre-CVA baseline. Pt was independent prior to CVA 5 weeks ago and has progressed to ambulating with RW and is continuing to work on stair training with [place] prior to this admission. Pt remains able to perform all mobility required in the home with use of RW and close supervision. Pt will continue to benefit from acute PT POC to aide in a return to independent mobility.    Follow Up Recommendations Home health PT    Equipment Recommendations  None recommended by PT(pt already owns necessary DME)    Recommendations for Other Services       Precautions / Restrictions Precautions Precautions: Fall Restrictions Weight Bearing Restrictions: No      Mobility  Bed Mobility Overal bed mobility: Modified Independent                Transfers Overall transfer level: Modified independent Equipment used: Rolling walker (2 wheeled) Transfers: Sit to/from Stand Sit to Stand: Modified independent (Device/Increase time)            Ambulation/Gait Ambulation/Gait assistance: Supervision Gait Distance (Feet): 300 Feet Assistive device: Rolling walker (2 wheeled) Gait Pattern/deviations: Step-to pattern Gait velocity: reduced Gait velocity interpretation: 1.31 - 2.62 ft/sec, indicative of limited community ambulator General Gait Details: pt with steady step through gait, no significant LOB, decreasd DF of R ankle with increased ambulation distance  Stairs Stairs: Yes Stairs assistance: Supervision Stair Management: One rail  Right;Sideways Number of Stairs: 5    Wheelchair Mobility    Modified Rankin (Stroke Patients Only)       Balance Overall balance assessment: Needs assistance Sitting-balance support: No upper extremity supported;Feet unsupported Sitting balance-Leahy Scale: Good     Standing balance support: Bilateral upper extremity supported Standing balance-Leahy Scale: Good Standing balance comment: supervision with BUE support of RW                             Pertinent Vitals/Pain Pain Assessment: No/denies pain    Home Living Family/patient expects to be discharged to:: Private residence Living Arrangements: Children Available Help at Discharge: Available PRN/intermittently;Family Type of Home: House Home Access: Stairs to enter Entrance Stairs-Rails: Right Entrance Stairs-Number of Steps: 5 Home Layout: Two level;Able to live on main level with bedroom/bathroom Home Equipment: Walker - 2 wheels      Prior Function Level of Independence: Needs assistance(pt totally independent prior to CVA ~5 weeks ago)   Gait / Transfers Assistance Needed: pt ambulating with use of RW since D/C from SNF, taking sponge bath, progressing with stair training with HHPT           Hand Dominance   Dominant Hand: Right    Extremity/Trunk Assessment   Upper Extremity Assessment Upper Extremity Assessment: RUE deficits/detail RUE Deficits / Details: grossly 4-/5    Lower Extremity Assessment Lower Extremity Assessment: RLE deficits/detail RLE Deficits / Details: grossly 4-/5    Cervical / Trunk Assessment Cervical / Trunk Assessment: Normal  Communication   Communication: Expressive difficulties  Cognition Arousal/Alertness: Awake/alert Behavior During Therapy: WFL for tasks assessed/performed Overall  Cognitive Status: Within Functional Limits for tasks assessed                                        General Comments General comments (skin integrity,  edema, etc.): VSS, BP stable at 151/54 pre session and 157/57 post session    Exercises     Assessment/Plan    PT Assessment Patient needs continued PT services  PT Problem List Decreased strength;Decreased activity tolerance;Decreased balance;Decreased mobility;Decreased knowledge of use of DME;Decreased safety awareness;Decreased knowledge of precautions       PT Treatment Interventions DME instruction;Gait training;Stair training;Neuromuscular re-education;Balance training    PT Goals (Current goals can be found in the Care Plan section)  Acute Rehab PT Goals Patient Stated Goal: To return to independent mobility PT Goal Formulation: With patient Time For Goal Achievement: 05/19/19 Potential to Achieve Goals: Good Additional Goals Additional Goal #1: Pt will maintain dynamic standing balance within 10 inches of her base of support without UE support with supervision.    Frequency Min 3X/week   Barriers to discharge        Co-evaluation               AM-PAC PT "6 Clicks" Mobility  Outcome Measure Help needed turning from your back to your side while in a flat bed without using bedrails?: None Help needed moving from lying on your back to sitting on the side of a flat bed without using bedrails?: None Help needed moving to and from a bed to a chair (including a wheelchair)?: None Help needed standing up from a chair using your arms (e.g., wheelchair or bedside chair)?: None Help needed to walk in hospital room?: None Help needed climbing 3-5 steps with a railing? : None 6 Click Score: 24    End of Session Equipment Utilized During Treatment: (none) Activity Tolerance: Patient tolerated treatment well Patient left: in chair;with call bell/phone within reach Nurse Communication: Mobility status PT Visit Diagnosis: Muscle weakness (generalized) (M62.81)    Time: 6010-9323 PT Time Calculation (min) (ACUTE ONLY): 28 min   Charges:   PT Evaluation $PT Eval Low  Complexity: 1 Low          Arlyss Gandy, PT, DPT Acute Rehabilitation Pager: [phone]   Arlyss Gandy 05/05/2019, 10:11 AM

## 2019-05-05 NOTE — Consult Note (Signed)
Requesting Physician: Claude MangesSoto, Johana PA-C    Chief Complaint:   History obtained from: Patient and Chart     HPI:                                                                                                                                       Rebecca Gallagher is a 73 y.o. female with past medical history significant for hypertension, diabetes mellitus, recent stroke with residual right-sided weakness, history of seizures presents to the emergency department with increased fatigue, slurring of speech in the setting of accelerated hypertension in the 230s.  Patient recently discharged after having an left posterior MCA infarct around 2 PM was noted to be more confused than normal and having a slower speech than her normal self.  Blood pressure was checked at the time and her blood pressure was significantly elevated at 230 systolic.  As she was not feeling well daughter called EMS.  CT of the head was repeated in the emergency room and unremarkable. Patient also noted to have hypokalemia and BP >200 systolic and admitted to medicine service.  Neurology consulted for further recommendations.  Date last known well: 11.25.20 Time last known well: 2 pm    Past Medical History:  Diagnosis Date  . Depression   . Diabetes mellitus without complication (HCC)   . High cholesterol   . Hypertension   . Seizures (HCC)   . Stroke [facility](HCC)     Past Surgical History:  Procedure Laterality Date  . ABDOMINAL HYSTERECTOMY    . CATARACT EXTRACTION    . EYE SURGERY    . LOOP RECORDER INSERTION N/A 03/17/2019   Procedure: LOOP RECORDER INSERTION;  Surgeon: Hillis RangeAllred, James, MD;  Location: [facility] INVASIVE CV LAB;  Service: Cardiovascular;  Laterality: N/A;  . TOE AMPUTATION      Family History  Problem Relation Age of Onset  . Heart failure Mother   . Hypertension Mother   . Stroke Mother   . Heart attack Father   . Hypertension Father   . Hypertension Sister   . Hypertension Brother   . Stroke Brother    . Heart attack Brother    Social History:  reports that she quit smoking about 50 years ago. She has never used smokeless tobacco. She reports current alcohol use. She reports that she does not use drugs.  Allergies:  Allergies  Allergen Reactions  . Hydrocodone-Acetaminophen Palpitations  . Pregabalin Palpitations  . Valsartan Palpitations  . Yellow Dye Itching    HYQ:MVHQIOKdc:yellow Dye+nisoldipine  . Niacin Swelling  . Acetaminophen     Reported by daughter   . Interferons     Beta-1b  . Nisoldipine   . Losartan Palpitations    Medications:  I reviewed home medications   ROS:                                                                                                                                     14 systems reviewed and negative except above   Examination:                                                                                                      General: Appears well-developed  Psych: Affect appropriate to situation Eyes: No scleral injection HENT: No OP obstrucion Head: Normocephalic.  Cardiovascular: Normal rate and regular rhythm.  Respiratory: Effort normal and breath sounds normal to anterior ascultation GI: Soft.  No distension. There is no tenderness.  Skin: WDI    Neurological Examination Mental Status: Alert, oriented, thought content appropriate.  Speech fluent without evidence of aphasia. Able to follow simple commands without difficulty. Cranial Nerves: II: Visual fields grossly normal,  III,IV, VI: ptosis not present, extra-ocular motions intact bilaterally, pupils equal, round, reactive to light and accommodation V,VII: smile symmetric, facial light touch sensation normal bilaterally VIII: hearing normal bilaterally IX,X: uvula rises symmetrically XI: bilateral shoulder shrug XII: midline tongue extension Motor: Right  : Upper extremity   5/5    Left:     Upper extremity   5/5  Lower extremity   5/5     Lower extremity   5/5 Tone and bulk:normal tone throughout; no atrophy noted Sensory: Pinprick and light touch intact throughout, bilaterally Deep Tendon Reflexes: 2+ and symmetric throughout Plantars: Right: downgoing   Left: downgoing Cerebellar: normal finger-to-nose, normal rapid alternating movements and normal heel-to-shin test Gait: not assessed      Lab Results: Basic Metabolic Panel: Recent Labs  Lab 05/04/19 1630 05/04/19 2301  NA 144  --   K 2.6*  --   CL 102  --   CO2 24  --   GLUCOSE 125*  --   BUN 10  --   CREATININE 1.17*  --   CALCIUM 10.1  --   MG  --  1.8    CBC: Recent Labs  Lab 05/04/19 1630  WBC 7.0  NEUTROABS 3.2  HGB 14.4  HCT 41.6  MCV 81.6  PLT 245    Coagulation Studies: Recent Labs    05/04/19 1630 05/04/19 1715  LABPROT 13.0 13.6  INR 1.0 1.1    Imaging: Ct Head Wo Contrast  Result Date: 05/04/2019 CLINICAL DATA:  Altered level of consciousness EXAM: CT HEAD  WITHOUT CONTRAST TECHNIQUE: Contiguous axial images were obtained from the base of the skull through the vertex without intravenous contrast. COMPARISON:  03/15/2019 FINDINGS: Brain: Area of decreased attenuation is noted in the left posterior parietal region consistent with the area of prior infarct from the prior study. No findings to suggest acute ischemia are seen. No hemorrhage or focal mass lesion is noted. Areas of prior lacunar infarcts are noted within the basal ganglia bilaterally. Vascular: No hyperdense vessel or unexpected calcification. Skull: Normal. Negative for fracture or focal lesion. Sinuses/Orbits: No acute finding. Other: None. IMPRESSION: Changes of chronic ischemia without acute abnormality. Electronically Signed   By: Alcide Clever M.D.   On: 05/04/2019 19:08   Mr Brain Wo Contrast  Result Date: 05/04/2019 CLINICAL DATA:  Right-sided weakness and speech abnormality.  EXAM: MRI HEAD WITHOUT CONTRAST TECHNIQUE: Multiplanar, multiecho pulse sequences of the brain and surrounding structures were obtained without intravenous contrast. COMPARISON:  Brain MRI 03/15/2019 FINDINGS: Brain: There is no acute infarct, acute hemorrhage or extra-axial collection. Early confluent hyperintense T2-weighted signal of the periventricular and deep white matter, most commonly due to chronic ischemic microangiopathy. There is an old posterior left parietal/temporal infarct. There is generalized atrophy without lobar predilection. Blood-sensitive sequences show multifocal chronic microhemorrhage and hemosiderin deposition in the posterior left MCA territory. The midline structures are normal. Vascular: Normal flow voids at the skull base Skull and upper cervical spine: The bone marrow signal of the cranium and upper cervical vertebrae is normal. There is no skull base lesion. The visualized upper cervical spinal cord is normal. Sinuses/Orbits: There is no paranasal sinus fluid level or advanced mucosal thickening. There is no mastoid or middle ear effusion. The orbits are normal. Other: None IMPRESSION: 1. No acute intracranial abnormality. 2. Generalized atrophy and chronic ischemic microangiopathy. 3. Old left posterior parietal/temporal infarct. Electronically Signed   By: Deatra Robinson M.D.   On: 05/04/2019 22:09     ASSESSMENT AND PLAN  73 y.o. female with past medical history significant for hypertension, diabetes mellitus, recent stroke with residual right-sided weakness, history of seizures presents to the emergency department with increased fatigue, slurring of speech that has since resolved.   Hypertensive emergency, less likely TIA  Recommendations Blood pressure goal less than 180 systolic, ideally around 160 systolic. Agree with restarting Coreg, amodipine and PRN hydralazine. Gradually bring to normotension. No need to repeat vascular imaging MRI Brain to r/o acute stroke:  reviewed no acute stroke.  Frequent neurochecks  Addendum Appears blood pressures elevated >180, has not received PRN blood pressure medications. Advised nurse to give medicine.       [facility] Pager Number [phone]

## 2019-05-05 NOTE — Discharge Summary (Signed)
Physician Discharge Summary  Rebecca Gallagher DOB: 12/27/1945 DOA: 05/04/2019  PCP: Dois Davenportichter, Karen L, MD  Admit date: 05/04/2019 Discharge date: 05/05/2019  Admitted From: home Discharge disposition: home   Recommendations for Outpatient Follow-Up:   1. Monitor blood pressure, adjust meds as needed 2. Home health PT  Discharge Diagnosis:   Principal Problem:   Hypertensive emergency Active Problems:   TIA (transient ischemic attack)   Hypertension   Hypokalemia   Seizure disorder (HCC)   Depression   Type 2 diabetes mellitus with neurological complications (HCC)    Discharge Condition: Improved.  Diet recommendation: Low sodium, heart healthy.  Carbohydrate-modified  Wound care: None.  Code status: Full.   History of Present Illness:   Rebecca CleverlyBobbie Peckenpaugh is a 73 y.o. F with hx DM, HTN, recent CVA with residual right-sided weakness who presents with abnormal speech, malaise.  The patient was discharged from rehab after her stroke 3 weeks ago, had been back at home, doing well until today around 2 PM she was watching television with her daughter when all of a sudden she said "I do not feel well", then subsequently was speaking in a garbled manner, and slurring her words.  She appeared unwell, and so the daughter called EMS.  In the ER, temp 90 47F, blood pressure 230/89, K2.6, WBC 7K, creatinine 1.17 (from baseline 0.8-1.1).  CT of the head was unremarkable.  UDS negative.  ECG showed old LBBB.  She was given IV labetalol and IV potassium in the hospital service were asked to evaluate for TIA.  Of note, the patient's carvedilol dose was cut in half during the nursing home stay due to bradycardia.  [facility] pressure was over 200.   Hospital Course by Problem:   hypertensive encephalopathy/hypertensive emergency Patient presents new neurologic deficit and blood pressure 230/89 mmHg.   Given her mental  status is back to baseline, will use oral agents -MRI brain normal -neurology consult appreciated -Start amlodipine 2.5 mg  -Continue carvedilol 3.125 mg BID (this is the reduced dose from prior, given low HR) -Continue olmesartan 40 mg Daily  Hypokalemia -repleated  Diabetes -Continue Invokana -Sliding scale corrections as needed  Cerebrovascular disease Seizure prevention Had recent L MCA stroke with residual right sided weakness.  EEG during that admission showed epileptic focus, started on prophylactic Keppra -Continue aspirin, Crestor -Continue Keppra  Depression -Continue sertraline    Medical Consultants:      Discharge Exam:   Vitals:   05/05/19 0859 05/05/19 1301  BP: (!) 148/49 (!) 134/48  Pulse: (!) 57 (!) 55  Resp: 18   Temp: 98.2 F (36.8 C) 98.1 F (36.7 C)  SpO2: 99% 98%   Vitals:   05/05/19 0651 05/05/19 0806 05/05/19 0859 05/05/19 1301  BP: (!) 143/48 (!) 156/47 (!) 148/49 (!) 134/48  Pulse: (!) 51 (!) 53 (!) 57 (!) 55  Resp:  18 18   Temp:  97.9 F (36.6 C) 98.2 F (36.8 C) 98.1 F (36.7 C)  TempSrc:  Oral Oral Oral  SpO2: 97% 98% 99% 98%  Weight:      Height:        General exam: Appears calm and comfortable.  The results of significant diagnostics from this hospitalization (including imaging, microbiology, ancillary and laboratory) are listed below for reference.     Procedures and Diagnostic Studies:   Ct Head Wo Contrast  Result Date: 05/04/2019 CLINICAL DATA:  Altered  level of consciousness EXAM: CT HEAD WITHOUT CONTRAST TECHNIQUE: Contiguous axial images were obtained from the base of the skull through the vertex without intravenous contrast. COMPARISON:  03/15/2019 FINDINGS: Brain: Area of decreased attenuation is noted in the left posterior parietal region consistent with the area of prior infarct from the prior study. No findings to suggest acute ischemia are seen. No hemorrhage or focal mass lesion is noted. Areas of  prior lacunar infarcts are noted within the basal ganglia bilaterally. Vascular: No hyperdense vessel or unexpected calcification. Skull: Normal. Negative for fracture or focal lesion. Sinuses/Orbits: No acute finding. Other: None. IMPRESSION: Changes of chronic ischemia without acute abnormality. Electronically Signed   By: Alcide Clever M.D.   On: 05/04/2019 19:08   Mr Brain Wo Contrast  Result Date: 05/04/2019 CLINICAL DATA:  Right-sided weakness and speech abnormality. EXAM: MRI HEAD WITHOUT CONTRAST TECHNIQUE: Multiplanar, multiecho pulse sequences of the brain and surrounding structures were obtained without intravenous contrast. COMPARISON:  Brain MRI 03/15/2019 FINDINGS: Brain: There is no acute infarct, acute hemorrhage or extra-axial collection. Early confluent hyperintense T2-weighted signal of the periventricular and deep white matter, most commonly due to chronic ischemic microangiopathy. There is an old posterior left parietal/temporal infarct. There is generalized atrophy without lobar predilection. Blood-sensitive sequences show multifocal chronic microhemorrhage and hemosiderin deposition in the posterior left MCA territory. The midline structures are normal. Vascular: Normal flow voids at the skull base Skull and upper cervical spine: The bone marrow signal of the cranium and upper cervical vertebrae is normal. There is no skull base lesion. The visualized upper cervical spinal cord is normal. Sinuses/Orbits: There is no paranasal sinus fluid level or advanced mucosal thickening. There is no mastoid or middle ear effusion. The orbits are normal. Other: None IMPRESSION: 1. No acute intracranial abnormality. 2. Generalized atrophy and chronic ischemic microangiopathy. 3. Old left posterior parietal/temporal infarct. Electronically Signed   By: Deatra Robinson M.D.   On: 05/04/2019 22:09     Labs:   Basic Metabolic Panel: Recent Labs  Lab 05/04/19 1630 05/04/19 2301 05/05/19 0336  NA 144   --  144  K 2.6*  --  3.8  CL 102  --  110  CO2 24  --  19*  GLUCOSE 125*  --  93  BUN 10  --  12  CREATININE 1.17*  --  1.11*  CALCIUM 10.1  --  9.4  MG  --  1.8  --    GFR Estimated Creatinine Clearance: 29.5 mL/min (A) (by C-G formula based on SCr of 1.11 mg/dL (H)). Liver Function Tests: Recent Labs  Lab 05/04/19 1630  AST 33  ALT 25  ALKPHOS 87  BILITOT 0.9  PROT 6.5  ALBUMIN 4.0   No results for input(s): LIPASE, AMYLASE in the last 168 hours. No results for input(s): AMMONIA in the last 168 hours. Coagulation profile Recent Labs  Lab 05/04/19 1630 05/04/19 1715  INR 1.0 1.1    CBC: Recent Labs  Lab 05/04/19 1630 05/05/19 0336  WBC 7.0 7.5  NEUTROABS 3.2  --   HGB 14.4 13.0  HCT 41.6 38.0  MCV 81.6 82.1  PLT 245 246   Cardiac Enzymes: No results for input(s): CKTOTAL, CKMB, CKMBINDEX, TROPONINI in the last 168 hours. BNP: Invalid input(s): POCBNP CBG: Recent Labs  Lab 05/04/19 1609 05/04/19 2354 05/05/19 0617 05/05/19 1201  GLUCAP 123* 91 77 87   D-Dimer No results for input(s): DDIMER in the last 72 hours. Hgb A1c No results for  input(s): HGBA1C in the last 72 hours. Lipid Profile No results for input(s): CHOL, HDL, LDLCALC, TRIG, CHOLHDL, LDLDIRECT in the last 72 hours. Thyroid function studies No results for input(s): TSH, T4TOTAL, T3FREE, THYROIDAB in the last 72 hours.  Invalid input(s): FREET3 Anemia work up No results for input(s): VITAMINB12, FOLATE, FERRITIN, TIBC, IRON, RETICCTPCT in the last 72 hours. Microbiology Recent Results (from the past 240 hour(s))  SARS CORONAVIRUS 2 (TAT 6-24 HRS) Nasopharyngeal Nasopharyngeal Swab     Status: None   Collection Time: 05/04/19 10:00 PM   Specimen: Nasopharyngeal Swab  Result Value Ref Range Status   SARS Coronavirus 2 NEGATIVE NEGATIVE Final    Comment: (NOTE) SARS-CoV-2 target nucleic acids are NOT DETECTED. The SARS-CoV-2 RNA is generally detectable in upper and  lower respiratory specimens during the acute phase of infection. Negative results do not preclude SARS-CoV-2 infection, do not rule out co-infections with other pathogens, and should not be used as the sole basis for treatment or other patient management decisions. Negative results must be combined with clinical observations, patient history, and epidemiological information. The expected result is Negative. Fact Sheet for Patients: SugarRoll.be Fact Sheet for Healthcare Providers: https://www.woods-mathews.com/ This test is not yet approved or cleared by the [place] FDA and  has been authorized for detection and/or diagnosis of SARS-CoV-2 by FDA under an Emergency Use Authorization (EUA). This EUA will remain  in effect (meaning this test can be used) for the duration of the COVID-19 declaration under Section 56 4(b)(1) of the Act, 21 U.S.C. section 360bbb-3(b)(1), unless the authorization is terminated or revoked sooner. Performed at [facility], [place] [address], [place], [place] [address]      Discharge Instructions:   Discharge Instructions    Diet - low sodium heart healthy   Complete by: As directed    Discharge instructions   Complete by: As directed    Resume home health Coreg is 3.125mg  2/x a day (this lowers your HR and BP) norvasc (amilodapine) was added for BP control  (you have gotten this in the hospital and tolerated well) Close monitoring of your BP   Increase activity slowly   Complete by: As directed      Allergies as of 05/05/2019      Reactions   Hydrocodone-acetaminophen Palpitations   Pregabalin Palpitations   Valsartan Palpitations   Yellow Dye Itching   RJJ:OACZYS Dye+nisoldipine   Niacin Swelling   Acetaminophen    Reported by daughter    Interferons    Beta-1b   Nisoldipine    Losartan Palpitations      Medication List    TAKE these medications   amLODipine 2.5 MG tablet Commonly  known as: NORVASC Take 1 tablet (2.5 mg total) by mouth daily. Start taking on: May 06, 2019   ARTIFICIAL TEARS OP Place 2 drops into both eyes daily.   aspirin EC 81 MG tablet Take 81 mg by mouth daily.   carvedilol 3.125 MG tablet Commonly known as: COREG Take 1 tablet (3.125 mg total) by mouth 2 (two) times daily.   cetirizine 10 MG tablet Commonly known as: ZYRTEC Take 1 tablet (10 mg total) by mouth daily. For angioedema   Invokana 100 MG Tabs tablet Generic drug: canagliflozin Take 1-1/2 tablets equal 150 mg p.o. daily before the first meal of the day What changed:   how much to take  how to take this  when to take this  additional instructions   levETIRAcetam 500 MG tablet  Commonly known as: KEPPRA Take 1 tablet (500 mg total) by mouth 2 (two) times daily.   olmesartan 40 MG tablet Commonly known as: BENICAR Take 1 tablet (40 mg total) by mouth daily.   omeprazole 20 MG capsule Commonly known as: PRILOSEC Take 1 capsule (20 mg total) by mouth daily. 30 minutes before morning meal   rosuvastatin 20 MG tablet Commonly known as: CRESTOR Take 1 tablet (20 mg total) by mouth daily.   sertraline 50 MG tablet Commonly known as: ZOLOFT Take 1 tablet (50 mg total) by mouth at bedtime.         Time coordinating discharge: 25 min  Signed:  Joseph Art DO  [facility] 05/05/2019, 1:41 PM

## 2019-05-05 NOTE — Evaluation (Signed)
Clinical/Bedside Swallow Evaluation Patient Details  Name: Rebecca Gallagher MRN: [id number] Date of Birth: 24-Nov-1945  Today's Date: 05/05/2019 Time:        Past Medical History:  Past Medical History:  Diagnosis Date  . Depression   . Diabetes mellitus without complication (HCC)   . High cholesterol   . Hypertension   . Seizures (HCC)   . Stroke [facility])    Past Surgical History:  Past Surgical History:  Procedure Laterality Date  . ABDOMINAL HYSTERECTOMY    . CATARACT EXTRACTION    . EYE SURGERY    . LOOP RECORDER INSERTION N/A 03/17/2019   Procedure: LOOP RECORDER INSERTION;  Surgeon: Hillis Range, MD;  Location: [facility] INVAS[facility] CV LAB;  Service: Cardiovascular;  Laterality: N/A;  . TOE AMPUTATION     HPI:  73 y.o.femalewith past medical history significant for hypertension, diabetes mellitus, recent L posterior MCA infarct with residual right-sided weakness, history of seizures presents to the emergency department with increased fatigue, slurring of speech that has since resolved. Patient presentation likely secondary to hypertensive emergency.   Assessment / Plan / Recommendation Clinical Impression  Pt failed 3 oz water swallow with RN due to coughing and with SLP was again noted to struggle with taking large consecutive swallows. Pt reports she typically takes one sip at a time and when allowed to drink at her own pace, showed no further signs of aspiration. Given that there is no acute impairment and daughter confirms that pt does have an occaional cough with meals (once or twice a month), Pt is expected to continue eating and drinking  at her baseline function which is typically adequate for her age. Discussed signs of aspiration with daughter and advised her to monitor her mother and f/u with primary care if she notices an increased frequency of coughing when drinking and to be aware that function could change with acute illness. No further SLP interventions needed acutely will sign  off.  SLP Visit Diagnosis: Dysphagia, oropharyngeal phase (R13.12)    Aspiration Risk  Mild aspiration risk    Diet Recommendation Regular;Thin liquid   Liquid Administration via: Cup;Straw Medication Administration: Whole meds with liquid Supervision: Patient able to self feed Compensations: Slow rate;Small sips/bites Postural Changes: Seated upright at 90 degrees    Other  Recommendations Oral Care Recommendations: Oral care BID   Follow up Recommendations None      Frequency and Duration            Prognosis        Swallow Study   General HPI: 73 y.o.femalewith past medical history significant for hypertension, diabetes mellitus, recent L posterior MCA infarct with residual right-sided weakness, history of seizures presents to the emergency department with increased fatigue, slurring of speech that has since resolved. Patient presentation likely secondary to hypertensive emergency. Type of Study: Bedside Swallow Evaluation Previous Swallow Assessment: none in chart, daughter reports many tests for reflux Diet Prior to this Study: NPO Temperature Spikes Noted: No Respiratory Status: Room air History of Recent Intubation: No Behavior/Cognition: Alert;Cooperative;Pleasant mood Oral Cavity Assessment: Within Functional Limits Oral Care Completed by SLP: No Oral Cavity - Dentition: Adequate natural dentition Vision: Functional for self-feeding Self-Feeding Abilities: Able to feed self Patient Positioning: Upright in bed Baseline Vocal Quality: Normal Volitional Cough: Strong Volitional Swallow: Able to elicit    Oral/Motor/Sensory Function Overall Oral Motor/Sensory Function: Within functional limits   Ice Chips     Thin Liquid Thin Liquid: Impaired Presentation: Straw;Cup;Self Fed Pharyngeal  Phase  Impairments: Cough - Immediate    Nectar Thick Nectar Thick Liquid: Not tested   Honey Thick Honey Thick Liquid: Not tested   Puree Puree: Within functional limits    Solid     Solid: Within functional limits Presentation: Self Fed      , Rebecca Gallagher 05/05/2019,2:00 PM

## 2019-06-01 ENCOUNTER — Encounter: Payer: Self-pay | Admitting: Internal Medicine

## 2019-06-02 ENCOUNTER — Ambulatory Visit (INDEPENDENT_AMBULATORY_CARE_PROVIDER_SITE_OTHER): Payer: Medicare HMO | Admitting: *Deleted

## 2019-06-02 DIAGNOSIS — I63512 Cerebral infarction due to unspecified occlusion or stenosis of left middle cerebral artery: Secondary | ICD-10-CM | POA: Diagnosis not present

## 2019-06-02 LAB — CUP PACEART REMOTE DEVICE CHECK
Date Time Interrogation Session: 20201224120137
Implantable Pulse Generator Implant Date: 20201008

## 2019-06-05 NOTE — Progress Notes (Signed)
ILR remote 

## 2019-06-13 ENCOUNTER — Other Ambulatory Visit: Payer: Self-pay | Admitting: Internal Medicine

## 2019-07-04 ENCOUNTER — Ambulatory Visit (INDEPENDENT_AMBULATORY_CARE_PROVIDER_SITE_OTHER): Payer: Medicare HMO | Admitting: *Deleted

## 2019-07-04 DIAGNOSIS — I63512 Cerebral infarction due to unspecified occlusion or stenosis of left middle cerebral artery: Secondary | ICD-10-CM | POA: Diagnosis not present

## 2019-07-04 LAB — CUP PACEART REMOTE DEVICE CHECK
Date Time Interrogation Session: 20210124230124
Implantable Pulse Generator Implant Date: 20201008

## 2019-07-07 ENCOUNTER — Ambulatory Visit: Payer: Medicare HMO

## 2019-07-14 ENCOUNTER — Ambulatory Visit: Payer: Medicare HMO | Attending: Internal Medicine

## 2019-07-14 DIAGNOSIS — Z23 Encounter for immunization: Secondary | ICD-10-CM

## 2019-07-14 NOTE — Progress Notes (Signed)
   Covid-19 Vaccination Clinic  Name:  Tagen Brethauer    MRN: [id number] DOB: 1945-09-07  07/14/2019  Ms. Rooks was observed post Covid-19 immunization for 15 minutes without incidence. She was provided with Vaccine Information Sheet and instruction to access the V-Safe system.   Ms. Stitt was instructed to call 911 with any severe reactions post vaccine: Marland Kitchen Difficulty breathing  . Swelling of your face and throat  . A fast heartbeat  . A bad rash all over your body  . Dizziness and weakness    Immunizations Administered    Name Date Dose VIS Date Route   Pfizer COVID-19 Vaccine 07/14/2019  5:11 PM 0.3 mL 05/20/2019 Intramuscular   Manufacturer: [employer], [employer]   Lot: [id number]   NDC: [address]-1

## 2019-07-18 ENCOUNTER — Ambulatory Visit: Payer: Medicare HMO

## 2019-07-20 ENCOUNTER — Telehealth: Payer: Self-pay | Admitting: Adult Health

## 2019-07-20 NOTE — Telephone Encounter (Signed)
I called patient and LVM to reschedule 2/25 appointment due to NP out of office. Requested patient call back to reschedule. 

## 2019-08-04 ENCOUNTER — Ambulatory Visit (INDEPENDENT_AMBULATORY_CARE_PROVIDER_SITE_OTHER): Payer: Medicare HMO | Admitting: *Deleted

## 2019-08-04 ENCOUNTER — Ambulatory Visit: Payer: Medicare HMO | Admitting: Adult Health

## 2019-08-04 DIAGNOSIS — I63512 Cerebral infarction due to unspecified occlusion or stenosis of left middle cerebral artery: Secondary | ICD-10-CM

## 2019-08-04 LAB — CUP PACEART REMOTE DEVICE CHECK
Date Time Interrogation Session: 20210224230232
Implantable Pulse Generator Implant Date: 20201008

## 2019-08-05 NOTE — Progress Notes (Signed)
ILR Remote 

## 2019-08-09 ENCOUNTER — Ambulatory Visit: Payer: Medicare HMO | Attending: Internal Medicine

## 2019-08-09 DIAGNOSIS — Z23 Encounter for immunization: Secondary | ICD-10-CM | POA: Insufficient documentation

## 2019-08-09 NOTE — Progress Notes (Signed)
   Covid-19 Vaccination Clinic  Name:  Rylinn Linzy    MRN: [id number] DOB: Dec 28, 1945  08/09/2019  Ms. Kantor was observed post Covid-19 immunization for 15 minutes without incident. She was provided with Vaccine Information Sheet and instruction to access the V-Safe system.   Ms. Andalon was instructed to call 911 with any severe reactions post vaccine: Marland Kitchen Difficulty breathing  . Swelling of face and throat  . A fast heartbeat  . A bad rash all over body  . Dizziness and weakness   Immunizations Administered    Name Date Dose VIS Date Route   Pfizer COVID-19 Vaccine 08/09/2019 12:41 PM 0.3 mL 05/20/2019 Intramuscular   Manufacturer: [employer], [employer]   Lot: [id number]   NDC: [address]-5

## 2019-08-22 ENCOUNTER — Other Ambulatory Visit: Payer: Self-pay | Admitting: Adult Health

## 2019-09-05 ENCOUNTER — Ambulatory Visit (INDEPENDENT_AMBULATORY_CARE_PROVIDER_SITE_OTHER): Payer: Medicare HMO | Admitting: *Deleted

## 2019-09-05 DIAGNOSIS — I63512 Cerebral infarction due to unspecified occlusion or stenosis of left middle cerebral artery: Secondary | ICD-10-CM

## 2019-09-05 LAB — CUP PACEART REMOTE DEVICE CHECK
Date Time Interrogation Session: 20210327230414
Implantable Pulse Generator Implant Date: 20201008

## 2019-09-06 NOTE — Progress Notes (Signed)
ILR Remote 

## 2019-09-14 ENCOUNTER — Ambulatory Visit (INDEPENDENT_AMBULATORY_CARE_PROVIDER_SITE_OTHER): Payer: Medicare HMO | Admitting: Adult Health

## 2019-09-14 ENCOUNTER — Other Ambulatory Visit: Payer: Self-pay

## 2019-09-14 ENCOUNTER — Encounter: Payer: Self-pay | Admitting: Adult Health

## 2019-09-14 VITALS — BP 142/64 | HR 50 | Temp 97.4°F | Ht <= 58 in | Wt 107.0 lb

## 2019-09-14 DIAGNOSIS — E1159 Type 2 diabetes mellitus with other circulatory complications: Secondary | ICD-10-CM

## 2019-09-14 DIAGNOSIS — R9401 Abnormal electroencephalogram [EEG]: Secondary | ICD-10-CM | POA: Diagnosis not present

## 2019-09-14 DIAGNOSIS — I10 Essential (primary) hypertension: Secondary | ICD-10-CM

## 2019-09-14 DIAGNOSIS — I639 Cerebral infarction, unspecified: Secondary | ICD-10-CM | POA: Diagnosis not present

## 2019-09-14 DIAGNOSIS — E785 Hyperlipidemia, unspecified: Secondary | ICD-10-CM

## 2019-09-14 MED ORDER — LEVETIRACETAM ER 750 MG PO TB24: 750.0000 mg | ORAL_TABLET | Freq: Every day | ORAL | 0 refills | Status: DC

## 2019-09-14 MED ORDER — LEVETIRACETAM ER 500 MG PO TB24: 500.0000 mg | ORAL_TABLET | Freq: Every day | ORAL | 3 refills | Status: DC

## 2019-09-14 NOTE — Patient Instructions (Signed)
Continue aspirin 81 mg daily  and atorvastatin for secondary stroke prevention  Continue to follow up with PCP regarding cholesterol, blood pressure and diabetes management   derease keppra dosage - start taking XR 750mg  nightly for 4 weeks. If doing well, plesae start XR 500mg  nightly for 4 weeks. At that time, would recommend obtaining EEG to ensure no abnormal brain activity. We will discuss further decreasing at that time.   Maintain strict control of hypertension with blood pressure goal below 130/90, diabetes with hemoglobin A1c goal below 6.5% and cholesterol with LDL cholesterol (bad cholesterol) goal below 70 mg/dL. I also advised the patient to eat a healthy diet with plenty of whole grains, cereals, fruits and vegetables, exercise regularly and maintain ideal body weight.  Followup in the future with me in 3 months or call earlier if needed       Thank you for coming to see at [facility]. I hope we have been able to provide you high quality care today.  You may receive a patient satisfaction survey over the next few weeks. We would appreciate your feedback and comments so that we may continue to improve ourselves and the health of our patients.

## 2019-09-14 NOTE — Progress Notes (Signed)
I agree with the above plan 

## 2019-09-14 NOTE — Progress Notes (Signed)
[facility] [address] [place]. [place] [address] [phone]       STROKE FOLLOW UP NOTE  Ms. Rebecca Gallagher Date of Birth:  January 17, 1946 Medical Record Number:  [id number]   Reason for Referral: stroke follow up    CHIEF COMPLAINT:  Chief Complaint  Patient presents with  . Follow-up    pt is with her daughter, rm 61. following up today. states there are no concerns    HPI:  Today, 09/14/2019, Rebecca Gallagher is being seen for follow-up regarding cryptogenic left posterior MCA stroke in 03/2019 accompanied by her daughter.  Residual stroke deficits mild right hand decreased dexterity and gait impairment but overall greatly improving.  Use of cane outside but no AD at home - previously using RW. Continues on aspirin 81 mg daily and Crestor 20 mg daily for secondary stroke prevention without side effects.  She does have follow-up with PCP next week and plans on obtaining lab work at that time.  Blood pressure today 142/64.  Loop recorder has not shown atrial fibrillation thus far.  Continues on Keppra 500 mg twice daily without recent seizure activity.  Daughter questions whether ongoing use is needed due to concerns of increased fatigue and no actual seizure activity witnessed.  No further concerns at this time.    History: Provided for reference purposes only Stroke admission 03/15/2019: Ms. Rebecca Gallagher is a 74 y.o. female with history of HTN, HLD, DM, depression presented on 03/15/2019 to [facility] following a presumed fall who developed mixed aphasia and SBP 200s while awaiting admission.  She was transferred to [facility] for further work-up.  Stroke work-up revealed left posterior MCA infarct embolic as evidenced on MRI secondary to unknown source concerning for occult A. fib.  CTA showed hypodensity left occipital parietal lobe, bilateral ICA bifurcation arthrosclerosis, bilateral cavernous moderate stenosis, left MCA bifurcation mild stenosis and right VA origin with  mild stenosis.  CT perfusion negative.  In addition to acute infarct, MRI also showed evidence of small vessel disease.  LE Doppler negative DVT.  2D echo normal EF without cardiac source of embolus identified.  Loop recorder placed to rule out atrial fibrillation.  EEG showed left temporal sharps likely due to left MCA infarct and initiated Keppra 500 mg twice daily for seizure prophylaxis.  LDL 144.  A1c 7.5.  Recommended DAPT for 3 weeks and aspirin alone.  HTN stabilized and recommended long-term BP goal normotensive range.  Previously on Crestor and change to atorvastatin 40 mg daily due to continued elevated LDL.  Uncontrolled DM and recommend close PCP follow-up.  Other stroke risk factors include advanced age and EtOH use but no prior history of stroke.  Therapies recommended CIR but was discharged to SNF due to family wishes for ongoing therapy with residual right hemiparesis, facial droop and speech difficulty.  Initial visit 04/28/2019 JM: Rebecca Gallagher is a 74 year old female who is being seen today for stroke follow-up.  She has since been discharged from SNF and has returned home on 04/12/2019.  She has made great improvements with residual mild decreased right hand dexterity and hip flexor weakness and mild dysarthria.  She has been working with home health physical therapy and potentially occupational therapy.  Evaluated by speech therapy who did not feel as though speech therapy indicated.  She continues to ambulate with rolling walker without any recent falls.  Continues to reside with her daughter.  Currently on sertraline 50 mg daily with depression stable.  Completed 3  weeks DAPT and continues on aspirin alone without bleeding or bruising.  Continues on Crestor 20 mg daily without myalgias.  Blood pressure today 130/62.  She has continued on Keppra 500 mg twice daily without seizure activity or side effects.  Daughter questions need of ongoing use of Keppra.  Loop recorder in place but  unfortunately missed cardiology follow-up visit as daughter was not aware of appointment.  No reports on loop recorder at this time.  No further concerns at this time.       ROS:   14 system review of systems performed and negative with exception of gait impairment, joint pain, fatigue  PMH:  Past Medical History:  Diagnosis Date  . Depression   . Diabetes mellitus without complication (HCC)   . High cholesterol   . Hypertension   . Seizures (HCC)   . Stroke [facility])     PSH:  Past Surgical History:  Procedure Laterality Date  . ABDOMINAL HYSTERECTOMY    . CATARACT EXTRACTION    . EYE SURGERY    . LOOP RECORDER INSERTION N/A 03/17/2019   Procedure: LOOP RECORDER INSERTION;  Surgeon: Hillis Range, MD;  Location: [facility] INVASIVE CV LAB;  Service: Cardiovascular;  Laterality: N/A;  . TOE AMPUTATION      Social History:  Social History   Socioeconomic History  . Marital status: Divorced    Spouse name: Not on file  . Number of children: 3  . Years of education: Not on file  . Highest education level: GED or equivalent  Occupational History  . Not on file  Tobacco Use  . Smoking status: Former Smoker    Quit date: 04/27/1969    Years since quitting: 50.4  . Smokeless tobacco: Never Used  Substance and Sexual Activity  . Alcohol use: Yes    Comment: occ  . Drug use: Never  . Sexual activity: Not on file  Other Topics Concern  . Not on file  Social History Narrative   04/28/19 living with dgtr   Former smoker.  Single.  Occasional drinker.    Coffee 1 c daily   Social Determinants of Health   Financial Resource Strain:   . Difficulty of Paying Living Expenses:   Food Insecurity:   . Worried About Programme researcher, broadcasting/film/video in the Last Year:   . Barista in the Last Year:   Transportation Needs:   . Freight forwarder (Medical):   Marland Kitchen Lack of Transportation (Non-Medical):   Physical Activity:   . Days of Exercise per Week:   . Minutes of Exercise per Session:    Stress:   . Feeling of Stress :   Social Connections:   . Frequency of Communication with Friends and Family:   . Frequency of Social Gatherings with Friends and Family:   . Attends Religious Services:   . Active Member of Clubs or Organizations:   . Attends Banker Meetings:   Marland Kitchen Marital Status:   Intimate Partner Violence:   . Fear of Current or Ex-Partner:   . Emotionally Abused:   Marland Kitchen Physically Abused:   . Sexually Abused:     Family History:  Family History  Problem Relation Age of Onset  . Heart failure Mother   . Hypertension Mother   . Stroke Mother   . Heart attack Father   . Hypertension Father   . Hypertension Sister   . Hypertension Brother   . Stroke Brother   . Heart attack Brother  Medications:   Current Outpatient Medications on File Prior to Visit  Medication Sig Dispense Refill  . amLODipine (NORVASC) 2.5 MG tablet Take 1 tablet (2.5 mg total) by mouth daily. 30 tablet 0  . aspirin EC 81 MG tablet Take 81 mg by mouth daily.    . carvedilol (COREG) 3.125 MG tablet Take 3.125 mg by mouth 2 (two) times daily with a meal.    . INVOKANA 100 MG TABS tablet Take 1-1/2 tablets equal 150 mg p.o. daily before the first meal of the day (Patient taking differently: Take 150 mg by mouth daily before breakfast. ) 45 tablet 0  . levocetirizine (XYZAL) 5 MG tablet     . levothyroxine (SYNTHROID) 25 MCG tablet     . olmesartan (BENICAR) 40 MG tablet Take 1 tablet (40 mg total) by mouth daily. 30 tablet 0  . omeprazole (PRILOSEC) 20 MG capsule Take 1 capsule (20 mg total) by mouth daily. 30 minutes before morning meal 30 capsule 0  . rosuvastatin (CRESTOR) 20 MG tablet Take 1 tablet (20 mg total) by mouth daily. 30 tablet 0  . sertraline (ZOLOFT) 50 MG tablet Take 1 tablet (50 mg total) by mouth at bedtime. 30 tablet 0  . Vitamin D, Ergocalciferol, (DRISDOL) 1.25 MG (50000 UNIT) CAPS capsule Take 50,000 Units by mouth every 7 (seven) days.    .  Carboxymethylcellulose Sodium (ARTIFICIAL TEARS OP) Place 2 drops into both eyes daily.     No current facility-administered medications on file prior to visit.    Allergies:   Allergies  Allergen Reactions  . Hydrocodone-Acetaminophen Palpitations  . Pregabalin Palpitations  . Valsartan Palpitations  . Yellow Dye Itching    HDQ:QIWLNL Dye+nisoldipine  . Niacin Swelling  . Acetaminophen     Reported by daughter   . Interferons     Beta-1b  . Nisoldipine   . Losartan Palpitations     Physical Exam  Vitals:   09/14/19 0723  BP: (!) 142/64  Pulse: (!) 50  Temp: (!) 97.4 F (36.3 C)  Weight: 107 lb (48.5 kg)  Height: 4\' 8"  (1.422 m)   Body mass index is 23.99 kg/m. No exam data present  General: well developed, well nourished, very pleasant elderly Caucasian female, seated, in no evident distress Head: head normocephalic and atraumatic.   Neck: supple with no carotid or supraclavicular bruits Cardiovascular: regular rate and rhythm, no murmurs Musculoskeletal: no deformity Skin:  no rash/petichiae Vascular:  Normal pulses all extremities   Neurologic Exam Mental Status: Awake and fully alert.  Slight slowing of speech but no evidence of aphasia or dysarthria.  Oriented to place and time. Recent and remote memory intact with only occasional difficulty. Attention span, concentration and fund of knowledge appropriate. Mood and affect appropriate.  Cranial Nerves: Pupils equal, briskly reactive to light. Extraocular movements full without nystagmus. Visual fields full to confrontation. Hearing intact. Facial sensation intact.  Mild right lower facial weakness Motor: Normal bulk and tone.  Full strength in all tested extremities except mild weakness hip flexors bilaterally Sensory.: intact to touch , pinprick , position and vibratory sensation.  Coordination: Rapid alternating movements normal in all extremities. Finger-to-nose shows mild right hand apraxia and heel-to-shin  performed accurately bilaterally. Gait and Station: Arises from chair without difficulty. Stance is normal. Gait demonstrates normal stride length and balance with use of cane Reflexes: 1+ and symmetric. Toes downgoing.       ASSESSMENT: Jersie Beel is a 74 y.o. year old female presented  after a fall who developed mixed aphasia and elevated BP on 03/15/2019 with stroke work-up revealing left posterior MCA infarct embolic secondary to unknown source concerning for occult A. fib therefore loop recorder placed. Vascular risk factors include HTN, HLD, DM, intracranial stenosis and abnormal EEG (left temporal epileptogenicity and cortical dysfunction likely due to left MCA stroke).  Has been doing well from a stroke standpoint with mild right hand apraxia, mild right facial weakness and gait impairment with use of cane.  Ongoing use of Keppra 500 mg twice daily for seizure prophylaxis without evidence of seizure activity -daughter questions possible discontinuation    PLAN:  1. Cryptogenic left MCA stroke:  -Continue aspirin 81 mg daily  and Crestor for secondary stroke prevention.  -Loop recorder will continue to be monitored for possible atrial fibrillation -Maintain strict control of hypertension with blood pressure goal below 130/90, diabetes with hemoglobin A1c goal below 6.5% and cholesterol with LDL cholesterol (bad cholesterol) goal below 70 mg/dL.  I also advised the patient to eat a healthy diet with plenty of whole grains, cereals, fruits and vegetables, exercise regularly with at least 30 minutes of continuous activity daily and maintain ideal body weight.   2. EEG abnormality:  -Discussion with patient and daughter regarding possibility of seizure activity with discontinuing Keppra.  Due to daily fatigue and lack of seizures, patient and daughter would like to proceed with decreasing dosage and verbalized understanding of possible recurrent seizure activity -Currently on Keppra 500 mg  twice daily.  Recommend initiating Keppra XR 750 mg nightly for 4 weeks and if doing well, further decrease to Keppra XR 500 mg nightly for 4 weeks.  If no evidence of seizure activity, would recommend obtaining repeat EEG at that time.  If stable, will discuss further decreasing dosage. -Discussion regarding possible seizure symptoms and to notify office with any questions or concerns 3. HTN: Stable.  Continue to follow with PCP for monitoring management 4. HLD: Continuation of Crestor and request repeat lipid panel with PCP next week at follow-up visit 5. DMII: Continue to follow with PCP for monitoring and management     Follow up in 3 months or call earlier if needed   I spent 35 minutes of face-to-face and non-face-to-face time with patient and daughter.  This included previsit chart review, lab review, study review, order entry, electronic health record documentation, patient education regarding prior stroke, abnormal EEG, possible recurrent seizure activity, importance of managing stroke risk factors and answered all questions to patient and daughter satisfaction   Frann Rider, AGNP-BC  [facility] [address] [place], [place] [address]  Phone [phone] Fax [phone] Note: This document was prepared with digital dictation and possible smart phrase technology. Any transcriptional errors that result from this process are unintentional.

## 2019-09-28 ENCOUNTER — Encounter: Payer: Self-pay | Admitting: Adult Health

## 2019-09-28 MED ORDER — LEVETIRACETAM ER 500 MG PO TB24: 500.0000 mg | ORAL_TABLET | Freq: Every day | ORAL | 0 refills | Status: AC

## 2019-10-06 LAB — CUP PACEART REMOTE DEVICE CHECK
Date Time Interrogation Session: 20210428230547
Implantable Pulse Generator Implant Date: 20201008

## 2019-10-10 ENCOUNTER — Ambulatory Visit (INDEPENDENT_AMBULATORY_CARE_PROVIDER_SITE_OTHER): Payer: Medicare HMO | Admitting: *Deleted

## 2019-10-10 DIAGNOSIS — I63512 Cerebral infarction due to unspecified occlusion or stenosis of left middle cerebral artery: Secondary | ICD-10-CM | POA: Diagnosis not present

## 2019-10-11 NOTE — Progress Notes (Signed)
Carelink Summary Report / Loop Recorder 

## 2019-11-08 ENCOUNTER — Ambulatory Visit (INDEPENDENT_AMBULATORY_CARE_PROVIDER_SITE_OTHER): Payer: Medicare HMO | Admitting: *Deleted

## 2019-11-08 DIAGNOSIS — G459 Transient cerebral ischemic attack, unspecified: Secondary | ICD-10-CM

## 2019-11-08 NOTE — Progress Notes (Signed)
Carelink Summary Report / Loop Recorder 

## 2019-11-09 LAB — CUP PACEART REMOTE DEVICE CHECK
Date Time Interrogation Session: 20210529230040
Implantable Pulse Generator Implant Date: 20201008

## 2019-12-09 ENCOUNTER — Ambulatory Visit (INDEPENDENT_AMBULATORY_CARE_PROVIDER_SITE_OTHER): Payer: Medicare HMO | Admitting: *Deleted

## 2019-12-09 DIAGNOSIS — G459 Transient cerebral ischemic attack, unspecified: Secondary | ICD-10-CM | POA: Diagnosis not present

## 2019-12-09 LAB — CUP PACEART REMOTE DEVICE CHECK
Date Time Interrogation Session: 20210701230524
Implantable Pulse Generator Implant Date: 20201008

## 2019-12-13 ENCOUNTER — Encounter: Payer: Self-pay | Admitting: Adult Health

## 2019-12-13 NOTE — Progress Notes (Signed)
Carelink Summary Report / Loop Recorder 

## 2019-12-22 ENCOUNTER — Ambulatory Visit: Payer: Medicare HMO | Admitting: Adult Health

## 2020-01-16 ENCOUNTER — Ambulatory Visit (INDEPENDENT_AMBULATORY_CARE_PROVIDER_SITE_OTHER): Payer: Medicare HMO | Admitting: *Deleted

## 2020-01-16 DIAGNOSIS — G459 Transient cerebral ischemic attack, unspecified: Secondary | ICD-10-CM

## 2020-01-16 LAB — CUP PACEART REMOTE DEVICE CHECK
Date Time Interrogation Session: 20210803230420
Implantable Pulse Generator Implant Date: 20201008

## 2020-01-17 NOTE — Progress Notes (Signed)
Carelink Summary Report / Loop Recorder 

## 2020-02-15 LAB — CUP PACEART REMOTE DEVICE CHECK
Date Time Interrogation Session: 20210905230336
Implantable Pulse Generator Implant Date: 20201008

## 2020-02-20 ENCOUNTER — Ambulatory Visit (INDEPENDENT_AMBULATORY_CARE_PROVIDER_SITE_OTHER): Payer: Medicare HMO | Admitting: *Deleted

## 2020-02-20 DIAGNOSIS — G459 Transient cerebral ischemic attack, unspecified: Secondary | ICD-10-CM | POA: Diagnosis not present

## 2020-02-21 NOTE — Progress Notes (Signed)
Carelink Summary Report / Loop Recorder 

## 2020-03-17 LAB — CUP PACEART REMOTE DEVICE CHECK
Date Time Interrogation Session: 20211008230126
Implantable Pulse Generator Implant Date: 20201008

## 2020-03-20 ENCOUNTER — Other Ambulatory Visit: Payer: Self-pay | Admitting: Endocrinology

## 2020-03-20 DIAGNOSIS — E041 Nontoxic single thyroid nodule: Secondary | ICD-10-CM

## 2020-03-26 ENCOUNTER — Ambulatory Visit (INDEPENDENT_AMBULATORY_CARE_PROVIDER_SITE_OTHER): Payer: Medicare HMO

## 2020-03-26 DIAGNOSIS — G459 Transient cerebral ischemic attack, unspecified: Secondary | ICD-10-CM | POA: Diagnosis not present

## 2020-03-27 ENCOUNTER — Ambulatory Visit
Admission: RE | Admit: 2020-03-27 | Discharge: 2020-03-27 | Disposition: A | Discharge status: Home / Self Care | Payer: Medicare HMO | Source: Ambulatory Visit | Attending: Endocrinology | Admitting: Endocrinology

## 2020-03-27 DIAGNOSIS — E041 Nontoxic single thyroid nodule: Secondary | ICD-10-CM

## 2020-04-02 NOTE — Progress Notes (Signed)
Carelink Summary Report / Loop Recorder 

## 2020-04-08 IMAGING — MR MR HEAD W/O CM
12 of 13 series · 43 of 48 positions shown · non-contrast
Comparison: Brain MRI 03/15/2019

CLINICAL DATA: Right-sided weakness and speech abnormality.

EXAM:
MRI HEAD WITHOUT CONTRAST
TECHNIQUE: Multiplanar, multiecho pulse sequences of the brain and surrounding
structures were obtained without intravenous contrast.

[Series 9: DWI · axial · 3.0mm · 0.88mm/px · z∈[-125,+4]mm · 7 of 90 slices shown (1 of 4)]
[im 1/90]
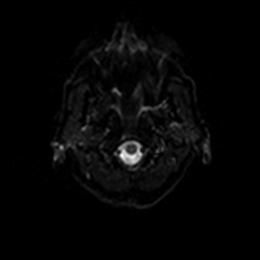
[im 15/90]
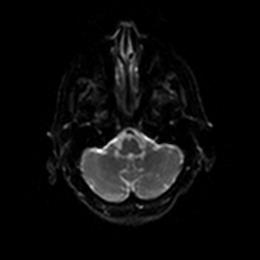
[im 30/90]
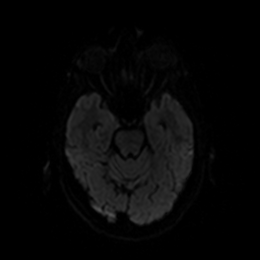
[im 45/90]
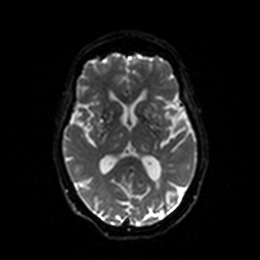
[im 60/90]
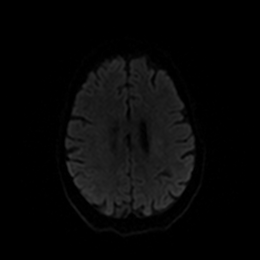
[im 75/90]
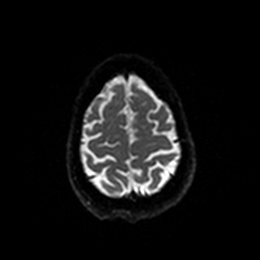
[im 90/90]
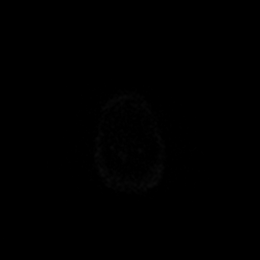

[Series 10: DWI · axial · 3.0mm · 0.88mm/px · z∈[-125,+4]mm · 3 of 43 slices shown (2 of 4)]
[im 1/43]
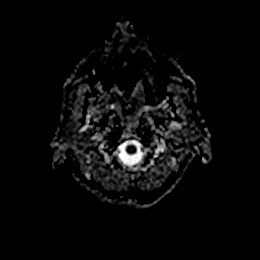
[im 22/43]
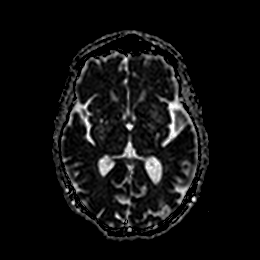
[im 43/43]
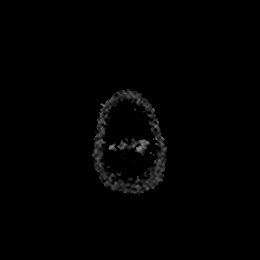

[Series 11: DWI · coronal · 4.0mm · 0.88mm/px · 5 of 64 slices shown (3 of 4)]
[im 1/64]
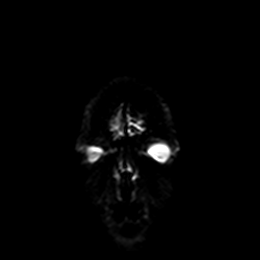
[im 16/64]
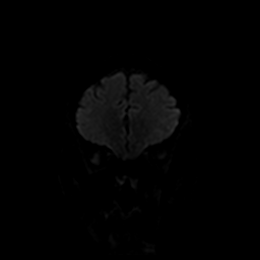
[im 32/64]
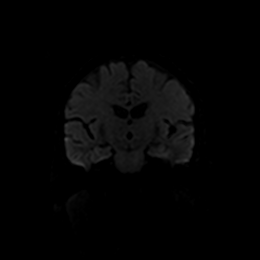
[im 48/64]
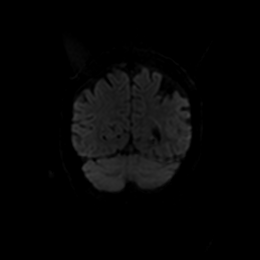
[im 64/64]
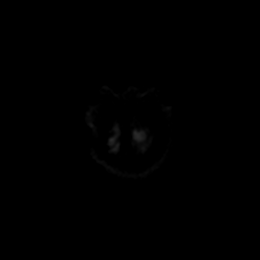

[Series 12: DWI · coronal · 4.0mm · 0.88mm/px · 2 of 32 slices shown (4 of 4)]
[im 1/32]
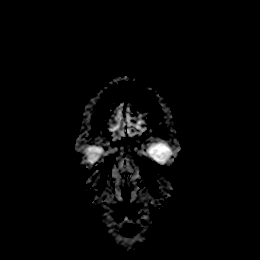
[im 32/32]
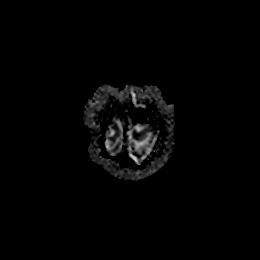

[Series 13: T1 · sagittal · 5.0mm · 0.75mm/px · 2 of 23 slices shown]
[im 1/23]
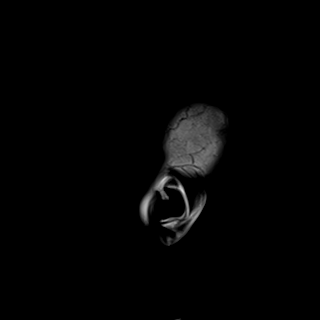
[im 23/23]
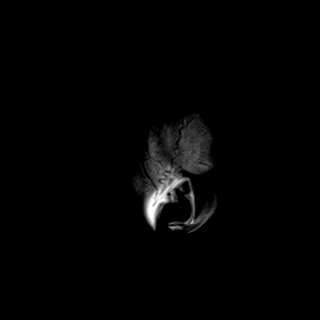

[Series 14: T2 · axial · 5.0mm · 0.72mm/px · z∈[-136,+6]mm · 2 of 25 slices shown (1 of 2)]
[im 1/25]
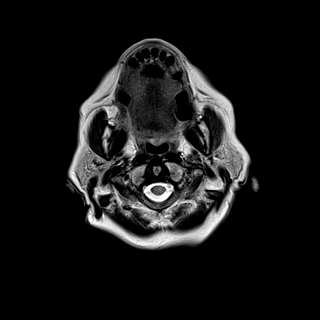
[im 25/25]
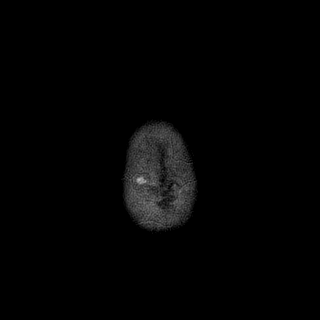

[Series 15: FLAIR · axial · 5.0mm · 0.45mm/px · z∈[-136,+5]mm · 2 of 25 slices shown]
[im 1/25]
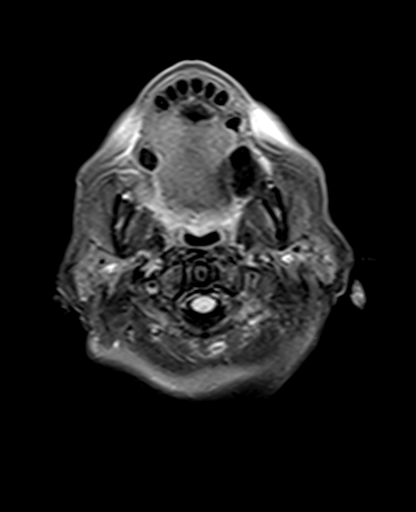
[im 25/25]
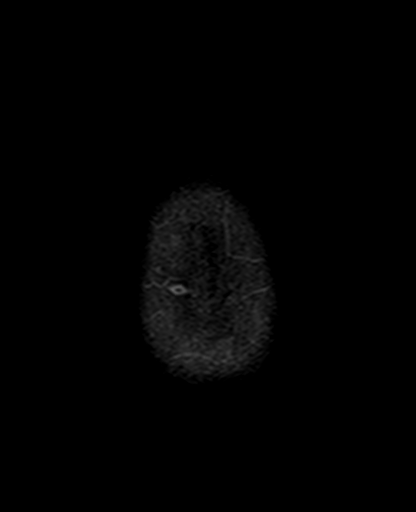

[Series 16: mag_images · axial · 3.0mm · 0.90mm/px · z∈[-152,+21]mm · 5 of 60 slices shown]
[im 1/60]
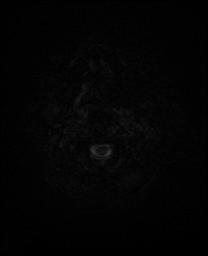
[im 15/60]
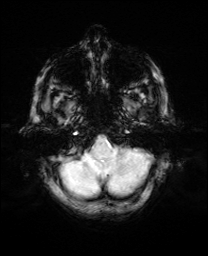
[im 30/60]
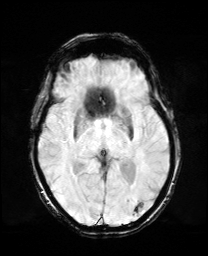
[im 45/60]
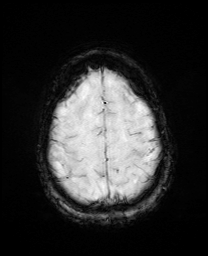
[im 60/60]
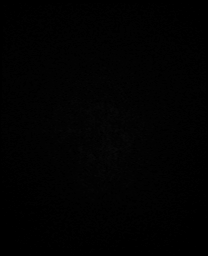

[Series 17: pha_images · axial · 3.0mm · 0.90mm/px · z∈[-152,+16]mm · 4 of 56 slices shown]
[im 1/56]
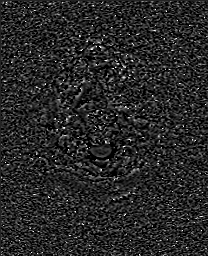
[im 19/56]
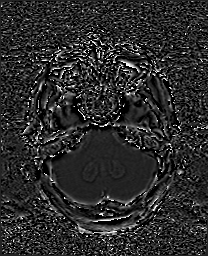
[im 37/56]
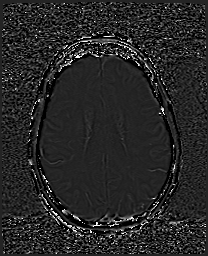
[im 56/56]
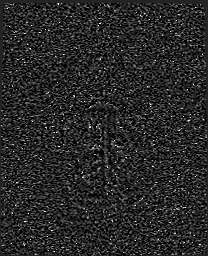

[Series 18: swi_images · axial · 3.0mm · 0.90mm/px · z∈[-152,+21]mm · 5 of 60 slices shown]
[im 1/60]
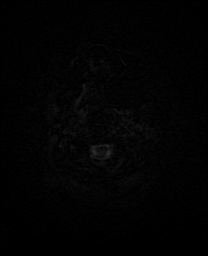
[im 15/60]
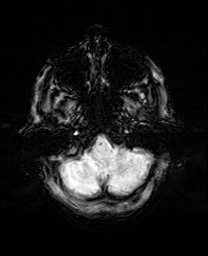
[im 30/60]
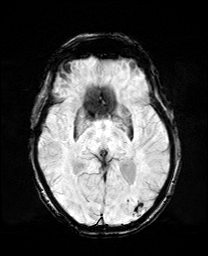
[im 45/60]
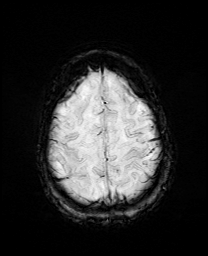
[im 60/60]
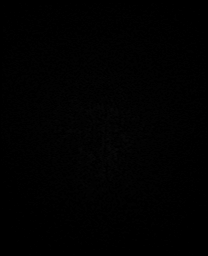

[Series 19: mip_images(sw) · axial · 24.0mm · 0.90mm/px · z∈[-142,+11]mm · 4 of 53 slices shown]
[im 1/53]
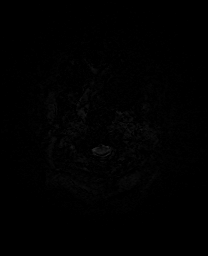
[im 18/53]
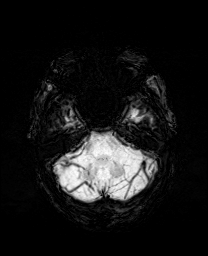
[im 35/53]
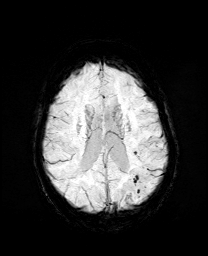
[im 53/53]
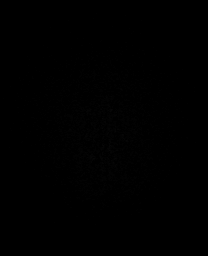

[Series 21: T2 · coronal · 5.0mm · 0.34mm/px · 2 of 29 slices shown (2 of 2)]
[im 1/29]
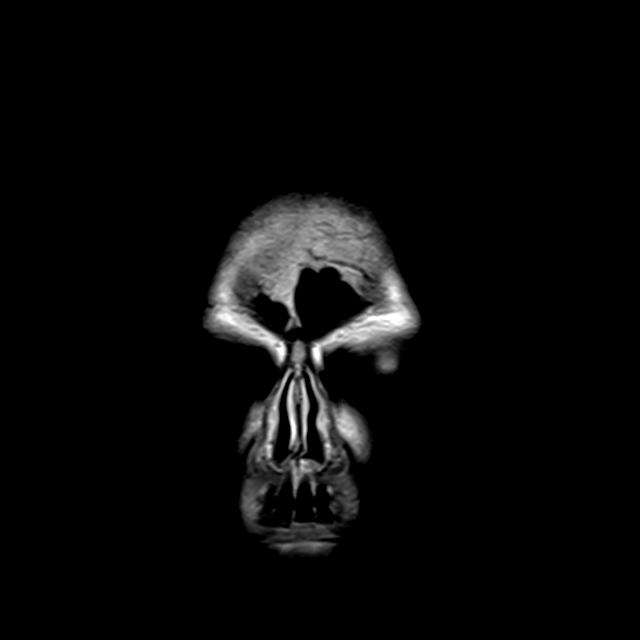
[im 29/29]
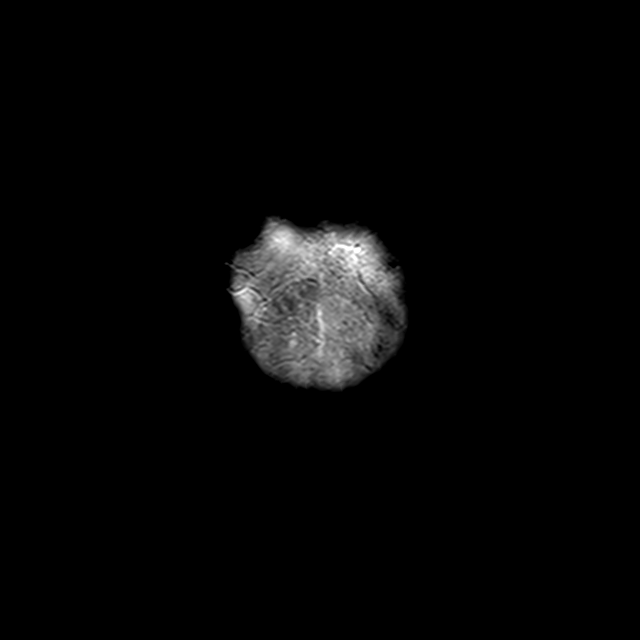

[43 of 48 positions shown; findings below may reference images not displayed]

FINDINGS: Brain: There is no acute infarct, acute hemorrhage or extra-axial
collection. Early confluent hyperintense T2-weighted signal of the
periventricular and deep white matter, most commonly due to chronic
ischemic microangiopathy. There is an old posterior left
parietal/temporal infarct. There is generalized atrophy without
lobar predilection. Blood-sensitive sequences show multifocal
chronic microhemorrhage and hemosiderin deposition in the posterior
left MCA territory. The midline structures are normal.

Vascular: Normal flow voids at the skull base

Skull and upper cervical spine: The bone marrow signal of the
cranium and upper cervical vertebrae is normal. There is no skull
base lesion. The visualized upper cervical spinal cord is normal.

Sinuses/Orbits: There is no paranasal sinus fluid level or advanced
mucosal thickening. There is no mastoid or middle ear effusion. The
orbits are normal.

Other: None
IMPRESSION: 1. No acute intracranial abnormality.
2. Generalized atrophy and chronic ischemic microangiopathy.
3. Old left posterior parietal/temporal infarct.

## 2020-04-30 ENCOUNTER — Ambulatory Visit (INDEPENDENT_AMBULATORY_CARE_PROVIDER_SITE_OTHER): Payer: Medicare HMO

## 2020-04-30 DIAGNOSIS — G459 Transient cerebral ischemic attack, unspecified: Secondary | ICD-10-CM

## 2020-04-30 LAB — CUP PACEART REMOTE DEVICE CHECK
Date Time Interrogation Session: 20211121230702
Implantable Pulse Generator Implant Date: 20201008

## 2020-05-07 NOTE — Progress Notes (Signed)
Carelink Summary Report / Loop Recorder 

## 2020-06-04 ENCOUNTER — Ambulatory Visit (INDEPENDENT_AMBULATORY_CARE_PROVIDER_SITE_OTHER): Payer: Medicare HMO

## 2020-06-04 DIAGNOSIS — G459 Transient cerebral ischemic attack, unspecified: Secondary | ICD-10-CM | POA: Diagnosis not present

## 2020-06-05 LAB — CUP PACEART REMOTE DEVICE CHECK
Date Time Interrogation Session: 20211224230105
Implantable Pulse Generator Implant Date: 20201008

## 2020-06-15 NOTE — Progress Notes (Signed)
Carelink Summary Report / Loop Recorder 

## 2020-07-05 LAB — CUP PACEART REMOTE DEVICE CHECK
Date Time Interrogation Session: 20220126230709
Implantable Pulse Generator Implant Date: 20201008

## 2020-07-09 ENCOUNTER — Ambulatory Visit (INDEPENDENT_AMBULATORY_CARE_PROVIDER_SITE_OTHER): Payer: Medicare HMO

## 2020-07-09 DIAGNOSIS — G459 Transient cerebral ischemic attack, unspecified: Secondary | ICD-10-CM | POA: Diagnosis not present

## 2020-07-18 NOTE — Progress Notes (Signed)
Carelink Summary Report / Loop Recorder 

## 2020-08-13 ENCOUNTER — Ambulatory Visit (INDEPENDENT_AMBULATORY_CARE_PROVIDER_SITE_OTHER): Payer: Medicare HMO

## 2020-08-13 DIAGNOSIS — G459 Transient cerebral ischemic attack, unspecified: Secondary | ICD-10-CM

## 2020-08-15 LAB — CUP PACEART REMOTE DEVICE CHECK
Date Time Interrogation Session: 20220228230800
Implantable Pulse Generator Implant Date: 20201008

## 2020-08-22 NOTE — Progress Notes (Signed)
Carelink Summary Report / Loop Recorder 

## 2020-09-15 LAB — CUP PACEART REMOTE DEVICE CHECK
Date Time Interrogation Session: 20220402230726
Implantable Pulse Generator Implant Date: 20201008

## 2020-09-17 ENCOUNTER — Ambulatory Visit (INDEPENDENT_AMBULATORY_CARE_PROVIDER_SITE_OTHER): Payer: Medicare HMO

## 2020-09-17 DIAGNOSIS — G459 Transient cerebral ischemic attack, unspecified: Secondary | ICD-10-CM

## 2020-10-02 NOTE — Progress Notes (Signed)
Carelink Summary Report / Loop Recorder 

## 2020-10-22 ENCOUNTER — Ambulatory Visit (INDEPENDENT_AMBULATORY_CARE_PROVIDER_SITE_OTHER): Payer: Medicare HMO

## 2020-10-22 DIAGNOSIS — G459 Transient cerebral ischemic attack, unspecified: Secondary | ICD-10-CM | POA: Diagnosis not present

## 2020-10-24 LAB — CUP PACEART REMOTE DEVICE CHECK
Date Time Interrogation Session: 20220514230606
Implantable Pulse Generator Implant Date: 20201008

## 2020-11-14 NOTE — Progress Notes (Signed)
Carelink Summary Report / Loop Recorder 

## 2020-11-26 ENCOUNTER — Ambulatory Visit (INDEPENDENT_AMBULATORY_CARE_PROVIDER_SITE_OTHER): Payer: Medicare HMO

## 2020-11-26 DIAGNOSIS — G459 Transient cerebral ischemic attack, unspecified: Secondary | ICD-10-CM | POA: Diagnosis not present

## 2020-11-26 LAB — CUP PACEART REMOTE DEVICE CHECK
Date Time Interrogation Session: 20220616230339
Implantable Pulse Generator Implant Date: 20201008

## 2020-12-17 NOTE — Progress Notes (Signed)
Carelink Summary Report / Loop Recorder 

## 2020-12-27 LAB — CUP PACEART REMOTE DEVICE CHECK
Date Time Interrogation Session: 20220719230643
Implantable Pulse Generator Implant Date: 20201008

## 2020-12-31 ENCOUNTER — Ambulatory Visit (INDEPENDENT_AMBULATORY_CARE_PROVIDER_SITE_OTHER): Payer: Medicare HMO

## 2020-12-31 DIAGNOSIS — G459 Transient cerebral ischemic attack, unspecified: Secondary | ICD-10-CM

## 2021-01-25 NOTE — Progress Notes (Signed)
Carelink Summary Report / Loop Recorder 

## 2021-02-04 ENCOUNTER — Ambulatory Visit (INDEPENDENT_AMBULATORY_CARE_PROVIDER_SITE_OTHER): Payer: Medicare HMO

## 2021-02-04 DIAGNOSIS — Z950 Presence of cardiac pacemaker: Secondary | ICD-10-CM | POA: Diagnosis not present

## 2021-02-04 DIAGNOSIS — G459 Transient cerebral ischemic attack, unspecified: Secondary | ICD-10-CM

## 2021-02-05 LAB — CUP PACEART REMOTE DEVICE CHECK
Date Time Interrogation Session: 20220821230336
Implantable Pulse Generator Implant Date: 20201008

## 2021-02-15 NOTE — Progress Notes (Signed)
Remote pacemaker transmission.   

## 2021-03-11 ENCOUNTER — Ambulatory Visit (INDEPENDENT_AMBULATORY_CARE_PROVIDER_SITE_OTHER): Payer: Medicare HMO

## 2021-03-11 DIAGNOSIS — G459 Transient cerebral ischemic attack, unspecified: Secondary | ICD-10-CM | POA: Diagnosis not present

## 2021-03-11 LAB — CUP PACEART REMOTE DEVICE CHECK
Date Time Interrogation Session: 20220923230602
Implantable Pulse Generator Implant Date: 20201008

## 2021-03-19 NOTE — Progress Notes (Signed)
Carelink Summary Report / Loop Recorder 

## 2021-04-04 LAB — CUP PACEART REMOTE DEVICE CHECK
Date Time Interrogation Session: 20221026230957
Implantable Pulse Generator Implant Date: 20201008

## 2021-04-15 ENCOUNTER — Ambulatory Visit (INDEPENDENT_AMBULATORY_CARE_PROVIDER_SITE_OTHER): Payer: Medicare HMO

## 2021-04-15 DIAGNOSIS — G459 Transient cerebral ischemic attack, unspecified: Secondary | ICD-10-CM

## 2021-04-23 NOTE — Progress Notes (Signed)
Carelink Summary Report / Loop Recorder 

## 2021-05-20 ENCOUNTER — Ambulatory Visit (INDEPENDENT_AMBULATORY_CARE_PROVIDER_SITE_OTHER): Payer: Medicare HMO

## 2021-05-20 DIAGNOSIS — G459 Transient cerebral ischemic attack, unspecified: Secondary | ICD-10-CM | POA: Diagnosis not present

## 2021-05-21 LAB — CUP PACEART REMOTE DEVICE CHECK
Date Time Interrogation Session: 20221128230615
Implantable Pulse Generator Implant Date: 20201008

## 2021-05-30 NOTE — Progress Notes (Signed)
Carelink Summary Report / Loop Recorder 

## 2021-06-24 ENCOUNTER — Ambulatory Visit (INDEPENDENT_AMBULATORY_CARE_PROVIDER_SITE_OTHER): Payer: Medicare HMO

## 2021-06-24 DIAGNOSIS — G459 Transient cerebral ischemic attack, unspecified: Secondary | ICD-10-CM | POA: Diagnosis not present

## 2021-06-25 LAB — CUP PACEART REMOTE DEVICE CHECK
Date Time Interrogation Session: 20230115230806
Implantable Pulse Generator Implant Date: 20201008

## 2021-07-05 NOTE — Progress Notes (Signed)
Carelink Summary Report / Loop Recorder 

## 2021-07-27 LAB — CUP PACEART REMOTE DEVICE CHECK
Date Time Interrogation Session: 20230217230429
Implantable Pulse Generator Implant Date: 20201008

## 2021-07-29 ENCOUNTER — Ambulatory Visit (INDEPENDENT_AMBULATORY_CARE_PROVIDER_SITE_OTHER): Payer: Medicare HMO

## 2021-07-29 DIAGNOSIS — G459 Transient cerebral ischemic attack, unspecified: Secondary | ICD-10-CM | POA: Diagnosis not present

## 2021-08-02 NOTE — Progress Notes (Signed)
Carelink Summary Report / Loop Recorder 

## 2021-09-02 ENCOUNTER — Ambulatory Visit (INDEPENDENT_AMBULATORY_CARE_PROVIDER_SITE_OTHER): Payer: Medicare HMO

## 2021-09-02 DIAGNOSIS — G459 Transient cerebral ischemic attack, unspecified: Secondary | ICD-10-CM

## 2021-09-03 LAB — CUP PACEART REMOTE DEVICE CHECK
Date Time Interrogation Session: 20230326230552
Implantable Pulse Generator Implant Date: 20201008

## 2021-09-12 NOTE — Progress Notes (Signed)
Carelink Summary Report / Loop Recorder 

## 2021-10-07 ENCOUNTER — Ambulatory Visit (INDEPENDENT_AMBULATORY_CARE_PROVIDER_SITE_OTHER): Payer: Medicare HMO

## 2021-10-07 DIAGNOSIS — G459 Transient cerebral ischemic attack, unspecified: Secondary | ICD-10-CM | POA: Diagnosis not present

## 2021-10-07 LAB — CUP PACEART REMOTE DEVICE CHECK
Date Time Interrogation Session: 20230428230546
Implantable Pulse Generator Implant Date: 20201008

## 2021-10-21 NOTE — Progress Notes (Signed)
Carelink Summary Report / Loop Recorder 

## 2021-11-11 ENCOUNTER — Ambulatory Visit (INDEPENDENT_AMBULATORY_CARE_PROVIDER_SITE_OTHER): Payer: Medicare HMO

## 2021-11-11 DIAGNOSIS — G459 Transient cerebral ischemic attack, unspecified: Secondary | ICD-10-CM

## 2021-11-12 LAB — CUP PACEART REMOTE DEVICE CHECK
Date Time Interrogation Session: 20230531230840
Implantable Pulse Generator Implant Date: 20201008

## 2021-11-20 ENCOUNTER — Ambulatory Visit: Payer: Medicare HMO | Admitting: Neurology

## 2021-11-27 NOTE — Progress Notes (Signed)
Carelink Summary Report / Loop Recorder 

## 2021-12-14 LAB — CUP PACEART REMOTE DEVICE CHECK
Date Time Interrogation Session: 20230703230507
Implantable Pulse Generator Implant Date: 20201008

## 2021-12-16 ENCOUNTER — Ambulatory Visit (INDEPENDENT_AMBULATORY_CARE_PROVIDER_SITE_OTHER): Payer: Medicare HMO

## 2021-12-16 DIAGNOSIS — G459 Transient cerebral ischemic attack, unspecified: Secondary | ICD-10-CM

## 2022-01-09 NOTE — Progress Notes (Signed)
Carelink Summary Report / Loop Recorder 

## 2022-01-16 LAB — CUP PACEART REMOTE DEVICE CHECK
Date Time Interrogation Session: 20230805230848
Implantable Pulse Generator Implant Date: 20201008

## 2022-02-24 ENCOUNTER — Ambulatory Visit (INDEPENDENT_AMBULATORY_CARE_PROVIDER_SITE_OTHER): Payer: Medicare HMO

## 2022-02-24 DIAGNOSIS — G459 Transient cerebral ischemic attack, unspecified: Secondary | ICD-10-CM

## 2022-02-25 LAB — CUP PACEART REMOTE DEVICE CHECK
Date Time Interrogation Session: 20230917231322
Implantable Pulse Generator Implant Date: 20201008

## 2022-02-26 ENCOUNTER — Telehealth: Payer: Self-pay | Admitting: Internal Medicine

## 2022-02-26 NOTE — Telephone Encounter (Signed)
Daughter called stating patient has moved to [facility].

## 2022-02-26 NOTE — Telephone Encounter (Signed)
Attempted to return call. No answer, LMTCB. 

## 2022-02-27 NOTE — Telephone Encounter (Signed)
Patient has ILR and will continue to monitor. Advised we will call patient if something is seen on ILR report. Appreciative of call.

## 2022-03-11 NOTE — Progress Notes (Signed)
Carelink Summary Report / Loop Recorder 

## 2022-03-31 ENCOUNTER — Ambulatory Visit (INDEPENDENT_AMBULATORY_CARE_PROVIDER_SITE_OTHER): Payer: Medicare HMO

## 2022-03-31 DIAGNOSIS — G459 Transient cerebral ischemic attack, unspecified: Secondary | ICD-10-CM | POA: Diagnosis not present

## 2022-04-02 LAB — CUP PACEART REMOTE DEVICE CHECK
Date Time Interrogation Session: 20231022232320
Implantable Pulse Generator Implant Date: 20201008

## 2022-04-29 NOTE — Progress Notes (Signed)
Carelink Summary Report / Loop Recorder
